# Patient Record
Sex: Female | Born: 1995 | Race: Black or African American | Hispanic: No | Marital: Single | Smoking: Former smoker
Health system: Southern US, Community
[De-identification: ages and names within clinical notes are randomized; demographics above are authoritative.]

## PROBLEM LIST (undated history)

## (undated) DIAGNOSIS — R569 Unspecified convulsions: Secondary | ICD-10-CM

## (undated) DIAGNOSIS — T7421XA Adult sexual abuse, confirmed, initial encounter: Secondary | ICD-10-CM

## (undated) DIAGNOSIS — R6250 Unspecified lack of expected normal physiological development in childhood: Secondary | ICD-10-CM

## (undated) DIAGNOSIS — F71 Moderate intellectual disabilities: Secondary | ICD-10-CM

## (undated) DIAGNOSIS — K219 Gastro-esophageal reflux disease without esophagitis: Secondary | ICD-10-CM

## (undated) DIAGNOSIS — F4310 Post-traumatic stress disorder, unspecified: Secondary | ICD-10-CM

## (undated) DIAGNOSIS — F319 Bipolar disorder, unspecified: Secondary | ICD-10-CM

## (undated) DIAGNOSIS — R456 Violent behavior: Secondary | ICD-10-CM

## (undated) DIAGNOSIS — R0600 Dyspnea, unspecified: Secondary | ICD-10-CM

## (undated) DIAGNOSIS — F32A Depression, unspecified: Secondary | ICD-10-CM

## (undated) DIAGNOSIS — Z8489 Family history of other specified conditions: Secondary | ICD-10-CM

## (undated) DIAGNOSIS — F909 Attention-deficit hyperactivity disorder, unspecified type: Secondary | ICD-10-CM

## (undated) DIAGNOSIS — I10 Essential (primary) hypertension: Secondary | ICD-10-CM

## (undated) DIAGNOSIS — R011 Cardiac murmur, unspecified: Secondary | ICD-10-CM

## (undated) DIAGNOSIS — R519 Headache, unspecified: Secondary | ICD-10-CM

## (undated) DIAGNOSIS — F419 Anxiety disorder, unspecified: Secondary | ICD-10-CM

## (undated) HISTORY — DX: Adult sexual abuse, confirmed, initial encounter: T74.21XA

## (undated) HISTORY — DX: Unspecified convulsions: R56.9

## (undated) HISTORY — DX: Depression, unspecified: F32.A

## (undated) HISTORY — DX: Post-traumatic stress disorder, unspecified: F43.10

## (undated) HISTORY — DX: Violent behavior: R45.6

## (undated) HISTORY — DX: Unspecified lack of expected normal physiological development in childhood: R62.50

---
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
## 2016-01-21 ENCOUNTER — Other Ambulatory Visit: Payer: Self-pay | Admitting: Family Medicine

## 2016-02-02 ENCOUNTER — Ambulatory Visit (HOSPITAL_BASED_OUTPATIENT_CLINIC_OR_DEPARTMENT_OTHER): Payer: Medicaid Other | Attending: Family Medicine | Admitting: Internal Medicine

## 2016-02-02 DIAGNOSIS — R0683 Snoring: Secondary | ICD-10-CM

## 2016-02-09 ENCOUNTER — Encounter (HOSPITAL_BASED_OUTPATIENT_CLINIC_OR_DEPARTMENT_OTHER): Payer: Self-pay
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
## 2016-02-11 NOTE — Procedures (Signed)
  Patient Name: Lisa [name], Lisa [name] Study Date: 02/02/2016 Gender: Female D.O.B: 03-15-1995 Age (years): 20 ReferrinGaspar Colag Provider: Payton MccallumJeffrey Walden Height (inches): 62 Interpreting Physician: Jetty Duhamellinton Skylyn Slezak MD, ABSM Weight (lbs): 221 RPSGT: Rolene ArbourMcConnico, Yvonne BMI: 40 MRN: [id number] Neck Size: 15.00 CLINICAL INFORMATION Sleep Study Type: NPSG  Indication for sleep study: Snoring  Epworth Sleepiness Score: 22  SLEEP STUDY TECHNIQUE As per the AASM Manual for the Scoring of Sleep and Associated Events v2.3 (April 2016) with a hypopnea requiring 4% desaturations.  The channels recorded and monitored were frontal, central and occipital EEG, electrooculogram (EOG), submentalis EMG (chin), nasal and oral airflow, thoracic and abdominal wall motion, anterior tibialis EMG, snore microphone, electrocardiogram, and pulse oximetry.  MEDICATIONS Medications self-administered by patient taken the night of the study : none reported  SLEEP ARCHITECTURE The study was initiated at 9:25:17 PM and ended at 4:34:14 AM.  Sleep onset time was 10.8 minutes and the sleep efficiency was 90.5%. The total sleep time was 388.1 minutes.  Stage REM latency was 283.0 minutes.  The patient spent 3.74% of the night in stage N1 sleep, 86.86% in stage N2 sleep, 2.32% in stage N3 and 7.09% in REM.  Alpha intrusion was absent.  Supine sleep was 77.84%.  RESPIRATORY PARAMETERS The overall apnea/hypopnea index (AHI) was 0.5 per hour. There were 0 total apneas, including 0 obstructive, 0 central and 0 mixed apneas. There were 3 hypopneas and 1 RERAs.  The AHI during Stage REM sleep was 2.2 per hour.  AHI while supine was 0.6 per hour.  The mean oxygen saturation was 97.86%. The minimum SpO2 during sleep was 90.00%.  Soft snoring was noted during this study.  CARDIAC DATA The 2 lead EKG demonstrated sinus rhythm. The mean heart rate was 82.37 beats per minute. Other EKG findings include: None. LEG MOVEMENT  DATA The total PLMS were 0 with a resulting PLMS index of 0.00. Associated arousal with leg movement index was 0.0 .  IMPRESSIONS - No significant obstructive sleep apnea occurred during this study (AHI = 0.5/h). - No significant central sleep apnea occurred during this study (CAI = 0.0/h). - Mild oxygen desaturation was noted during this study (Min O2 = 90.00%). - The patient snored with Soft snoring volume. - No cardiac abnormalities were noted during this study. - Clinically significant periodic limb movements did not occur during sleep. No significant associated arousals.  DIAGNOSIS - Normal study  RECOMMENDATIONS - Avoid alcohol, sedatives and other CNS depressants that may worsen sleep apnea and disrupt normal sleep architecture. - Sleep hygiene should be reviewed to assess factors that may improve sleep quality. - Weight management and regular exercise should be initiated or continued if appropriate.  [Electronically signed] 02/11/2016 10:36 AM  Jetty Duhamellinton Crixus Mcaulay MD, ABSM Diplomate, American Board of Sleep Medicine   NPI: [id number]  Waymon BudgeYOUNG,Nakima Fluegge D Diplomate, American Board of Sleep Medicine  ELECTRONICALLY SIGNED ON:  02/11/2016, 10:34 AM [facility] PH: [phone]   FX: [phone] ACCREDITED BY THE AMERICAN ACADEMY OF SLEEP MEDICINE

## 2016-02-12 DIAGNOSIS — R0683 Snoring: Secondary | ICD-10-CM | POA: Diagnosis not present

## 2016-02-13 ENCOUNTER — Ambulatory Visit (INDEPENDENT_AMBULATORY_CARE_PROVIDER_SITE_OTHER): Payer: Medicaid Other | Admitting: Psychiatry

## 2016-02-13 VITALS — BP 126/74 | HR 102 | Ht 62.0 in | Wt 232.0 lb

## 2016-02-13 DIAGNOSIS — Z79899 Other long term (current) drug therapy: Secondary | ICD-10-CM

## 2016-02-13 DIAGNOSIS — F4310 Post-traumatic stress disorder, unspecified: Secondary | ICD-10-CM | POA: Diagnosis not present

## 2016-02-13 DIAGNOSIS — Z91013 Allergy to seafood: Secondary | ICD-10-CM

## 2016-02-13 DIAGNOSIS — Z825 Family history of asthma and other chronic lower respiratory diseases: Secondary | ICD-10-CM | POA: Diagnosis not present

## 2016-02-13 DIAGNOSIS — F331 Major depressive disorder, recurrent, moderate: Secondary | ICD-10-CM

## 2016-02-13 DIAGNOSIS — Z9889 Other specified postprocedural states: Secondary | ICD-10-CM | POA: Diagnosis not present

## 2016-02-13 DIAGNOSIS — Z888 Allergy status to other drugs, medicaments and biological substances status: Secondary | ICD-10-CM

## 2016-02-13 MED ORDER — QUETIAPINE FUMARATE 25 MG PO TABS: ORAL_TABLET | ORAL | 1 refills | Status: DC

## 2016-02-13 MED ORDER — SERTRALINE HCL 100 MG PO TABS: 100.0000 mg | ORAL_TABLET | Freq: Every day | ORAL | 1 refills | Status: DC

## 2016-02-13 NOTE — Patient Instructions (Addendum)
1. Increase sertraline 100 mg daily 2. Decrease quetiapine 25 mg at night and daily as needed for anxiety, agitation 3. Return to clinic in one month

## 2016-02-28 ENCOUNTER — Ambulatory Visit (INDEPENDENT_AMBULATORY_CARE_PROVIDER_SITE_OTHER): Payer: Medicaid Other | Admitting: Family Medicine

## 2016-02-28 ENCOUNTER — Encounter: Payer: Self-pay | Admitting: Family Medicine

## 2016-02-28 VITALS — BP 110/64 | HR 93 | Temp 98.1°F | Ht 62.0 in | Wt 229.8 lb

## 2016-02-28 DIAGNOSIS — R32 Unspecified urinary incontinence: Secondary | ICD-10-CM | POA: Diagnosis not present

## 2016-02-28 DIAGNOSIS — R0683 Snoring: Secondary | ICD-10-CM | POA: Diagnosis present

## 2016-02-28 DIAGNOSIS — F339 Major depressive disorder, recurrent, unspecified: Secondary | ICD-10-CM | POA: Diagnosis not present

## 2016-02-28 MED ORDER — IBUPROFEN 600 MG PO TABS: 600.0000 mg | ORAL_TABLET | Freq: Three times a day (TID) | ORAL | 2 refills | Status: DC | PRN

## 2016-02-28 MED ORDER — ALBUTEROL SULFATE HFA 108 (90 BASE) MCG/ACT IN AERS: 2.0000 | INHALATION_SPRAY | Freq: Four times a day (QID) | RESPIRATORY_TRACT | 1 refills | Status: DC | PRN

## 2016-02-28 NOTE — Patient Instructions (Signed)
Take the Ibuprofen every 8 hours as needed for your ankle.  Also use ice.  Let me know if it's not better in the next week or so.    Use the Albuterol at night if you're having trouble breathing.  It was very good to see you today!
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
## 2016-03-15 ENCOUNTER — Other Ambulatory Visit (HOSPITAL_COMMUNITY): Payer: Self-pay | Admitting: Family Medicine

## 2016-03-19 ENCOUNTER — Ambulatory Visit (HOSPITAL_COMMUNITY): Payer: Self-pay | Admitting: Psychiatry

## 2016-03-20 ENCOUNTER — Ambulatory Visit (HOSPITAL_COMMUNITY): Payer: Self-pay | Admitting: Psychiatry

## 2016-03-26 ENCOUNTER — Ambulatory Visit: Payer: Medicaid Other | Admitting: Family Medicine

## 2016-03-27 ENCOUNTER — Ambulatory Visit: Payer: Medicaid Other | Admitting: Family Medicine

## 2016-06-14 ENCOUNTER — Other Ambulatory Visit (HOSPITAL_COMMUNITY): Payer: Self-pay | Admitting: Psychiatry

## 2016-06-14 ENCOUNTER — Telehealth (HOSPITAL_COMMUNITY): Payer: Self-pay | Admitting: *Deleted

## 2016-06-14 MED ORDER — SERTRALINE HCL 100 MG PO TABS: 100.0000 mg | ORAL_TABLET | Freq: Every day | ORAL | 0 refills | Status: DC

## 2016-06-14 NOTE — Telephone Encounter (Signed)
noted 

## 2016-06-14 NOTE — Telephone Encounter (Signed)
[facility] [place] [facility] request for pt Sertraline HCL 100 mg QD. Pt medication last filled on 02-13-2016 with 30 tabs 1 refill. Pt last appt was 02-13-2016. Per pt chart, it looks like she resch appt with Dr. Rene KocherEksir but she no showed and never resch. Called pt number on file to see which provider she was going to resch with but number is d/c or not in service because call is not going through.

## 2016-06-14 NOTE — Telephone Encounter (Signed)
Ordered only for a month. No more refill unless she has a follow up appointment, fyi.

## 2016-06-18 ENCOUNTER — Telehealth: Payer: Self-pay | Admitting: Family Medicine

## 2016-06-18 DIAGNOSIS — F71 Moderate intellectual disabilities: Secondary | ICD-10-CM

## 2016-06-18 DIAGNOSIS — F331 Major depressive disorder, recurrent, moderate: Secondary | ICD-10-CM

## 2016-06-18 DIAGNOSIS — F4310 Post-traumatic stress disorder, unspecified: Secondary | ICD-10-CM

## 2016-06-18 NOTE — Telephone Encounter (Signed)
Pt needs refills on epipen, albutrol inhaler and solution, epi pen, abilify, zyurtec, flonase, singular, omeprazole and seraquel.  [facility] [address]

## 2016-06-19 MED ORDER — CETIRIZINE HCL 10 MG PO TABS: 10.0000 mg | ORAL_TABLET | Freq: Every day | ORAL | 11 refills | Status: DC

## 2016-06-19 MED ORDER — FLUTICASONE PROPIONATE 50 MCG/ACT NA SUSP: NASAL | 2 refills | Status: DC

## 2016-06-19 MED ORDER — ARIPIPRAZOLE 20 MG PO TABS: 20.0000 mg | ORAL_TABLET | Freq: Every day | ORAL | 1 refills | Status: DC

## 2016-06-19 MED ORDER — ALBUTEROL SULFATE HFA 108 (90 BASE) MCG/ACT IN AERS: 2.0000 | INHALATION_SPRAY | Freq: Four times a day (QID) | RESPIRATORY_TRACT | 1 refills | Status: DC | PRN

## 2016-06-19 MED ORDER — OMEPRAZOLE 40 MG PO CPDR: 40.0000 mg | DELAYED_RELEASE_CAPSULE | Freq: Every morning | ORAL | 6 refills | Status: DC

## 2016-06-19 MED ORDER — EPINEPHRINE 0.3 MG/0.3ML IJ SOAJ: 0.3000 mg | Freq: Once | INTRAMUSCULAR | 2 refills | Status: DC

## 2016-06-19 MED ORDER — MONTELUKAST SODIUM 10 MG PO TABS: 10.0000 mg | ORAL_TABLET | Freq: Every day | ORAL | 3 refills | Status: DC

## 2016-06-19 MED ORDER — ALBUTEROL SULFATE (2.5 MG/3ML) 0.083% IN NEBU: 2.5000 mg | INHALATION_SOLUTION | Freq: Four times a day (QID) | RESPIRATORY_TRACT | 6 refills | Status: DC | PRN

## 2016-07-06 ENCOUNTER — Emergency Department (HOSPITAL_COMMUNITY)
Admission: EM | Admit: 2016-07-06 | Discharge: 2016-07-06 | Disposition: A | Discharge status: Home / Self Care | Payer: Medicaid Other | Attending: Emergency Medicine | Admitting: Emergency Medicine

## 2016-07-06 ENCOUNTER — Encounter (HOSPITAL_COMMUNITY): Payer: Self-pay | Admitting: Emergency Medicine

## 2016-07-06 ENCOUNTER — Emergency Department (HOSPITAL_COMMUNITY): Payer: Medicaid Other

## 2016-07-06 DIAGNOSIS — Y939 Activity, unspecified: Secondary | ICD-10-CM | POA: Insufficient documentation

## 2016-07-06 DIAGNOSIS — X501XXA Overexertion from prolonged static or awkward postures, initial encounter: Secondary | ICD-10-CM | POA: Diagnosis not present

## 2016-07-06 DIAGNOSIS — Z79899 Other long term (current) drug therapy: Secondary | ICD-10-CM | POA: Insufficient documentation

## 2016-07-06 DIAGNOSIS — F909 Attention-deficit hyperactivity disorder, unspecified type: Secondary | ICD-10-CM | POA: Insufficient documentation

## 2016-07-06 DIAGNOSIS — Y929 Unspecified place or not applicable: Secondary | ICD-10-CM | POA: Diagnosis not present

## 2016-07-06 DIAGNOSIS — J45909 Unspecified asthma, uncomplicated: Secondary | ICD-10-CM | POA: Insufficient documentation

## 2016-07-06 DIAGNOSIS — S99911A Unspecified injury of right ankle, initial encounter: Secondary | ICD-10-CM | POA: Diagnosis present

## 2016-07-06 DIAGNOSIS — Y999 Unspecified external cause status: Secondary | ICD-10-CM | POA: Insufficient documentation

## 2016-07-06 DIAGNOSIS — R569 Unspecified convulsions: Secondary | ICD-10-CM | POA: Diagnosis not present

## 2016-07-06 DIAGNOSIS — S93401A Sprain of unspecified ligament of right ankle, initial encounter: Secondary | ICD-10-CM | POA: Diagnosis not present

## 2016-07-06 LAB — I-STAT CHEM 8, ED
BUN: 14 mg/dL (ref 6–20)
CALCIUM ION: 1.17 mmol/L (ref 1.15–1.40)
CHLORIDE: 104 mmol/L (ref 101–111)
Creatinine, Ser: 0.8 mg/dL (ref 0.44–1.00)
Glucose, Bld: 95 mg/dL (ref 65–99)
HCT: 42 % (ref 36.0–46.0)
Hemoglobin: 14.3 g/dL (ref 12.0–15.0)
POTASSIUM: 3.7 mmol/L (ref 3.5–5.1)
SODIUM: 140 mmol/L (ref 135–145)
TCO2: 22 mmol/L (ref 0–100)

## 2016-07-06 LAB — I-STAT BETA HCG BLOOD, ED (MC, WL, AP ONLY)

## 2016-07-06 MED ORDER — IBUPROFEN 800 MG PO TABS
800.0000 mg | ORAL_TABLET | Freq: Once | ORAL | Status: AC
  Administered 2016-07-06: 800 mg via ORAL
  Filled 2016-07-06: qty 1

## 2016-07-06 NOTE — ED Triage Notes (Signed)
Pt presents to ED for assessment of right foot pain after she twisted in her siblings room.  Pt also c/o of upper throat/chest pain, denies radiation, denies any other associated symptoms.  Hx of MR with a hx of anxiety with similar symptoms, and GERD, according to mom.  EKG done, no blood work at this time.
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
## 2016-07-06 NOTE — Discharge Instructions (Signed)
We recommend taking Tylenol or ibuprofen for persistent ankle pain. Wear an ankle brace for stability. You may follow-up with your primary care doctor as needed. Continue follow-up with Dr. Sharene SkeansHickling regarding your seizure-like activity. You may return to the emergency department, as needed, for new or concerning symptoms.

## 2016-07-06 NOTE — ED Notes (Signed)
Patient transported to X-ray 

## 2016-07-06 NOTE — ED Notes (Signed)
ED Provider at bedside. 

## 2016-07-27 ENCOUNTER — Ambulatory Visit: Payer: Self-pay | Admitting: Family Medicine

## 2016-07-30 ENCOUNTER — Ambulatory Visit (INDEPENDENT_AMBULATORY_CARE_PROVIDER_SITE_OTHER): Payer: Medicaid Other | Admitting: Family Medicine

## 2016-07-30 ENCOUNTER — Encounter: Payer: Self-pay | Admitting: Family Medicine

## 2016-07-30 VITALS — BP 98/62 | HR 88 | Temp 98.1°F | Ht 62.0 in | Wt 234.2 lb

## 2016-07-30 DIAGNOSIS — G40A09 Absence epileptic syndrome, not intractable, without status epilepticus: Secondary | ICD-10-CM

## 2016-07-30 DIAGNOSIS — R569 Unspecified convulsions: Secondary | ICD-10-CM

## 2016-07-30 DIAGNOSIS — G8929 Other chronic pain: Secondary | ICD-10-CM | POA: Diagnosis not present

## 2016-07-30 DIAGNOSIS — F28 Other psychotic disorder not due to a substance or known physiological condition: Secondary | ICD-10-CM

## 2016-07-30 DIAGNOSIS — M25571 Pain in right ankle and joints of right foot: Secondary | ICD-10-CM

## 2016-07-30 DIAGNOSIS — Z3009 Encounter for other general counseling and advice on contraception: Secondary | ICD-10-CM

## 2016-07-30 MED ORDER — MEDROXYPROGESTERONE ACETATE 150 MG/ML IM SUSY
150.0000 mg | PREFILLED_SYRINGE | Freq: Once | INTRAMUSCULAR | Status: AC
  Administered 2016-07-30: 150 mg via INTRAMUSCULAR

## 2016-07-30 MED ORDER — MEDROXYPROGESTERONE ACETATE 150 MG/ML IM SUSP: 150.0000 mg | Freq: Once | INTRAMUSCULAR | Status: DC
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
## 2016-07-30 NOTE — Patient Instructions (Signed)
Call Dr. Darl HouseholderHickling's office for Lisa [name]'s staring episodes.   Address: [address], [place], [place] [address] Phone: [phone]  Call Behavioral Health to schedule a follow-up appt to discuss shots for her: [address], [place], [place] [address] Phone: [phone]  Make sure she is doing her ankle exercises 10 times 3 times a day

## 2016-08-01 NOTE — Assessment & Plan Note (Signed)
Recommended FU with psych in next month

## 2016-08-01 NOTE — Assessment & Plan Note (Signed)
Gave her physical therapy exercises.  Brace in place today.  She hasn't been doing PT regularly.  Pain controlled today -- very mild.

## 2016-08-01 NOTE — Assessment & Plan Note (Signed)
Recently has had increase of staring spells similar to her seizures as a child. Has had normal EEGs during pseudoseizure activity.  Unclear if EEG during staring episode.   ED recommended FU with Dr. Sharene SkeansHickling.  I agree.  Gave mom his office number and recommendations to follow up

## 2016-08-01 NOTE — Assessment & Plan Note (Signed)
These have also increased.  Originally better with antipsychotics.   To continue with safe environments as mom has been.  Mom is stressed because she cannot hold down a job due to [employer]'s frequent pseudoseizures.

## 2016-08-07 ENCOUNTER — Telehealth (HOSPITAL_COMMUNITY): Payer: Self-pay | Admitting: *Deleted

## 2016-08-07 NOTE — Telephone Encounter (Signed)
[facility] [address] [facility] office refill request for pt Zoloft. Pt medication was last filled on 06-14-2016 with 30 tabs 0 refill. Pt was last seen on 02-13-2016. Pt pharmacy number is [phone].

## 2016-08-08 ENCOUNTER — Telehealth: Payer: Self-pay | Admitting: *Deleted

## 2016-08-08 MED ORDER — FLUTICASONE PROPIONATE HFA 44 MCG/ACT IN AERO: 2.0000 | INHALATION_SPRAY | Freq: Two times a day (BID) | RESPIRATORY_TRACT | 1 refills | Status: DC

## 2016-08-08 NOTE — Telephone Encounter (Signed)
Switch made.  

## 2016-08-08 NOTE — Telephone Encounter (Signed)
As in previous message, will not prescribe any more given patient has not been seen since Feb. Please notify the pharmacy as well.

## 2016-08-08 NOTE — Addendum Note (Signed)
Addended byGwendolyn Grant: WALDEN, Newt LukesJEFFREY H on: 08/08/2016 02:42 PM   Modules accepted: Orders

## 2016-08-08 NOTE — Telephone Encounter (Signed)
[facility] requesting to [facility] Qvar to Flovent.  Qvar is no longer available.  Clovis PuMartin, Tamika L, RN

## 2016-08-13 ENCOUNTER — Other Ambulatory Visit (HOSPITAL_COMMUNITY): Payer: Self-pay | Admitting: Family Medicine

## 2016-08-13 NOTE — Telephone Encounter (Signed)
[facility]ed [facility] to call office to sch appt to be seen due to provider request. Form was faxed to [phone].

## 2016-10-22 ENCOUNTER — Ambulatory Visit: Payer: Self-pay | Admitting: Family Medicine

## 2016-10-22 ENCOUNTER — Telehealth: Payer: Self-pay | Admitting: *Deleted
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
## 2016-10-23 MED ORDER — ALBUTEROL SULFATE HFA 108 (90 BASE) MCG/ACT IN AERS: 2.0000 | INHALATION_SPRAY | Freq: Four times a day (QID) | RESPIRATORY_TRACT | 1 refills | Status: DC | PRN

## 2016-10-23 MED ORDER — MONTELUKAST SODIUM 10 MG PO TABS: 10.0000 mg | ORAL_TABLET | Freq: Every day | ORAL | 3 refills | Status: DC

## 2016-10-23 NOTE — Telephone Encounter (Signed)
I am covering for Dr. Gwendolyn Grant who is away from the office.  Sent in refill on albuterol and singulair. Please let patient know. Thanks,  Latrelle Dodrill, MD

## 2016-10-30 NOTE — Telephone Encounter (Signed)
Attempted to call pt, no voicemail set up. Will try again later. Leanthony Rhett, CMA  

## 2016-11-14 ENCOUNTER — Ambulatory Visit: Payer: Medicaid Other | Admitting: Internal Medicine

## 2016-11-14 ENCOUNTER — Ambulatory Visit (HOSPITAL_COMMUNITY)
Admission: EM | Admit: 2016-11-14 | Discharge: 2016-11-14 | Disposition: A | Discharge status: Home / Self Care | Payer: Medicaid Other | Attending: Family Medicine | Admitting: Family Medicine

## 2016-11-14 ENCOUNTER — Encounter (HOSPITAL_COMMUNITY): Payer: Self-pay | Admitting: Family Medicine

## 2016-11-14 DIAGNOSIS — M79609 Pain in unspecified limb: Secondary | ICD-10-CM

## 2016-11-14 DIAGNOSIS — F411 Generalized anxiety disorder: Secondary | ICD-10-CM

## 2016-11-14 NOTE — ED Triage Notes (Signed)
Pt reporting left chest pain and left arm numbness intermittent since Saturday.
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
## 2016-11-19 ENCOUNTER — Ambulatory Visit (INDEPENDENT_AMBULATORY_CARE_PROVIDER_SITE_OTHER): Payer: Medicaid Other | Admitting: Family Medicine

## 2016-11-19 ENCOUNTER — Encounter: Payer: Self-pay | Admitting: Family Medicine

## 2016-11-19 VITALS — BP 100/72 | HR 65 | Temp 98.1°F | Ht 62.0 in | Wt 233.6 lb

## 2016-11-19 DIAGNOSIS — R030 Elevated blood-pressure reading, without diagnosis of hypertension: Secondary | ICD-10-CM

## 2016-11-19 DIAGNOSIS — M79671 Pain in right foot: Secondary | ICD-10-CM | POA: Diagnosis not present

## 2016-11-19 MED ORDER — ALBUTEROL SULFATE (2.5 MG/3ML) 0.083% IN NEBU: 2.5000 mg | INHALATION_SOLUTION | Freq: Four times a day (QID) | RESPIRATORY_TRACT | 6 refills | Status: DC | PRN

## 2016-11-19 MED ORDER — CETIRIZINE HCL 10 MG PO TABS: 10.0000 mg | ORAL_TABLET | Freq: Every day | ORAL | 11 refills | Status: DC

## 2016-11-19 MED ORDER — ARIPIPRAZOLE 20 MG PO TABS: 20.0000 mg | ORAL_TABLET | Freq: Every day | ORAL | 1 refills | Status: DC

## 2016-11-19 MED ORDER — QUETIAPINE FUMARATE 25 MG PO TABS: ORAL_TABLET | ORAL | 1 refills | Status: DC

## 2016-11-19 MED ORDER — ALBUTEROL SULFATE HFA 108 (90 BASE) MCG/ACT IN AERS: 2.0000 | INHALATION_SPRAY | Freq: Four times a day (QID) | RESPIRATORY_TRACT | 1 refills | Status: DC | PRN

## 2016-11-19 MED ORDER — FLUTICASONE PROPIONATE HFA 44 MCG/ACT IN AERO: 2.0000 | INHALATION_SPRAY | Freq: Two times a day (BID) | RESPIRATORY_TRACT | 1 refills | Status: DC
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
## 2016-12-04 ENCOUNTER — Ambulatory Visit (HOSPITAL_COMMUNITY)
Admission: RE | Admit: 2016-12-04 | Discharge: 2016-12-04 | Disposition: A | Discharge status: Home / Self Care | Payer: Medicaid Other | Source: Ambulatory Visit | Attending: Family Medicine | Admitting: Family Medicine

## 2016-12-04 DIAGNOSIS — M79671 Pain in right foot: Secondary | ICD-10-CM | POA: Diagnosis present

## 2016-12-15 ENCOUNTER — Encounter (HOSPITAL_COMMUNITY): Payer: Self-pay | Admitting: Emergency Medicine

## 2016-12-15 ENCOUNTER — Other Ambulatory Visit: Payer: Self-pay

## 2016-12-15 ENCOUNTER — Emergency Department (HOSPITAL_COMMUNITY)
Admission: EM | Admit: 2016-12-15 | Discharge: 2016-12-16 | Disposition: A | Discharge status: Home / Self Care | Payer: No Typology Code available for payment source | Attending: Emergency Medicine | Admitting: Emergency Medicine

## 2016-12-15 ENCOUNTER — Emergency Department (HOSPITAL_COMMUNITY): Payer: No Typology Code available for payment source

## 2016-12-15 DIAGNOSIS — J45909 Unspecified asthma, uncomplicated: Secondary | ICD-10-CM | POA: Diagnosis not present

## 2016-12-15 DIAGNOSIS — M25572 Pain in left ankle and joints of left foot: Secondary | ICD-10-CM | POA: Diagnosis not present

## 2016-12-15 DIAGNOSIS — Y999 Unspecified external cause status: Secondary | ICD-10-CM | POA: Insufficient documentation

## 2016-12-15 DIAGNOSIS — Y92410 Unspecified street and highway as the place of occurrence of the external cause: Secondary | ICD-10-CM | POA: Diagnosis not present

## 2016-12-15 DIAGNOSIS — Z79899 Other long term (current) drug therapy: Secondary | ICD-10-CM | POA: Diagnosis not present

## 2016-12-15 DIAGNOSIS — R569 Unspecified convulsions: Secondary | ICD-10-CM | POA: Diagnosis not present

## 2016-12-15 DIAGNOSIS — R51 Headache: Secondary | ICD-10-CM | POA: Insufficient documentation

## 2016-12-15 DIAGNOSIS — R110 Nausea: Secondary | ICD-10-CM | POA: Insufficient documentation

## 2016-12-15 DIAGNOSIS — M542 Cervicalgia: Secondary | ICD-10-CM | POA: Diagnosis not present

## 2016-12-15 DIAGNOSIS — Y9389 Activity, other specified: Secondary | ICD-10-CM | POA: Insufficient documentation

## 2016-12-15 DIAGNOSIS — F71 Moderate intellectual disabilities: Secondary | ICD-10-CM | POA: Insufficient documentation

## 2016-12-15 LAB — BASIC METABOLIC PANEL
Anion gap: 6 (ref 5–15)
BUN: 11 mg/dL (ref 6–20)
CALCIUM: 8.9 mg/dL (ref 8.9–10.3)
CO2: 24 mmol/L (ref 22–32)
CREATININE: 0.77 mg/dL (ref 0.44–1.00)
Chloride: 106 mmol/L (ref 101–111)
GFR calc Af Amer: >60 mL/min (ref >60)
Glucose, Bld: 115 mg/dL — ABNORMAL HIGH (ref 65–99)
Potassium: 3.5 mmol/L (ref 3.5–5.1)
SODIUM: 136 mmol/L (ref 135–145)

## 2016-12-15 LAB — CBC WITH DIFFERENTIAL/PLATELET
Basophils Absolute: 0 10*3/uL (ref 0.0–0.1)
Basophils Relative: 0 %
EOS ABS: 0.1 10*3/uL (ref 0.0–0.7)
Eosinophils Relative: 1 %
HCT: 35.7 % — ABNORMAL LOW (ref 36.0–46.0)
HEMOGLOBIN: 11.9 g/dL — AB (ref 12.0–15.0)
LYMPHS PCT: 44 %
Lymphs Abs: 3.4 10*3/uL (ref 0.7–4.0)
MCH: 26.2 pg (ref 26.0–34.0)
MCHC: 33.3 g/dL (ref 30.0–36.0)
MCV: 78.6 fL (ref 78.0–100.0)
MONOS PCT: 7 %
Monocytes Absolute: 0.5 10*3/uL (ref 0.1–1.0)
NEUTROS PCT: 48 %
Neutro Abs: 3.8 10*3/uL (ref 1.7–7.7)
Platelets: 225 10*3/uL (ref 150–400)
RBC: 4.54 MIL/uL (ref 3.87–5.11)
RDW: 14 % (ref 11.5–15.5)
WBC: 7.9 10*3/uL (ref 4.0–10.5)

## 2016-12-15 LAB — MAGNESIUM: MAGNESIUM: 1.6 mg/dL — AB (ref 1.7–2.4)

## 2016-12-15 LAB — I-STAT BETA HCG BLOOD, ED (MC, WL, AP ONLY)

## 2016-12-15 MED ORDER — LORAZEPAM 2 MG/ML IJ SOLN
1.0000 mg | Freq: Once | INTRAMUSCULAR | Status: AC
  Administered 2016-12-15: 1 mg via INTRAVENOUS
  Filled 2016-12-15: qty 1

## 2016-12-15 MED ORDER — SODIUM CHLORIDE 0.9 % IV BOLUS (SEPSIS)
500.0000 mL | Freq: Once | INTRAVENOUS | Status: AC
  Administered 2016-12-15: 500 mL via INTRAVENOUS

## 2016-12-15 MED ORDER — ONDANSETRON 4 MG PO TBDP
4.0000 mg | ORAL_TABLET | Freq: Once | ORAL | Status: AC
  Administered 2016-12-15: 4 mg via ORAL
  Filled 2016-12-15: qty 1
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
## 2016-12-15 NOTE — ED Notes (Signed)
Called by staff in CT to come check on patient.  Staff reports she had seizure like activity for a few seconds.  Was able to talk to staff during episode.
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
## 2016-12-15 NOTE — ED Notes (Signed)
Taken to xray.

## 2016-12-20 ENCOUNTER — Ambulatory Visit (INDEPENDENT_AMBULATORY_CARE_PROVIDER_SITE_OTHER): Payer: Medicaid Other | Admitting: Internal Medicine

## 2016-12-20 ENCOUNTER — Encounter: Payer: Self-pay | Admitting: Internal Medicine

## 2016-12-20 VITALS — BP 110/60 | HR 95 | Temp 98.4°F | Ht 62.0 in | Wt 241.0 lb

## 2016-12-20 DIAGNOSIS — M791 Myalgia, unspecified site: Secondary | ICD-10-CM | POA: Diagnosis not present

## 2016-12-20 DIAGNOSIS — Z23 Encounter for immunization: Secondary | ICD-10-CM | POA: Diagnosis not present

## 2016-12-20 NOTE — Patient Instructions (Signed)
Follow up in clinic on Monday or Tuesday.  Please call Dr. Sharene SkeansHickling to make a follow up appointment.
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
## 2016-12-20 NOTE — Progress Notes (Signed)
Of note, CN 2-12 normal on exam.

## 2016-12-24 ENCOUNTER — Other Ambulatory Visit: Payer: Self-pay

## 2016-12-24 ENCOUNTER — Ambulatory Visit (INDEPENDENT_AMBULATORY_CARE_PROVIDER_SITE_OTHER): Payer: Medicaid Other | Admitting: Family Medicine

## 2016-12-24 ENCOUNTER — Encounter: Payer: Self-pay | Admitting: Family Medicine

## 2016-12-24 VITALS — BP 116/64 | HR 96 | Temp 98.5°F | Ht 62.0 in | Wt 240.8 lb

## 2016-12-24 DIAGNOSIS — S060X9A Concussion with loss of consciousness of unspecified duration, initial encounter: Secondary | ICD-10-CM | POA: Diagnosis not present

## 2016-12-24 DIAGNOSIS — G40909 Epilepsy, unspecified, not intractable, without status epilepticus: Secondary | ICD-10-CM | POA: Diagnosis not present

## 2016-12-24 DIAGNOSIS — R4586 Emotional lability: Secondary | ICD-10-CM

## 2016-12-24 DIAGNOSIS — F4310 Post-traumatic stress disorder, unspecified: Secondary | ICD-10-CM | POA: Diagnosis not present

## 2016-12-24 MED ORDER — IBUPROFEN 600 MG PO TABS: 600.0000 mg | ORAL_TABLET | Freq: Three times a day (TID) | ORAL | 2 refills | Status: DC | PRN
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
## 2016-12-25 DIAGNOSIS — S060X9A Concussion with loss of consciousness of unspecified duration, initial encounter: Secondary | ICD-10-CM | POA: Insufficient documentation

## 2016-12-25 NOTE — Assessment & Plan Note (Addendum)
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  Has had changes in her behavior, feelings of confusion.  She is scared because of this.  - No evidence of head bleed.  Neuro and rest of physical exam is good.  CT head was negative in ED, and she has not decompensated since.   - Reassured her this will improve with time.  Validated her experience of being scared.  She seemed to feel somewhat better after our visit. - Discussed brain rest and gradual return to normal levels of activity.  She has been resting mostly on her own already.   - FU with me on Friday to make sure she's continuing to improve
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
## 2016-12-28 ENCOUNTER — Ambulatory Visit: Payer: Medicaid Other | Admitting: Family Medicine

## 2017-01-21 ENCOUNTER — Other Ambulatory Visit: Payer: Self-pay

## 2017-01-21 ENCOUNTER — Encounter: Payer: Self-pay | Admitting: Family Medicine

## 2017-01-21 ENCOUNTER — Ambulatory Visit (INDEPENDENT_AMBULATORY_CARE_PROVIDER_SITE_OTHER): Payer: Medicaid Other | Admitting: Family Medicine

## 2017-01-21 ENCOUNTER — Other Ambulatory Visit (HOSPITAL_COMMUNITY)
Admission: RE | Admit: 2017-01-21 | Discharge: 2017-01-21 | Disposition: A | Discharge status: Home / Self Care | Payer: Medicaid Other | Source: Ambulatory Visit | Attending: Family Medicine | Admitting: Family Medicine

## 2017-01-21 VITALS — BP 120/70 | HR 88 | Temp 98.7°F | Ht 62.0 in | Wt 242.0 lb

## 2017-01-21 DIAGNOSIS — T7421XA Adult sexual abuse, confirmed, initial encounter: Secondary | ICD-10-CM

## 2017-01-21 DIAGNOSIS — Z3202 Encounter for pregnancy test, result negative: Secondary | ICD-10-CM | POA: Diagnosis not present

## 2017-01-21 DIAGNOSIS — X58XXXA Exposure to other specified factors, initial encounter: Secondary | ICD-10-CM | POA: Diagnosis not present

## 2017-01-21 DIAGNOSIS — R300 Dysuria: Secondary | ICD-10-CM | POA: Diagnosis not present

## 2017-01-21 LAB — POCT WET PREP (WET MOUNT)
CLUE CELLS WET PREP WHIFF POC: NEGATIVE
Trichomonas Wet Prep HPF POC: ABSENT

## 2017-01-21 LAB — POCT URINE PREGNANCY: Preg Test, Ur: NEGATIVE

## 2017-01-21 MED ORDER — CEPHALEXIN 500 MG PO CAPS: 500.0000 mg | ORAL_CAPSULE | Freq: Two times a day (BID) | ORAL | 0 refills | Status: DC

## 2017-01-21 NOTE — Assessment & Plan Note (Signed)
Lisa [name] appears resilient despite this most recent, traumatic setback.   - Mom states via phone that she has been having worsening pseudoseizures and extreme agoraphobia.  Lisa [name] herself states that this has started very recently to get better.  She is still easily scared by herself - STD testing performed today.  No discharge on today's exam. -I do believe she has evidence of urinary tract infection based on her symptoms.  We will treat based on symptoms and obtain urine culture.  I did not obtain a UA as she presented us with a non-clean-catch urine for the urine pregnancy test. -Surprisingly her urine pregnancy test here is negative.  We are obtaining serum beta hCG for confirmation. -I will call tomorrow with the results of the STD testing and the beta hCG testing. -I will see her back in about a week to ensure she is still doing well.  [facility]
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
## 2017-01-22 ENCOUNTER — Encounter: Payer: Self-pay | Admitting: *Deleted

## 2017-01-22 ENCOUNTER — Telehealth: Payer: Self-pay | Admitting: *Deleted

## 2017-01-22 ENCOUNTER — Encounter: Payer: Self-pay | Admitting: Family Medicine

## 2017-01-22 LAB — CBC
HEMATOCRIT: 36.9 % (ref 34.0–46.6)
HEMOGLOBIN: 12.5 g/dL (ref 11.1–15.9)
MCH: 26.3 pg — AB (ref 26.6–33.0)
MCHC: 33.9 g/dL (ref 31.5–35.7)
MCV: 78 fL — AB (ref 79–97)
Platelets: 259 10*3/uL (ref 150–379)
RBC: 4.75 x10E6/uL (ref 3.77–5.28)
RDW: 14.2 % (ref 12.3–15.4)
WBC: 7.5 10*3/uL (ref 3.4–10.8)

## 2017-01-22 LAB — HEPATITIS PANEL, ACUTE
HEP A IGM: NEGATIVE
Hep B C IgM: NEGATIVE
Hepatitis B Surface Ag: NEGATIVE

## 2017-01-22 LAB — CERVICOVAGINAL ANCILLARY ONLY
CHLAMYDIA, DNA PROBE: NEGATIVE
NEISSERIA GONORRHEA: POSITIVE — AB

## 2017-01-22 LAB — RPR: RPR: NONREACTIVE

## 2017-01-22 LAB — HIV ANTIBODY (ROUTINE TESTING W REFLEX): HIV SCREEN 4TH GENERATION: NONREACTIVE

## 2017-01-22 MED ORDER — AZITHROMYCIN 1 G PO PACK: 1.0000 g | PACK | Freq: Once | ORAL | 0 refills | Status: AC

## 2017-01-22 NOTE — Addendum Note (Signed)
Addended byGwendolyn Grant: WALDEN, Newt LukesJEFFREY H on: 01/22/2017 01:48 PM   Modules accepted: Orders

## 2017-01-22 NOTE — Telephone Encounter (Signed)
Cervicovaginal tests returned positive for gonorrhea, unfortunately.  Called and spoke with [name] (pt's mom) who will speak with [name].  They are going to come in tomorrow for 250 mg Rocephin IM injection plus 1 g azithromycin, which I will send in now.    Added beta HCG, which somehow didn't get added to labs yesterday.  Will call when results return.  TOC for gonorrhea in 1 week.

## 2017-01-22 NOTE — Telephone Encounter (Signed)
Message left on clinic nurse voice mail - Calling to check on lab results from yesterday for pregnancy test and STD testing.  STD results still pending.  [facility] serum hcg test.  Will route note to Dr. Gwendolyn GrantWalden to add on lab test if he still wants this performed.  Will call and inform patient labs still pending.  Altamese Dilling~Miri Jose, BSN, RN-BC

## 2017-01-23 ENCOUNTER — Ambulatory Visit (INDEPENDENT_AMBULATORY_CARE_PROVIDER_SITE_OTHER): Payer: Medicaid Other | Admitting: *Deleted

## 2017-01-23 DIAGNOSIS — T7421XA Adult sexual abuse, confirmed, initial encounter: Secondary | ICD-10-CM

## 2017-01-23 DIAGNOSIS — A549 Gonococcal infection, unspecified: Secondary | ICD-10-CM

## 2017-01-23 LAB — HCG, SERUM, QUALITATIVE: hCG,Beta Subunit,Qual,Serum: NEGATIVE m[IU]/mL (ref <6)

## 2017-01-23 LAB — SPECIMEN STATUS REPORT

## 2017-01-23 LAB — URINE CULTURE

## 2017-01-23 MED ORDER — CEFTRIAXONE SODIUM 250 MG IJ SOLR
250.0000 mg | Freq: Once | INTRAMUSCULAR | Status: AC
  Administered 2017-01-23: 250 mg via INTRAMUSCULAR

## 2017-01-23 MED ORDER — AZITHROMYCIN 500 MG PO TABS
1000.0000 mg | ORAL_TABLET | Freq: Once | ORAL | Status: AC
  Administered 2017-01-23: 1000 mg via ORAL

## 2017-01-23 NOTE — Patient Instructions (Signed)
Gonorrhea Gonorrhea is a sexually transmitted disease (STD) that can affect both men and women. If left untreated, this infection can:  Damage the female or female organs.  Cause women and men to be unable to have children (be sterile).  Harm a fetus if an infected woman is pregnant.  It is important to get treatment for gonorrhea as soon as possible. It is also necessary for all of your sexual partners to be tested for the infection. What are the causes? This condition is caused by bacteria called Neisseria gonorrhoeae. The infection is spread from person to person through sexual contact, including oral, anal, and vaginal sex. A newborn can contract the infection from his or her mother during birth. What increases the risk? The following factors may make you more likely to develop this condition:  Being a woman who is younger than 21 years of age and who is sexually active.  Being a woman 25 years of age or older who has: ? A new sex partner. ? More than one sex partner. ? A sex partner who has an STD.  Being a man who has: ? A new sex partner. ? More than one sex partner. ? A sex partner who has an STD.  Using condoms inconsistently.  Currently having, or having previously had, an STD.  Exchanging sex for money or drugs.  What are the signs or symptoms? Some people do not have any symptoms. If you do have symptoms, they may be different for females and males. For females  Pain in the lower abdomen.  Abnormal vaginal discharge. The discharge may be cloudy, thick, or yellow-green in color.  Bleeding between periods.  Painful sex.  Burning or itching in and around the vagina.  Pain or burning when urinating.  Irritation, pain, bleeding, or discharge from the rectum. This may occur if the infection was spread by anal sex.  Sore throat or swollen lymph nodes in the neck. This may occur if the infection was spread by oral sex. For males  Abnormal discharge from the  penis. This discharge may be cloudy, thick, or yellow-green in color.  Pain or burning during urination.  Pain or swelling in the testicles.  Irritation, pain, bleeding, or discharge from the rectum. This may occur if the infection was spread by anal sex.  Sore throat, fever, or swollen lymph nodes in the neck. This may occur if the infection was spread by oral sex. How is this diagnosed? This condition is diagnosed based on:  A physical exam.  A sample of discharge that is examined under a microscope to look for the bacteria. The discharge may be taken from the urethra, cervix, throat, or rectum.  Urine tests.  Not all of test results will be available during your visit. How is this treated? This condition is treated with antibiotic medicines. It is important for treatment to begin as soon as possible. Early treatment may prevent some problems from developing. Do not have sex during treatment. Avoid all types of sexual activity for 7 days after treatment is complete and until any sex partners have been treated. Follow these instructions at home:  Take over-the-counter and prescription medicines only as told by your health care provider.  Take your antibiotic medicine as told by your health care provider. Do not stop taking the antibiotic even if you start to feel better.  Do not have sex until at least 7 days after you and your partner(s) have finished treatment and your health care provider says   it is okay.  It is your responsibility to get your test results. Ask your health care provider, or the department performing the test, when your results will be ready.  If you test positive for gonorrhea, inform your recent sexual partners. This includes any oral, anal, or vaginal sex partners. They need to be checked for gonorrhea even if they do not have symptoms. They may need treatment, even if they test negative for gonorrhea.  Keep all follow-up visits as told by your health care  provider. This is important. How is this prevented?  Use latex condoms correctly every time you have sexual intercourse.  Ask if your sexual partner has been tested for STDs and had negative results.  Avoid having multiple sexual partners. Contact a health care provider if:  You develop a bad reaction to the medicine you were prescribed. This may include: ? A rash. ? Nausea. ? Vomiting. ? Diarrhea.  Your symptoms do not get better after a few days of taking antibiotics.  Your symptoms get worse.  You develop new symptoms.  Your pain gets worse.  You have a fever.  You develop pain, itching, or discharge around the eyes. Get help right away if:  You feel dizzy or faint.  You have trouble breathing or have shortness of breath.  You develop an irregular heartbeat.  You have severe abdominal pain with or without shoulder pain.  You develop any bumps or sores (lesions) on your skin.  You develop warmth, redness, pain, or swelling around your joints, such as the knee. Summary  Gonorrhea is an STDthat can affect both men and women.  This condition is caused by bacteria called Neisseria gonorrhoeae. The infection is spread from person to person, usually through sexual contact, including oral, anal, and vaginal sex.  Symptoms vary between males and females. Generally, they include abnormal discharge and burning during urination. Women may also experience painful sex, itching around the vagina, and bleeding between menstrual periods. Men may also experience swelling of the testicles.  This condition is treated with antibiotic medicines. Do not have sex until at least 7 days after completing antibiotic treatment.  If left untreated, gonorrhea can have serious side effects and complications. This information is not intended to replace advice given to you by your health care provider. Make sure you discuss any questions you have with your health care provider. Document  Released: 12/23/1999 Document Revised: 11/25/2015 Document Reviewed: 11/25/2015 Elsevier Interactive Patient Education  2018 [employer]  

## 2017-01-23 NOTE — Progress Notes (Signed)
   Patient in nurse clinic today for STD treatment of gonorrhea. Patient advised to abstain from sex for 7-10 days after treatment. Azithromycin 1 GM PO x 1 given and Ceftriaxone 250 mg IM x 1 given in [facility] per Dr. Tyson AliasWalden's orders. Reviewed by Dr Lum BabeEniola (Preceptor) as patient has hives with amoxicillin and there is potential for cross reactivity. Per chart review patient received ceftriaxone in 2015 without incident. Patient also waited in office for 15 minutes following injection without incident. Patient to follow up in one week for re-screening. Appt given for 02/01/2017. STD report form fax completed and [facility] at [phone] (STD department).   HCG, serum qualitative drawn today   DUCATTE, Nickola MajorLAURENZE L, RN

## 2017-01-24 ENCOUNTER — Telehealth: Payer: Self-pay | Admitting: Family Medicine

## 2017-01-24 LAB — HCG, SERUM, QUALITATIVE: HCG, BETA SUBUNIT, QUAL, SERUM: NEGATIVE m[IU]/mL (ref <6)

## 2017-01-24 NOTE — Telephone Encounter (Signed)
Pt called about pregnancy tests results.

## 2017-01-25 NOTE — Telephone Encounter (Signed)
Called and spoke with Lisa [name], Kersti's mom.  Relayed negative serum HCG.  She was elated.  Grateful for call.  Otherwise doing well.

## 2017-01-28 ENCOUNTER — Ambulatory Visit (INDEPENDENT_AMBULATORY_CARE_PROVIDER_SITE_OTHER): Payer: Medicaid Other | Admitting: Family Medicine

## 2017-01-28 ENCOUNTER — Other Ambulatory Visit: Payer: Self-pay

## 2017-01-28 ENCOUNTER — Encounter: Payer: Self-pay | Admitting: Family Medicine

## 2017-01-28 VITALS — BP 108/62 | HR 88 | Temp 98.3°F | Ht 62.0 in | Wt 240.0 lb

## 2017-01-28 DIAGNOSIS — F0781 Postconcussional syndrome: Secondary | ICD-10-CM | POA: Diagnosis present

## 2017-01-28 NOTE — Patient Instructions (Signed)
It was very good to see you
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
## 2017-02-01 ENCOUNTER — Ambulatory Visit: Payer: Self-pay | Admitting: Family Medicine

## 2017-02-23 ENCOUNTER — Other Ambulatory Visit: Payer: Self-pay | Admitting: Family Medicine

## 2017-03-19 ENCOUNTER — Emergency Department (HOSPITAL_COMMUNITY)
Admission: EM | Admit: 2017-03-19 | Discharge: 2017-03-20 | Disposition: A | Discharge status: Home / Self Care | Payer: Medicaid Other | Attending: Emergency Medicine | Admitting: Emergency Medicine

## 2017-03-19 ENCOUNTER — Encounter (HOSPITAL_COMMUNITY): Payer: Self-pay

## 2017-03-19 ENCOUNTER — Encounter: Payer: Self-pay | Admitting: Family Medicine

## 2017-03-19 ENCOUNTER — Ambulatory Visit (INDEPENDENT_AMBULATORY_CARE_PROVIDER_SITE_OTHER): Payer: Medicaid Other | Admitting: Family Medicine

## 2017-03-19 VITALS — BP 114/74 | HR 89 | Temp 98.1°F | Ht 62.0 in | Wt 241.6 lb

## 2017-03-19 DIAGNOSIS — R45851 Suicidal ideations: Secondary | ICD-10-CM

## 2017-03-19 DIAGNOSIS — J45909 Unspecified asthma, uncomplicated: Secondary | ICD-10-CM | POA: Diagnosis not present

## 2017-03-19 DIAGNOSIS — R0789 Other chest pain: Secondary | ICD-10-CM | POA: Insufficient documentation

## 2017-03-19 DIAGNOSIS — F331 Major depressive disorder, recurrent, moderate: Secondary | ICD-10-CM | POA: Diagnosis not present

## 2017-03-19 DIAGNOSIS — F419 Anxiety disorder, unspecified: Secondary | ICD-10-CM | POA: Diagnosis not present

## 2017-03-19 DIAGNOSIS — M79605 Pain in left leg: Secondary | ICD-10-CM | POA: Diagnosis not present

## 2017-03-19 DIAGNOSIS — M7989 Other specified soft tissue disorders: Secondary | ICD-10-CM

## 2017-03-19 DIAGNOSIS — Z79899 Other long term (current) drug therapy: Secondary | ICD-10-CM | POA: Insufficient documentation

## 2017-03-19 LAB — COMPREHENSIVE METABOLIC PANEL
ALK PHOS: 80 U/L (ref 38–126)
ALT: 14 U/L (ref 14–54)
ANION GAP: 9 (ref 5–15)
AST: 13 U/L — ABNORMAL LOW (ref 15–41)
Albumin: 3.2 g/dL — ABNORMAL LOW (ref 3.5–5.0)
BILIRUBIN TOTAL: 0.6 mg/dL (ref 0.3–1.2)
BUN: 7 mg/dL (ref 6–20)
CALCIUM: 9.1 mg/dL (ref 8.9–10.3)
CO2: 21 mmol/L — ABNORMAL LOW (ref 22–32)
Chloride: 108 mmol/L (ref 101–111)
Creatinine, Ser: 0.83 mg/dL (ref 0.44–1.00)
Glucose, Bld: 91 mg/dL (ref 65–99)
POTASSIUM: 3.8 mmol/L (ref 3.5–5.1)
Sodium: 138 mmol/L (ref 135–145)
TOTAL PROTEIN: 6.7 g/dL (ref 6.5–8.1)

## 2017-03-19 LAB — RAPID URINE DRUG SCREEN, HOSP PERFORMED
Amphetamines: NOT DETECTED
BARBITURATES: NOT DETECTED
BENZODIAZEPINES: NOT DETECTED
COCAINE: NOT DETECTED
Opiates: NOT DETECTED
Tetrahydrocannabinol: NOT DETECTED

## 2017-03-19 LAB — SALICYLATE LEVEL: Salicylate Lvl: <7 mg/dL (ref 2.8–30.0)

## 2017-03-19 LAB — CBC
HEMATOCRIT: 37.6 % (ref 36.0–46.0)
Hemoglobin: 12.4 g/dL (ref 12.0–15.0)
MCH: 26.2 pg (ref 26.0–34.0)
MCHC: 33 g/dL (ref 30.0–36.0)
MCV: 79.3 fL (ref 78.0–100.0)
PLATELETS: 252 10*3/uL (ref 150–400)
RBC: 4.74 MIL/uL (ref 3.87–5.11)
RDW: 13.2 % (ref 11.5–15.5)
WBC: 8.1 10*3/uL (ref 4.0–10.5)

## 2017-03-19 LAB — ACETAMINOPHEN LEVEL

## 2017-03-19 LAB — D-DIMER, QUANTITATIVE (NOT AT ARMC): D DIMER QUANT: 0.29 ug{FEU}/mL (ref 0.00–0.50)

## 2017-03-19 LAB — ETHANOL

## 2017-03-19 MED ORDER — IBUPROFEN 200 MG PO TABS: 600.0000 mg | ORAL_TABLET | Freq: Three times a day (TID) | ORAL | Status: DC | PRN

## 2017-03-19 MED ORDER — ONDANSETRON HCL 4 MG PO TABS: 4.0000 mg | ORAL_TABLET | Freq: Three times a day (TID) | ORAL | Status: DC | PRN

## 2017-03-19 NOTE — ED Notes (Signed)
Pt eating dinner at this time

## 2017-03-19 NOTE — ED Notes (Signed)
[facility] criteria and is awaiting placement at this time. Pt guardian made aware and communicated with patient.

## 2017-03-19 NOTE — ED Notes (Signed)
Spoke with Hedwig Mortonatiyana PA for home medications, per provider pt has not been on medication for 3 weeks and they will not be ordered at this time.

## 2017-03-19 NOTE — ED Notes (Signed)
Pt has been wanded 
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
## 2017-03-19 NOTE — ED Provider Notes (Signed)
Patient placed in Quick Look pathway, seen and evaluated   Chief Complaint: suicidal thoughts and knee pain.   HPI:   Pt with suicidal thoughts since yesterday, wanted to overdose on aspirin. States she is sad bc her mom is not spending enough time with her. Per mother, her sister threw away all of her medications 3 wks ago, has not had any medications since. Could not get apt until today to get more meds, today was seen and sent here.   PT also complaining of left knee and left thigh pain that has been going on "for some time." reports some CP and sob. No injuries. Sent here for ro dvt/pe. No risk factors  ROS:  Positive for si, depression, left knee and thigh pain. Positive for cp and sob. Negative for dizziness, lightheadiness, abdominal pain.   Physical Exam:   Gen: No distress  Neuro: Awake and Alert  Skin: Warm    Focused Exam: nad. ttp over patella tendon and quadricept muscle. Full ROM of the knee joint, ankle joint. dp pulse intact. No obvious swelling.   Pt screened by me, here with leg pain, chest pain, sent to ro dvt. No risk factors, exam with low suspicion for dvt, will screen with d dimer. Pt with SI last night, states no long wants to harm herself but does report feeling sad. Will get medical clearance labs.      Initiation of care has begun. The patient has been counseled on the process, plan, and necessity for staying for the completion/evaluation, and the remainder of the medical screening examination    Jaynie CrumbleKirichenko, Lesley Galentine, PA-C 03/19/17 1344    Jacalyn LefevreHaviland, Julie, MD 03/19/17 1459
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
## 2017-03-19 NOTE — Patient Instructions (Signed)
Go straight to the Emergency Department.  I have spoken with the charge nurse.  Give them this sheet of paper.  --------------------------------------------------------------------------------  I am sending this patient over for psychiatric consultation and medical clearance.   Lisa [name] is actively suicidal with a plan.  I am also concerned for a DVT/PE based on leg swelling and chest pain with difficulty breathing.  She therefore also needs medical clearance.    Thank you,  Renold DonJeff Ramar Nobrega MD

## 2017-03-19 NOTE — ED Notes (Signed)
Pt belongings sent home with mom

## 2017-03-20 LAB — I-STAT BETA HCG BLOOD, ED (MC, WL, AP ONLY)

## 2017-03-20 MED ORDER — SERTRALINE HCL 100 MG PO TABS: ORAL_TABLET | ORAL | 0 refills | Status: DC

## 2017-03-20 MED ORDER — QUETIAPINE FUMARATE 25 MG PO TABS: 50.0000 mg | ORAL_TABLET | Freq: Two times a day (BID) | ORAL | 0 refills | Status: DC

## 2017-03-20 MED ORDER — ARIPIPRAZOLE 20 MG PO TABS: 20.0000 mg | ORAL_TABLET | Freq: Every day | ORAL | 0 refills | Status: DC

## 2017-03-20 NOTE — ED Notes (Signed)
Pt using 2nd phone call

## 2017-03-20 NOTE — ED Notes (Signed)
Pt using 1st phone call

## 2017-03-20 NOTE — BH Assessment (Signed)
Pt denies SI/HI and AVH.  Pt states she would like to follow-up with her therapist Dr. Ladona Ridgelaylor  Pt states she receives medication from her primary care physician.   Collateral Contact- Per Pt's mother she feels the Pt has improved and would like for the Pt to return home.  Shuvon, NP recommends D/C and follow-up with current providers.

## 2017-03-20 NOTE — Discharge Instructions (Signed)
Substance Abuse Treatment Programs ° °Intensive Outpatient Programs °[facility]     °[address]      °[place], [place]                   °[phone]      ° °[facility] °[address] °[place], [place] °[phone] ° °[facility]     °(Inpatient and outpatient)     °[address]           °[phone]   ° °[facility] °[phone] (Suboxone and Methadone) ° °[address]      °[place], [place] [address]      °[phone]      ° °[address] °[place], [place] °[phone] ° °[facility] (Outpatient/Inpatient, [facility])    °(insurance only) [phone]      °       °[facility] (Groups & Residential) °[place], [place] °[phone] ° °   °[facility]     °[address]     °[place], [place]      °[phone]      ° °[facility] (for caregivers and family) °[address] °[place], [place] °336-299-4655 ° ° ° ° ° °Residential Treatment Programs °[facility]      °[address], [place], [place] [address]  °([phone]      ° °[facility] °[address] [place], [place] [address] °[phone] ° °[facility]        °[phone]      ° °[facility] °[phone] ° °[facility]             °[address]                                         °[place], [place]                                                °877-615-2722 or 336-784-9470                              ° °[facility] [place] °[address] °[place], [address] °1.877.941.8954 ° °[facility]    °[address]      °[place], [place]     °[phone]      ° °[facility] °[address] °, [place] °[phone] ° °[facility]   °[address]     °[place], [place] [address]     °[phone]      °Admissions: 8am-3pm M-F ° °[facility] °[address] °[place],  [place] °[phone] ° °[facility]: Residential Program (90 Days)   °[place], [place]      °[phone] or [phone]    ° °[facility] °[place],  °(Walk in Hours over the weekend or by referral) ° °[facility] °[address], [place],  [address] °([phone] ° °Crisis Mobile: [facility]:  1-877-626-1772 (for crisis response 24 hours a day) °[facility] Hotline:      1-800-256-2452 °Outpatient Psychiatry and Counseling ° °[facility]: Mobile Crisis   Management 24 hours:  1-877-626-1772 ° °[facility] sliding scale fee and walk in schedule: M-F 8am-12pm/1pm-3pm °[address]  °[place], [place] [address] °[phone] ° °[facility] °[address] °[place], [place] [address] °[phone] ° °[facility] (Former[facility] known [facility])- new patient walk-in appointments available Monday - Friday 8am -3pm.          °[address] °[place], [place] [address] °336-676-6840 or crisis line- 336-676-6905 ° °[facility] Intensive Outpatient Therapy Program °[address] °[place], [place] [address] °[phone] ° °[facility]                  °Crisis Services      °[phone]      °[address]     °[place], [place] [address]                ° °[facility]   °[facility] °[phone] °[address] °[place], [place] [address] ° ° °[facility]          °[address],  °[place], [place] [address]       °([phone] ° °[facility] °[address] °[place], [place] [address] °336-292-1510 ° °[facility]    °[address]    °[place], [place] [address]     °[phone]      ° °Parish McKinney, MD     °[address]     °[place] [address]     °[phone]     °  °[facility] °[address] °[place] [address] ° °[facility]     °[address]     °[place], [place]      °[phone]      ° °[facility] °Shamsher Ahluwalia, MD °[address] °[place], [place] [address] °[phone] ° °[facility]     °[address]     °[place], [place] [address]     °336-274-1237      ° °[facility] °[address] °[place], [place] [address] °336-854-4450 °Resources for Temporary Residential Assistance/Crisis Centers ° °DAY CENTERS °[facility] °M-F 8am-3pm   °[address] [place], [place] [address]   336-332-0824 °Services include: laundry, barbering, support groups, case management, phone  & computer access, showers, AA/NA mtgs, mental health/substance abuse nurse, job skills class, disability information, VA assistance, spiritual classes, etc.  ° °HOMELESS SHELTERS ° °[facility]     °[facility]   °[address], [place]     °[phone]       °       °[facility] (women and children)       °[address] °[place], [place] [address] °336-275-0820 °[email or web address] for application and process °Application Required ° °[facility]   °[address]    °[place] [address]     °[phone]       °             °[facility] °[address] °[place], [place] [address] °[phone] °336-235-0363(schedule application appt.) °Application Required ° °[facility] (women only)    °[address]     °[place], [place] [address]     °336-884-1039      °  Intake starts 6pm daily °Need valid ID, SSC, & Police report °[facility] [place] °[address] °[place], [place] °[phone] °Application Required ° °[facility] (men only)     °[address]      °[place], [place]     °[phone]      ° °[facility] °(Pregnant women only) °[address] °, [place] °336-275-0206 ° °[facility]      °[address]      °[place], [place] [address]     °[phone]      °       °[facility] [place] [facility] °[address] °[place], [place] °336-723-1848 °90 day commitment/SA/Application process ° °[facility](men only)     °[address]     °[place], [place]     °[phone]       °Check-in at 7pm     °       °[facility] °[address] °[place], [place] [address] °336-248-6684 °Men/Women/Women and Children must be there by 7 pm ° °[facility] °[place], [place] °[phone]                ° °

## 2017-03-20 NOTE — ED Provider Notes (Signed)
Blood pressure 131/70, pulse 90, temperature 98.1 F (36.7 C), temperature source Oral, resp. rate 17, SpO2 100 %.  In short, Lisa [name] is a 22 y.o. female with a chief complaint of Suicidal and Leg Pain .  Refer to the original H&P for additional details.  03:00 PM Patient has been cleared by psychiatry for discharge.  I have reviewed the note and will provide a 7-day prescription for her psychiatry medications.  She will follow with her outpatient psychiatrist for additional refills.  Family is on their way to pick up the patient and are comfortable with the plan at discharge.   Alona BeneJoshua Alva Kuenzel, MD    Maia PlanLong, Kolbi Tofte G, MD 03/20/17 [id number]

## 2017-03-20 NOTE — ED Notes (Signed)
Pt waiting for mother to get off work and pick pt up

## 2017-04-02 ENCOUNTER — Encounter: Payer: Self-pay | Admitting: Family Medicine

## 2017-04-02 ENCOUNTER — Other Ambulatory Visit: Payer: Self-pay

## 2017-04-02 ENCOUNTER — Ambulatory Visit (INDEPENDENT_AMBULATORY_CARE_PROVIDER_SITE_OTHER): Payer: Medicaid Other | Admitting: Family Medicine

## 2017-04-02 VITALS — BP 126/64 | HR 109 | Temp 98.1°F | Ht 62.0 in | Wt 243.2 lb

## 2017-04-02 DIAGNOSIS — N921 Excessive and frequent menstruation with irregular cycle: Secondary | ICD-10-CM | POA: Diagnosis present

## 2017-04-02 DIAGNOSIS — N63 Unspecified lump in unspecified breast: Secondary | ICD-10-CM

## 2017-04-02 DIAGNOSIS — Z3009 Encounter for other general counseling and advice on contraception: Secondary | ICD-10-CM

## 2017-04-02 DIAGNOSIS — N6320 Unspecified lump in the left breast, unspecified quadrant: Secondary | ICD-10-CM | POA: Diagnosis not present

## 2017-04-02 DIAGNOSIS — Z3042 Encounter for surveillance of injectable contraceptive: Secondary | ICD-10-CM | POA: Diagnosis not present

## 2017-04-02 DIAGNOSIS — N6322 Unspecified lump in the left breast, upper inner quadrant: Secondary | ICD-10-CM

## 2017-04-02 DIAGNOSIS — N644 Mastodynia: Secondary | ICD-10-CM | POA: Diagnosis not present

## 2017-04-02 LAB — POCT URINE PREGNANCY: Preg Test, Ur: NEGATIVE

## 2017-04-02 MED ORDER — ARIPIPRAZOLE 20 MG PO TABS: 20.0000 mg | ORAL_TABLET | Freq: Every day | ORAL | 1 refills | Status: DC

## 2017-04-02 MED ORDER — QUETIAPINE FUMARATE 25 MG PO TABS: 50.0000 mg | ORAL_TABLET | Freq: Two times a day (BID) | ORAL | 1 refills | Status: DC

## 2017-04-02 MED ORDER — MEDROXYPROGESTERONE ACETATE 150 MG/ML IM SUSY
150.0000 mg | PREFILLED_SYRINGE | Freq: Once | INTRAMUSCULAR | Status: AC
Start: 2017-04-02 — End: 2017-04-02
  Administered 2017-04-02: 150 mg via INTRAMUSCULAR

## 2017-04-02 MED ORDER — SERTRALINE HCL 100 MG PO TABS: ORAL_TABLET | ORAL | 1 refills | Status: DC

## 2017-04-02 NOTE — Patient Instructions (Signed)
It was good to see you again today!  I'm so glad you're doing so well.  [facility] call [facility] to set up the ultrasound.  I have sent refills for your medications today.  Talk to the psychiatrist about moving to the shot instead of the pills.  Come back and see me in about 6 weeks to see how you're doing on Depo.  If the bleeding gets worse, don't wait and come back sooner.
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
## 2017-04-03 ENCOUNTER — Encounter: Payer: Self-pay | Admitting: Family Medicine

## 2017-04-03 LAB — CBC
HEMATOCRIT: 37.7 % (ref 34.0–46.6)
Hemoglobin: 12.3 g/dL (ref 11.1–15.9)
MCH: 25.7 pg — ABNORMAL LOW (ref 26.6–33.0)
MCHC: 32.6 g/dL (ref 31.5–35.7)
MCV: 79 fL (ref 79–97)
Platelets: 245 10*3/uL (ref 150–379)
RBC: 4.78 x10E6/uL (ref 3.77–5.28)
RDW: 14.2 % (ref 12.3–15.4)
WBC: 6 10*3/uL (ref 3.4–10.8)

## 2017-04-03 LAB — BASIC METABOLIC PANEL
BUN/Creatinine Ratio: 10 (ref 9–23)
BUN: 8 mg/dL (ref 6–20)
CALCIUM: 9.5 mg/dL (ref 8.7–10.2)
CO2: 24 mmol/L (ref 20–29)
Chloride: 106 mmol/L (ref 96–106)
Creatinine, Ser: 0.77 mg/dL (ref 0.57–1.00)
GFR calc non Af Amer: 111 mL/min/{1.73_m2} (ref >59)
GFR, EST AFRICAN AMERICAN: 128 mL/min/{1.73_m2} (ref >59)
Glucose: 90 mg/dL (ref 65–99)
Potassium: 4.2 mmol/L (ref 3.5–5.2)
Sodium: 142 mmol/L (ref 134–144)

## 2017-04-05 ENCOUNTER — Ambulatory Visit
Admission: RE | Admit: 2017-04-05 | Discharge: 2017-04-05 | Disposition: A | Discharge status: Home / Self Care | Payer: Medicaid Other | Source: Ambulatory Visit | Attending: Family Medicine | Admitting: Family Medicine

## 2017-04-05 DIAGNOSIS — N63 Unspecified lump in unspecified breast: Secondary | ICD-10-CM

## 2017-04-05 DIAGNOSIS — N644 Mastodynia: Secondary | ICD-10-CM

## 2017-04-16 ENCOUNTER — Other Ambulatory Visit: Payer: Self-pay

## 2017-04-16 ENCOUNTER — Emergency Department (HOSPITAL_COMMUNITY)
Admission: EM | Admit: 2017-04-16 | Discharge: 2017-04-16 | Disposition: A | Discharge status: Home / Self Care | Payer: Medicaid Other | Attending: Emergency Medicine | Admitting: Emergency Medicine

## 2017-04-16 ENCOUNTER — Encounter (HOSPITAL_COMMUNITY): Payer: Self-pay

## 2017-04-16 DIAGNOSIS — R51 Headache: Secondary | ICD-10-CM | POA: Insufficient documentation

## 2017-04-16 DIAGNOSIS — Z5321 Procedure and treatment not carried out due to patient leaving prior to being seen by health care provider: Secondary | ICD-10-CM | POA: Insufficient documentation

## 2017-04-16 DIAGNOSIS — F419 Anxiety disorder, unspecified: Secondary | ICD-10-CM | POA: Diagnosis present

## 2017-04-16 NOTE — ED Notes (Signed)
Pt stated mother was here for her, so wanted to leave. Pt stated "took an aspirin, and feels better". Pt also requested to see family member in ED.

## 2017-04-16 NOTE — ED Triage Notes (Signed)
Patient complains of anxiety related to her dad being in ED. Complains of headache with same, no distress

## 2017-06-13 ENCOUNTER — Ambulatory Visit: Payer: Medicaid Other | Admitting: Family Medicine

## 2017-07-17 ENCOUNTER — Ambulatory Visit: Payer: Medicaid Other | Admitting: Family Medicine

## 2017-08-14 ENCOUNTER — Other Ambulatory Visit: Payer: Self-pay

## 2017-08-14 ENCOUNTER — Ambulatory Visit (INDEPENDENT_AMBULATORY_CARE_PROVIDER_SITE_OTHER): Payer: Medicaid Other | Admitting: Family Medicine

## 2017-08-14 ENCOUNTER — Encounter: Payer: Self-pay | Admitting: Family Medicine

## 2017-08-14 VITALS — BP 110/78 | HR 84 | Temp 98.5°F | Ht 62.0 in | Wt 240.2 lb

## 2017-08-14 DIAGNOSIS — R519 Headache, unspecified: Secondary | ICD-10-CM

## 2017-08-14 DIAGNOSIS — R51 Headache: Secondary | ICD-10-CM

## 2017-08-14 DIAGNOSIS — Z3009 Encounter for other general counseling and advice on contraception: Secondary | ICD-10-CM

## 2017-08-14 DIAGNOSIS — R569 Unspecified convulsions: Secondary | ICD-10-CM

## 2017-08-14 LAB — POCT URINE PREGNANCY: Preg Test, Ur: NEGATIVE

## 2017-08-14 MED ORDER — MEDROXYPROGESTERONE ACETATE 150 MG/ML IM SUSY
150.0000 mg | PREFILLED_SYRINGE | Freq: Once | INTRAMUSCULAR | Status: AC
  Administered 2017-08-14: 150 mg via INTRAMUSCULAR

## 2017-08-14 MED ORDER — QUETIAPINE FUMARATE 25 MG PO TABS: 50.0000 mg | ORAL_TABLET | Freq: Two times a day (BID) | ORAL | 1 refills | Status: DC

## 2017-08-14 MED ORDER — SERTRALINE HCL 100 MG PO TABS: ORAL_TABLET | ORAL | 1 refills | Status: DC

## 2017-08-14 MED ORDER — ARIPIPRAZOLE 20 MG PO TABS: 20.0000 mg | ORAL_TABLET | Freq: Every day | ORAL | 1 refills | Status: DC

## 2017-08-14 NOTE — Patient Instructions (Signed)
It was good to see you today.  We are also getting an MRI of your head.
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
## 2017-08-14 NOTE — Assessment & Plan Note (Signed)
With 3 days of right sided pain. No clear cut injuries To continue to use ibuprofen.  FU if no improvement with next week.

## 2017-08-22 ENCOUNTER — Ambulatory Visit (HOSPITAL_COMMUNITY): Payer: Medicaid Other

## 2017-08-22 ENCOUNTER — Ambulatory Visit (HOSPITAL_COMMUNITY): Admission: RE | Admit: 2017-08-22 | Payer: Medicaid Other | Source: Ambulatory Visit

## 2017-09-02 ENCOUNTER — Other Ambulatory Visit: Payer: Self-pay

## 2017-09-02 ENCOUNTER — Emergency Department (HOSPITAL_COMMUNITY)
Admission: EM | Admit: 2017-09-02 | Discharge: 2017-09-02 | Disposition: A | Discharge status: Home / Self Care | Payer: Medicaid Other | Attending: Emergency Medicine | Admitting: Emergency Medicine

## 2017-09-02 ENCOUNTER — Encounter (HOSPITAL_COMMUNITY): Payer: Self-pay | Admitting: Emergency Medicine

## 2017-09-02 DIAGNOSIS — M7918 Myalgia, other site: Secondary | ICD-10-CM | POA: Insufficient documentation

## 2017-09-02 DIAGNOSIS — Z5321 Procedure and treatment not carried out due to patient leaving prior to being seen by health care provider: Secondary | ICD-10-CM | POA: Insufficient documentation

## 2017-09-02 NOTE — ED Notes (Signed)
Pt's caregiver arrived to the ED and stated they were not going to wait any longer. Pt informed that she is to go to fast track and that 3 people are up for discharge and that it will be any minute now. Pt stated her mother wanted me to move her to the back now. I informed pt that this is an Emergency department and she will be attended as soon as possible. Pt stated she did not want to wait and wanted the IV removed. 18 G IV removed from right AC. Clean Dry Intact.
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
## 2017-09-03 ENCOUNTER — Ambulatory Visit (HOSPITAL_COMMUNITY)
Admission: EM | Admit: 2017-09-03 | Discharge: 2017-09-03 | Disposition: A | Discharge status: Home / Self Care | Payer: Medicaid Other | Attending: Family Medicine | Admitting: Family Medicine

## 2017-09-03 ENCOUNTER — Encounter (HOSPITAL_COMMUNITY): Payer: Self-pay | Admitting: Emergency Medicine

## 2017-09-03 DIAGNOSIS — M545 Low back pain, unspecified: Secondary | ICD-10-CM

## 2017-09-03 DIAGNOSIS — M546 Pain in thoracic spine: Secondary | ICD-10-CM

## 2017-09-03 DIAGNOSIS — R0789 Other chest pain: Secondary | ICD-10-CM

## 2017-09-03 MED ORDER — MELOXICAM 7.5 MG PO TABS: 7.5000 mg | ORAL_TABLET | Freq: Every day | ORAL | 0 refills | Status: DC
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
## 2017-09-10 ENCOUNTER — Ambulatory Visit: Payer: Self-pay | Admitting: Family Medicine

## 2017-09-11 ENCOUNTER — Encounter: Payer: Self-pay | Admitting: Family Medicine

## 2017-09-11 ENCOUNTER — Ambulatory Visit (INDEPENDENT_AMBULATORY_CARE_PROVIDER_SITE_OTHER): Payer: Medicaid Other | Admitting: Family Medicine

## 2017-09-11 ENCOUNTER — Other Ambulatory Visit: Payer: Self-pay

## 2017-09-11 VITALS — BP 96/60 | HR 93 | Temp 98.4°F | Ht 62.0 in | Wt 237.8 lb

## 2017-09-11 DIAGNOSIS — M62838 Other muscle spasm: Secondary | ICD-10-CM

## 2017-09-11 DIAGNOSIS — R0789 Other chest pain: Secondary | ICD-10-CM

## 2017-09-11 MED ORDER — BACLOFEN 10 MG PO TABS: 10.0000 mg | ORAL_TABLET | Freq: Three times a day (TID) | ORAL | 0 refills | Status: DC

## 2017-09-11 MED ORDER — IBUPROFEN 800 MG PO TABS: 800.0000 mg | ORAL_TABLET | Freq: Three times a day (TID) | ORAL | 1 refills | Status: DC | PRN
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
## 2017-09-11 NOTE — Patient Instructions (Signed)
It was very good to see you again today  I am sending you to sports medicine so they can check your wrist with an ultrasound.    Take the Ibuprofen 800 mg every 8 hours as needed for pain.  You can use the muscle relaxer at night to help with sleep and back/neck spasms.  Go by the xray for your chest.  I'll call you with the results.

## 2017-09-13 ENCOUNTER — Encounter: Payer: Self-pay | Admitting: Family Medicine

## 2017-10-10 ENCOUNTER — Other Ambulatory Visit: Payer: Self-pay | Admitting: Family Medicine

## 2017-11-05 ENCOUNTER — Ambulatory Visit: Payer: Self-pay

## 2017-11-06 ENCOUNTER — Ambulatory Visit: Payer: Medicaid Other

## 2017-11-18 ENCOUNTER — Other Ambulatory Visit: Payer: Self-pay | Admitting: Family Medicine

## 2017-12-04 ENCOUNTER — Ambulatory Visit: Payer: Self-pay | Admitting: Family Medicine

## 2017-12-18 ENCOUNTER — Ambulatory Visit (INDEPENDENT_AMBULATORY_CARE_PROVIDER_SITE_OTHER): Payer: Medicaid Other | Admitting: Family Medicine

## 2017-12-18 ENCOUNTER — Encounter: Payer: Self-pay | Admitting: Family Medicine

## 2017-12-18 ENCOUNTER — Other Ambulatory Visit: Payer: Self-pay

## 2017-12-18 VITALS — BP 114/70 | HR 99 | Temp 98.0°F | Ht 62.0 in | Wt 250.2 lb

## 2017-12-18 DIAGNOSIS — M542 Cervicalgia: Secondary | ICD-10-CM | POA: Diagnosis not present

## 2017-12-18 DIAGNOSIS — Z3009 Encounter for other general counseling and advice on contraception: Secondary | ICD-10-CM

## 2017-12-18 DIAGNOSIS — R062 Wheezing: Secondary | ICD-10-CM | POA: Diagnosis not present

## 2017-12-18 MED ORDER — MEDROXYPROGESTERONE ACETATE 150 MG/ML IM SUSP
150.0000 mg | Freq: Once | INTRAMUSCULAR | Status: AC
  Administered 2017-12-18: 150 mg via INTRAMUSCULAR

## 2017-12-18 MED ORDER — FLUTICASONE PROPIONATE 50 MCG/ACT NA SUSP: NASAL | 2 refills | Status: DC

## 2017-12-18 MED ORDER — BACLOFEN 10 MG PO TABS: 10.0000 mg | ORAL_TABLET | Freq: Three times a day (TID) | ORAL | 0 refills | Status: DC

## 2017-12-18 MED ORDER — KETOROLAC TROMETHAMINE 30 MG/ML IJ SOLN
30.0000 mg | Freq: Once | INTRAMUSCULAR | Status: AC
  Administered 2017-12-18: 30 mg via INTRAMUSCULAR

## 2017-12-18 MED ORDER — ALBUTEROL SULFATE (2.5 MG/3ML) 0.083% IN NEBU: 2.5000 mg | INHALATION_SOLUTION | Freq: Four times a day (QID) | RESPIRATORY_TRACT | 6 refills | Status: DC | PRN

## 2017-12-18 MED ORDER — IBUPROFEN 600 MG PO TABS: 600.0000 mg | ORAL_TABLET | Freq: Three times a day (TID) | ORAL | 0 refills | Status: DC | PRN

## 2017-12-18 MED ORDER — EPINEPHRINE 0.3 MG/0.3ML IJ SOAJ: 0.3000 mg | Freq: Once | INTRAMUSCULAR | 2 refills | Status: DC
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
## 2017-12-18 NOTE — Patient Instructions (Addendum)
It was good to see you again today.   I have refilled your asthma medicine, your epi-pen, neb machine, and baclofen muscle relaxer.    Come back to see me in about 6 weeks so we can see how you're doing.    The medical weight management team will give you a call.  I think this will be very helpful.

## 2017-12-19 ENCOUNTER — Telehealth: Payer: Self-pay

## 2017-12-19 NOTE — Telephone Encounter (Signed)
Please resend Epi-Pen as epinephrine auto-injector for Medicaid formulary.  Ples SpecterAlisa Brake, RN [facility] RN)

## 2017-12-20 ENCOUNTER — Encounter: Payer: Self-pay | Admitting: Family Medicine

## 2017-12-20 MED ORDER — EPINEPHRINE 0.3 MG/0.3ML IJ SOAJ: 0.3000 mg | Freq: Once | INTRAMUSCULAR | 1 refills | Status: DC | PRN

## 2017-12-20 NOTE — Telephone Encounter (Signed)
Done

## 2018-01-21 ENCOUNTER — Ambulatory Visit: Payer: Medicaid Other | Admitting: Family Medicine

## 2018-01-22 ENCOUNTER — Ambulatory Visit: Payer: Medicaid Other | Admitting: Family Medicine

## 2018-01-29 ENCOUNTER — Ambulatory Visit: Payer: Medicaid Other | Admitting: Family Medicine

## 2018-01-29 ENCOUNTER — Other Ambulatory Visit: Payer: Self-pay | Admitting: Family Medicine

## 2018-01-29 MED ORDER — ARIPIPRAZOLE 20 MG PO TABS: 20.0000 mg | ORAL_TABLET | Freq: Every day | ORAL | 3 refills | Status: DC

## 2018-02-04 ENCOUNTER — Encounter: Payer: Self-pay | Admitting: Family Medicine

## 2018-02-04 ENCOUNTER — Ambulatory Visit (INDEPENDENT_AMBULATORY_CARE_PROVIDER_SITE_OTHER): Payer: Medicaid Other | Admitting: Family Medicine

## 2018-02-04 ENCOUNTER — Telehealth: Payer: Self-pay

## 2018-02-04 ENCOUNTER — Other Ambulatory Visit: Payer: Self-pay

## 2018-02-04 DIAGNOSIS — F930 Separation anxiety disorder of childhood: Secondary | ICD-10-CM | POA: Diagnosis not present

## 2018-02-04 DIAGNOSIS — G40909 Epilepsy, unspecified, not intractable, without status epilepticus: Secondary | ICD-10-CM

## 2018-02-04 DIAGNOSIS — J45909 Unspecified asthma, uncomplicated: Secondary | ICD-10-CM | POA: Diagnosis not present

## 2018-02-04 DIAGNOSIS — F331 Major depressive disorder, recurrent, moderate: Secondary | ICD-10-CM

## 2018-02-04 DIAGNOSIS — F28 Other psychotic disorder not due to a substance or known physiological condition: Secondary | ICD-10-CM

## 2018-02-04 MED ORDER — QUETIAPINE FUMARATE 25 MG PO TABS: 50.0000 mg | ORAL_TABLET | Freq: Two times a day (BID) | ORAL | 1 refills | Status: DC

## 2018-02-04 MED ORDER — SERTRALINE HCL 100 MG PO TABS: ORAL_TABLET | ORAL | 1 refills | Status: DC

## 2018-02-04 MED ORDER — ALBUTEROL SULFATE HFA 108 (90 BASE) MCG/ACT IN AERS: 2.0000 | INHALATION_SPRAY | Freq: Four times a day (QID) | RESPIRATORY_TRACT | 1 refills | Status: DC | PRN

## 2018-02-04 MED ORDER — IBUPROFEN 600 MG PO TABS: 600.0000 mg | ORAL_TABLET | Freq: Three times a day (TID) | ORAL | 0 refills | Status: DC | PRN

## 2018-02-04 MED ORDER — ARIPIPRAZOLE 20 MG PO TABS: 20.0000 mg | ORAL_TABLET | Freq: Every day | ORAL | 3 refills | Status: DC

## 2018-02-04 MED FILL — PROAIR HFA 90 MCG INHALER: 108 (90 BAS | 25 days supply | Qty: 9 | Fill #0

## 2018-02-04 MED FILL — IBUPROFEN 600 MG TABLET: 600 | 10 days supply | Qty: 30 | Fill #0

## 2018-02-04 MED FILL — QUETIAPINE FUMARATE 25 MG T: 25 | 15 days supply | Qty: 60 | Fill #0

## 2018-02-04 MED FILL — SERTRALINE HCL 100 MG TAB: 100 | 30 days supply | Qty: 30 | Fill #0

## 2018-02-04 NOTE — Patient Instructions (Signed)
It was good to see you today.   Come back and see me next week.  I have referred you back to [facility].

## 2018-02-04 NOTE — Telephone Encounter (Signed)
Cash change of $8 placed back in envelope along with voucher and receipts. Note routed to [place] and Gavin Pound to [facility].  Ples Specter, RN [facility] RN)

## 2018-02-04 NOTE — Telephone Encounter (Signed)
Received call from Lupita Leash at [facility] pharmacy. PCP sent down several prescriptions marked "bill [facility] indigent fund". Unable to bill due to patient having Medicaid. Total of $12 needed for copays on Sertraline, Seroquel, Ibuprofen, and Proair. $20 taken from cash, will bring back change.  [employer] Indigent Fund Voucher:  Previous assistance?  None seen  Taxi, Bus pass, gas card, food, or other? Med copay Med co-pay? $12.00         [facility] Indigent Fund?  Unable to use Other:  Taken from cash and hand delivered to pharmacy.  Provider:  [facility] Staff:  Nigel Mormon, RN Note routed to Altamese Dilling, RN and Sammuel Hines, LCSW to add to [employer].
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
## 2018-02-05 ENCOUNTER — Encounter: Payer: Self-pay | Admitting: Family Medicine

## 2018-02-05 NOTE — Assessment & Plan Note (Signed)
Still very concerned whenever mom not present.  See below for more plan.  Refer back to psych.

## 2018-02-05 NOTE — Assessment & Plan Note (Signed)
Refill for albuterol today.  Indigent fund.   FU next week to assess for improvement.  Can refer for sleep study once psych issues have calmed.

## 2018-02-05 NOTE — Assessment & Plan Note (Signed)
Refilling for her prior psychiatric medications which include Abilify, Seroquel, Zoloft.  As above she has done well on this. -Follow-up next week to assess for improvement. - She would also benefit therapy and she herself would like this.  She asked for biweekly therapy if possible.
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
## 2018-02-05 NOTE — Assessment & Plan Note (Signed)
None currently.  Pseudoseizures still an issue but can be broken with sternal rub or alcohol under nose.

## 2018-02-11 ENCOUNTER — Ambulatory Visit (INDEPENDENT_AMBULATORY_CARE_PROVIDER_SITE_OTHER): Payer: Medicaid Other | Admitting: Family Medicine

## 2018-02-11 ENCOUNTER — Other Ambulatory Visit: Payer: Self-pay

## 2018-02-11 ENCOUNTER — Encounter: Payer: Self-pay | Admitting: Family Medicine

## 2018-02-11 VITALS — BP 110/82 | HR 89 | Temp 97.8°F | Ht 62.0 in | Wt 247.0 lb

## 2018-02-11 DIAGNOSIS — R0683 Snoring: Secondary | ICD-10-CM | POA: Diagnosis not present

## 2018-02-11 DIAGNOSIS — G4730 Sleep apnea, unspecified: Secondary | ICD-10-CM

## 2018-02-11 DIAGNOSIS — Z23 Encounter for immunization: Secondary | ICD-10-CM

## 2018-02-11 DIAGNOSIS — F28 Other psychotic disorder not due to a substance or known physiological condition: Secondary | ICD-10-CM | POA: Diagnosis not present

## 2018-02-11 NOTE — Progress Notes (Signed)
error 
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
## 2018-02-11 NOTE — Patient Instructions (Addendum)
It was good to see you today.  I'm glad you're doing so much better!  Flu shot today.  I will get you set up with a sleep study.  It will be a neurologist who will set this up.  They will call you with an appointment.    Come back and see me in about a month or so to make sure you're doing ok and to do your pap smear.

## 2018-02-13 ENCOUNTER — Encounter: Payer: Self-pay | Admitting: Family Medicine

## 2018-02-13 DIAGNOSIS — R0683 Snoring: Secondary | ICD-10-CM | POA: Insufficient documentation

## 2018-02-13 NOTE — Assessment & Plan Note (Addendum)
Much better controlled now that she is back on her home medication regimen.  Already has referral in place [facility] later this month.

## 2018-02-13 NOTE — Assessment & Plan Note (Signed)
Concern continues for OSA.  She previously had negative sleep study and was doing better.  However mom states that over the past year difficulty sleeping at night have worsened with her continual weight gain.  Worsening apneic episodes.   -I am referring her to neurology for further evaluation of her sleep needs and sleep study.

## 2018-02-20 ENCOUNTER — Encounter (INDEPENDENT_AMBULATORY_CARE_PROVIDER_SITE_OTHER): Payer: Self-pay

## 2018-02-20 ENCOUNTER — Ambulatory Visit (HOSPITAL_COMMUNITY): Payer: Self-pay | Admitting: Psychiatry

## 2018-02-24 ENCOUNTER — Encounter (INDEPENDENT_AMBULATORY_CARE_PROVIDER_SITE_OTHER): Payer: Self-pay

## 2018-02-24 ENCOUNTER — Ambulatory Visit (INDEPENDENT_AMBULATORY_CARE_PROVIDER_SITE_OTHER): Payer: Medicaid Other | Admitting: Family Medicine

## 2018-02-27 ENCOUNTER — Ambulatory Visit (HOSPITAL_COMMUNITY): Payer: Medicaid Other | Admitting: Psychiatry

## 2018-03-04 ENCOUNTER — Encounter: Payer: Self-pay | Admitting: Family Medicine

## 2018-03-04 ENCOUNTER — Ambulatory Visit (INDEPENDENT_AMBULATORY_CARE_PROVIDER_SITE_OTHER): Payer: Medicaid Other | Admitting: Family Medicine

## 2018-03-04 ENCOUNTER — Other Ambulatory Visit (HOSPITAL_COMMUNITY)
Admission: RE | Admit: 2018-03-04 | Discharge: 2018-03-04 | Disposition: A | Discharge status: Home / Self Care | Payer: Medicaid Other | Source: Ambulatory Visit | Attending: Family Medicine | Admitting: Family Medicine

## 2018-03-04 ENCOUNTER — Other Ambulatory Visit: Payer: Self-pay

## 2018-03-04 VITALS — BP 110/72 | HR 95 | Temp 98.1°F | Ht 62.0 in | Wt 250.4 lb

## 2018-03-04 DIAGNOSIS — Z124 Encounter for screening for malignant neoplasm of cervix: Secondary | ICD-10-CM | POA: Insufficient documentation

## 2018-03-04 DIAGNOSIS — Z113 Encounter for screening for infections with a predominantly sexual mode of transmission: Secondary | ICD-10-CM

## 2018-03-04 DIAGNOSIS — G44219 Episodic tension-type headache, not intractable: Secondary | ICD-10-CM

## 2018-03-04 LAB — POCT WET PREP (WET MOUNT)
CLUE CELLS WET PREP WHIFF POC: NEGATIVE
Trichomonas Wet Prep HPF POC: ABSENT
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
## 2018-03-06 ENCOUNTER — Encounter: Payer: Self-pay | Admitting: Family Medicine

## 2018-03-06 LAB — CYTOLOGY - PAP

## 2018-03-06 LAB — CERVICOVAGINAL ANCILLARY ONLY
Chlamydia: POSITIVE — AB
Neisseria Gonorrhea: POSITIVE — AB

## 2018-03-07 ENCOUNTER — Telehealth: Payer: Self-pay | Admitting: Family Medicine
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
## 2018-03-10 ENCOUNTER — Ambulatory Visit (INDEPENDENT_AMBULATORY_CARE_PROVIDER_SITE_OTHER): Payer: Self-pay | Admitting: Family Medicine

## 2018-03-11 ENCOUNTER — Ambulatory Visit (INDEPENDENT_AMBULATORY_CARE_PROVIDER_SITE_OTHER): Payer: Medicaid Other

## 2018-03-11 DIAGNOSIS — Z7251 High risk heterosexual behavior: Secondary | ICD-10-CM

## 2018-03-11 DIAGNOSIS — A749 Chlamydial infection, unspecified: Secondary | ICD-10-CM | POA: Diagnosis not present

## 2018-03-11 DIAGNOSIS — A549 Gonococcal infection, unspecified: Secondary | ICD-10-CM | POA: Diagnosis not present

## 2018-03-11 DIAGNOSIS — Z30019 Encounter for initial prescription of contraceptives, unspecified: Secondary | ICD-10-CM

## 2018-03-11 LAB — POCT URINE PREGNANCY: Preg Test, Ur: NEGATIVE

## 2018-03-11 MED ORDER — AZITHROMYCIN 500 MG PO TABS
500.0000 mg | ORAL_TABLET | Freq: Once | ORAL | Status: AC
  Administered 2018-03-11: 1000 mg via ORAL

## 2018-03-11 MED ORDER — MEDROXYPROGESTERONE ACETATE 150 MG/ML IM SUSP
150.0000 mg | Freq: Once | INTRAMUSCULAR | Status: AC
  Administered 2018-03-11: 150 mg via INTRAMUSCULAR

## 2018-03-11 MED ORDER — CEFTRIAXONE SODIUM 250 MG IJ SOLR
250.0000 mg | Freq: Once | INTRAMUSCULAR | Status: AC
  Administered 2018-03-11: 250 mg via INTRAMUSCULAR

## 2018-03-11 NOTE — Progress Notes (Signed)
Pt presents in nurse clinic for STD treatment for gonorrhea and chlamydia, per Dr. Westly Pam orders. Pt was given 250mg  IM Rocephin injection RUOQ, site unremarkable, in addition to 1gram Azithromycin PO. Pt counseled on safe sex practices and instructed to abstain from sex for 2 weeks and to make sure partner has been treated.   Patient asked for a urine pregnancy test since she is behind on her depo shot. Urine pregnancy obtained and negative. I precepted with Dr. Gwendolyn Grant who stated to restart her depo. Depo injection given LUOQ, site unremarkable. Pt due for next depo, May 19-June 2, reminder card given.

## 2018-03-27 ENCOUNTER — Telehealth: Payer: Self-pay | Admitting: Neurology

## 2018-03-27 ENCOUNTER — Institutional Professional Consult (permissible substitution): Payer: Medicaid Other | Admitting: Neurology

## 2018-03-27 NOTE — Telephone Encounter (Signed)
Pt was a no show to apt 

## 2018-03-31 ENCOUNTER — Telehealth: Payer: Self-pay | Admitting: Family Medicine

## 2018-03-31 ENCOUNTER — Ambulatory Visit: Payer: Medicaid Other

## 2018-03-31 NOTE — Telephone Encounter (Signed)
Chief Complaint: back pain with urination   HPI: Back pain Patient calling for concerns of pain pain on left side x3-4 days. Pain is constant but can be increased with urination. Denies urethral pain with urination. Does report subjective fevers. Reports chills. No hematuria. Reports urinary frequency and urgency.   Assessment/Plan: Given concern of possible pyelonephritis with symptoms of UTI with back pain and fever recommended that patient be seen in clinic as this may be more complicated than a simple UTI. Appointment made for 9:10AM as patient is unavailable in the afternoons.   Will route to [facility] provider as well as preceptor, Dr. Gwendolyn Grant.  No charge for this encounter.   Orpah Clinton, PGY-2 [facility] 03/31/2018 9:18 AM

## 2018-04-02 ENCOUNTER — Ambulatory Visit (INDEPENDENT_AMBULATORY_CARE_PROVIDER_SITE_OTHER): Payer: Medicaid Other | Admitting: Student in an Organized Health Care Education/Training Program

## 2018-04-02 ENCOUNTER — Other Ambulatory Visit: Payer: Self-pay

## 2018-04-02 ENCOUNTER — Telehealth: Payer: Self-pay | Admitting: Family Medicine

## 2018-04-02 VITALS — BP 98/70 | HR 92 | Temp 98.1°F | Wt 252.0 lb

## 2018-04-02 DIAGNOSIS — R300 Dysuria: Secondary | ICD-10-CM

## 2018-04-02 DIAGNOSIS — M545 Low back pain, unspecified: Secondary | ICD-10-CM

## 2018-04-02 DIAGNOSIS — N390 Urinary tract infection, site not specified: Secondary | ICD-10-CM | POA: Diagnosis not present

## 2018-04-02 LAB — POCT URINALYSIS DIP (MANUAL ENTRY)
Glucose, UA: NEGATIVE mg/dL
Nitrite, UA: NEGATIVE
Protein Ur, POC: 100 mg/dL — AB
RBC UA: NEGATIVE
Spec Grav, UA: 1.02 (ref 1.010–1.025)
Urobilinogen, UA: 1 E.U./dL
pH, UA: 7 (ref 5.0–8.0)

## 2018-04-02 LAB — POCT UA - MICROSCOPIC ONLY

## 2018-04-02 MED ORDER — CIPROFLOXACIN HCL 500 MG PO TABS: 500.0000 mg | ORAL_TABLET | Freq: Two times a day (BID) | ORAL | 0 refills | Status: DC

## 2018-04-02 MED ORDER — CEFTRIAXONE SODIUM 1 G IJ SOLR
1.0000 g | Freq: Once | INTRAMUSCULAR | Status: AC
  Administered 2018-04-02: 1 g via INTRAMUSCULAR

## 2018-04-02 NOTE — Telephone Encounter (Signed)
Received call from patient reporting lower back and flank pain for the past 1 week.  She states she had a appointment scheduled for Monday but did not come in because she had an emergency come up.  Pain is described as sharp and stabbing, constant in nature and 8/10 in severity.  Worse with movement and walking.  She reports dysuria and urinary frequency.   She endorses chills x 1 week and feeling hot and cold, has not checked a temp.  No cough or shortness of breath.  Has tried ibuprofen which helps but pain returns once it wears off.  No nausea or vomiting.  Has been eating and drinking well. Discussed with patient that her symptoms may be related to a possible urinary tract infection or, less likely, a kidney infection.  Would recommend coming in to be seen by a provider for further evaluation.  Same-day appointment made with Dr. Artist Pais this morning.  Will route to PCP and provider seeing her today.   Freddrick March MD

## 2018-04-02 NOTE — Patient Instructions (Signed)
It was a pleasure seeing you today in our clinic. Here is the treatment plan we have discussed and agreed upon together:  You were diagnosed with a urinary tract infection.  Please complete your antibiotics.  If you develop difficulty staying hydrated, high fevers, or if you feel very sick please give [place] a call right away.  Our clinic's number is [phone]. Please call with questions or concerns about what we discussed today.  Be well, Dr. Mosetta Putt
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
## 2018-04-02 NOTE — Assessment & Plan Note (Signed)
UTI versus Pyelonephritis. The patient is very well-appearing, however has notable CVA tenderness on exam. No nausea or vomiting. She is afebrile.  For outpatient management of pyelo there is greater than 10% resistance to ciprofloxacin in our community.   - 1 g of IM ceftriaxone - 10-day course of ciprofloxacin - continue ibuprofen as needed for flank pain - push oral fluids - strict reasons to call/return to care discussed

## 2018-04-04 LAB — URINE CULTURE

## 2018-04-08 ENCOUNTER — Telehealth: Payer: Self-pay | Admitting: Neurology

## 2018-04-08 ENCOUNTER — Encounter: Payer: Self-pay | Admitting: Neurology

## 2018-04-08 NOTE — Telephone Encounter (Signed)
Called the patient to reschedule her for the sleep consult apt. No answer. LVM informing the patient that at this time our office is offering virtual video visits and if she was interested in that to call us back.

## 2018-04-17 ENCOUNTER — Ambulatory Visit: Payer: Medicaid Other

## 2018-04-24 ENCOUNTER — Telehealth: Payer: Self-pay | Admitting: Neurology

## 2018-04-24 ENCOUNTER — Encounter: Payer: Self-pay | Admitting: Neurology

## 2018-04-24 NOTE — Telephone Encounter (Signed)
Called the patient to inform them that our office has placed new protocols in place for our office visits. Due to Covid 19 our office is reducing our number of office visits in order to minimize the risk to our patients and healthcare providers.Our office is now providing the capability to offer the patients virtual visits at this time. Informed of what that process looks like and informed that the Virtual visit will still be billed through insurance as such. Due to [place],informed the patient since the appointment is taking place over the phone/internet app, we can't guarantee the security of the phone line. With that said if we do move forward I would have to get verbal consent to completed the call over the phone. Patient gave verbal consent to move forward with the video visit. I have reviewed the patient's chart and made sure that everything is up to date. Patient is also made aware that since this is a video visit we are able to complete the visit but a physical exam is not able to be done since the patient is not present in person. Pt's email is [email or web address]. Pt understands that the [employer] webex software must be downloaded and operational on the device pt plans to use for the visit. Pt verbalized understanding of this information and will states to be ready for the visit at least 15 min prior to the visit.

## 2018-04-28 ENCOUNTER — Other Ambulatory Visit: Payer: Self-pay | Admitting: Family Medicine

## 2018-04-30 ENCOUNTER — Telehealth: Payer: Medicaid Other

## 2018-04-30 ENCOUNTER — Ambulatory Visit: Payer: Self-pay | Admitting: Family Medicine

## 2018-04-30 ENCOUNTER — Telehealth (INDEPENDENT_AMBULATORY_CARE_PROVIDER_SITE_OTHER): Payer: Medicaid Other | Admitting: Family Medicine

## 2018-04-30 ENCOUNTER — Other Ambulatory Visit: Payer: Self-pay

## 2018-04-30 DIAGNOSIS — J4530 Mild persistent asthma, uncomplicated: Secondary | ICD-10-CM | POA: Diagnosis not present

## 2018-04-30 MED ORDER — ALBUTEROL SULFATE HFA 108 (90 BASE) MCG/ACT IN AERS: 2.0000 | INHALATION_SPRAY | Freq: Four times a day (QID) | RESPIRATORY_TRACT | 1 refills | Status: DC | PRN

## 2018-05-01 NOTE — Progress Notes (Signed)
[place] [facility] Telemedicine Visit  Patient consented to have virtual visit. Method of visit: Telephone  Encounter participants: Patient: Lisa [name] - located at home Provider: Renold Don - located at office Others (if applicable): mother Marianna Fuss also on the line, she had a telephone appt as well    Chief Complaint: trouble with cough  HPI:  Overall Mana is doing well and called simply because she was to have a "check-up."  Both she and her mom agrees she is doing very well at this time.  My concern is that occasionally she has a cough worse at nighttime.  She has had no fevers or chills.  No other URI symptoms.  She has known/diagnosed asthma and takes albuterol for this.  She has been out of her albuterol inhaler for about a week or so since being out she has had a mild cough that comes several times for falling asleep.  Occasionally mom hears coughing at nighttime.  Loud snoring continues.  She has not yet had repeat sleep study due to  inability to schedule this due to the coronavirus care.  She would like a refill for her inhaler but otherwise has no concerns.  ROS: per HPI  Pertinent PMHx: Snoring at night.  Mild persistent asthma.  Exam: Gen:  Patient awake, alert, fully conversant, oriented x 4 Auditory:  Hearing normal Resp:  Good strong voice.  Speaking in full sentences.  No coughing during exam.  No respiratory distress.  No audible wheezing over the phone  Psych:  Linear and coherent thought process.  No flight of ideas.    Assessment/Plan:  1.  Mild persistent asthma: -We will refill inhaler today. -She was doing well only needing an inhaler once or twice a week. -As of now she has not needed a controller.  Concern is that if necessary.  Time spent on phone with patient: 8 minutes

## 2018-05-07 NOTE — Telephone Encounter (Signed)
Confirmed the appt with mom. I switched the appt to [email or web address] upon the mother's request. I sent the mom a new eamil on how to access [email or web address].

## 2018-05-12 ENCOUNTER — Other Ambulatory Visit: Payer: Self-pay

## 2018-05-12 ENCOUNTER — Encounter: Payer: Self-pay | Admitting: Neurology

## 2018-05-12 ENCOUNTER — Ambulatory Visit: Payer: Medicaid Other | Admitting: Neurology

## 2018-05-12 NOTE — Telephone Encounter (Signed)
Patient was no show to Video visit on [email or web address] and Webex was checked as well in case she was there and she wasn't.

## 2018-05-12 NOTE — Telephone Encounter (Signed)
Front staff has called the patient's mom and I have also called 3 times with no answer. I will try once more closer to actual apt time. Unable to LVM due to full VM.

## 2018-05-12 NOTE — Progress Notes (Signed)
Virtual Visit via Video Note  I tried to connect with [name] on 05/12/18 at  9:30 AM EDT by a video enabled telemedicine application and verified that I am speaking with the correct person using two identifiers.  No show on either video platform-WebEx and Doxy-me.  I waited 25 minutes to connect.  Please don't reschedule for a virtual visit, and reschedule with first available.    Melvyn Novas, MD

## 2018-05-12 NOTE — Patient Instructions (Signed)
NO show

## 2018-05-13 ENCOUNTER — Other Ambulatory Visit: Payer: Self-pay

## 2018-05-13 MED ORDER — SERTRALINE HCL 100 MG PO TABS: ORAL_TABLET | ORAL | 1 refills | Status: DC

## 2018-05-20 ENCOUNTER — Other Ambulatory Visit: Payer: Self-pay

## 2018-05-20 MED ORDER — QUETIAPINE FUMARATE 25 MG PO TABS: 50.0000 mg | ORAL_TABLET | Freq: Two times a day (BID) | ORAL | 3 refills | Status: DC

## 2018-05-20 MED ORDER — SERTRALINE HCL 100 MG PO TABS: ORAL_TABLET | ORAL | 1 refills | Status: DC

## 2018-05-20 NOTE — Telephone Encounter (Signed)
Seroquel is out of refills. (She has been getting 15 days supplies.)  Dr. Manson Passey refilled Zoloft but printed the Rx. I called [facility] and refilled the Zoloft for 1 month. Sunday Spillers, CMA

## 2018-05-20 NOTE — Telephone Encounter (Signed)
Pt mother checking the status of this refill. Pt needs refills on Zoloft and Seroquel. Please advise

## 2018-05-20 NOTE — Telephone Encounter (Signed)
Spoke to mom. [facility] does not have refills for [facility]. Did not receive Rx. Rx in chart appears to have been printed. Called [facility] and requested refill of Zoloft for 1 month. Message sent to Dr. Gwendolyn Grant to refill Rx. Sunday Spillers, CMA

## 2018-05-21 ENCOUNTER — Ambulatory Visit (INDEPENDENT_AMBULATORY_CARE_PROVIDER_SITE_OTHER): Payer: Medicaid Other | Admitting: Family Medicine

## 2018-05-21 ENCOUNTER — Other Ambulatory Visit: Payer: Self-pay

## 2018-05-21 VITALS — BP 120/64 | HR 110

## 2018-05-21 DIAGNOSIS — J4540 Moderate persistent asthma, uncomplicated: Secondary | ICD-10-CM

## 2018-05-21 DIAGNOSIS — R109 Unspecified abdominal pain: Secondary | ICD-10-CM

## 2018-05-21 LAB — POCT URINALYSIS DIP (MANUAL ENTRY)
Bilirubin, UA: NEGATIVE
Blood, UA: NEGATIVE
Glucose, UA: NEGATIVE mg/dL
Ketones, POC UA: NEGATIVE mg/dL
Leukocytes, UA: NEGATIVE
Nitrite, UA: NEGATIVE
Protein Ur, POC: NEGATIVE mg/dL
Spec Grav, UA: 1.02 (ref 1.010–1.025)
Urobilinogen, UA: 0.2 E.U./dL
pH, UA: 6.5 (ref 5.0–8.0)

## 2018-05-21 MED ORDER — SERTRALINE HCL 100 MG PO TABS: ORAL_TABLET | ORAL | 1 refills | Status: DC

## 2018-05-21 MED ORDER — FLUTICASONE PROPIONATE HFA 44 MCG/ACT IN AERO: 2.0000 | INHALATION_SPRAY | Freq: Two times a day (BID) | RESPIRATORY_TRACT | 1 refills | Status: DC

## 2018-05-21 NOTE — Patient Instructions (Addendum)
  Your urine today looks good- no signs of infection. We will check the urine culture just to be safe. If you have any fevers or worsening symptoms please let us know.  Try a heating pad and tylenol for your side pain.  I have sent in a controller inhaler for you to use every day no matter how your breathing is.   Please follow up in 1 month to follow up on breathing with your PCP.  If you have questions or concerns please do not hesitate to call at [phone].  Dolores Patty, DO PGY-3, [facility] 05/21/2018 2:50 PM

## 2018-05-21 NOTE — Progress Notes (Signed)
    Subjective:    Patient ID: Lisa [name], female    DOB: 1995-03-04, 23 y.o.   MRN: [id number]   CC: left side pain  HPI: reports severe left sided back pain for the past 2-3 days that is similar to when she had a bladder infection about 1 month ago. She reports her urine smells bad. No burning but reports urgency/frequency. No blood in urine. No vaginal bleeding or vaginal discharge. No stomach pain. Has some nausea, no vomiting. Endorses diarrhea. She reports the back pain is most bothersome to her and hurts with any twisting movement.   She also has a persistent cough. Is using albuterol multiple times a day and mom reports she will wake her up at night to give her a breathing treatment because she can hear her wheezing in her sleep. Mom reports her asthma is always worse this time of year with allergies.   Smoking status reviewed- non-smoker  Review of Systems- see HPI   Objective:  BP 120/64   Pulse (!) 110   SpO2 99%  Vitals and nursing note reviewed  General: well nourished, in no acute distress HEENT: normocephalic, TM's visualized bilaterally, no scleral icterus or conjunctival pallor, no nasal discharge, moist mucous membranes, good dentition without erythema or discharge noted in posterior oropharynx Neck: supple, non-tender, without lymphadenopathy Cardiac: RRR, clear S1 and S2, no murmurs, rubs, or gallops Respiratory: clear to auscultation bilaterally, no increased work of breathing Abdomen: soft, nontender, nondistended, no masses or organomegaly. Bowel sounds present Extremities: no edema or cyanosis. Warm, well perfused. 2+ radial and PT pulses bilaterally Skin: warm and dry, no rashes noted Neuro: alert and oriented, no focal deficits   Assessment & Plan:    Asthma  Daily nighttime cough and awakenings. rx for flovent sent in, may need to increase dose. Asked them to follow up w/ PCP in 1 month to assess control.   Left flank pain  UA clean. Will send  for culture. Discussed with patient and her mother this may be an MSK issue. Will hold off on antibiotics until culture results. Advised heat, gentle stretching, tylenol as needed for side pain. If any fevers, dysuria develops asked them to let us know. Patient and mom verbalized understanding and agreement with plan.     Return in about 4 weeks (around 06/18/2018) for asthma.   Dolores Patty, DO Family Medicine Resident PGY-3

## 2018-05-21 NOTE — Addendum Note (Signed)
Addended byGwendolyn Grant, Taura Lamarre H on: 05/21/2018 12:09 PM   Modules accepted: Orders

## 2018-05-21 NOTE — Telephone Encounter (Signed)
Printed by accident.  I have sent this in.

## 2018-05-22 DIAGNOSIS — R109 Unspecified abdominal pain: Secondary | ICD-10-CM | POA: Insufficient documentation

## 2018-05-22 NOTE — Assessment & Plan Note (Signed)
  UA clean. Will send for culture. Discussed with patient and her mother this may be an MSK issue. Will hold off on antibiotics until culture results. Advised heat, gentle stretching, tylenol as needed for side pain. If any fevers, dysuria develops asked them to let us know. Patient and mom verbalized understanding and agreement with plan.

## 2018-05-22 NOTE — Assessment & Plan Note (Signed)
  Daily nighttime cough and awakenings. rx for flovent sent in, may need to increase dose. Asked them to follow up w/ PCP in 1 month to assess control.

## 2018-05-23 LAB — URINE CULTURE

## 2018-05-26 NOTE — Progress Notes (Signed)
Negative ucx.

## 2018-06-11 ENCOUNTER — Ambulatory Visit: Payer: Medicaid Other | Admitting: Student in an Organized Health Care Education/Training Program

## 2018-06-12 ENCOUNTER — Other Ambulatory Visit: Payer: Self-pay

## 2018-06-12 ENCOUNTER — Ambulatory Visit (INDEPENDENT_AMBULATORY_CARE_PROVIDER_SITE_OTHER): Payer: Medicaid Other | Admitting: Student in an Organized Health Care Education/Training Program

## 2018-06-12 ENCOUNTER — Encounter: Payer: Self-pay | Admitting: Student in an Organized Health Care Education/Training Program

## 2018-06-12 VITALS — BP 114/72 | HR 102

## 2018-06-12 DIAGNOSIS — J4540 Moderate persistent asthma, uncomplicated: Secondary | ICD-10-CM

## 2018-06-12 DIAGNOSIS — R350 Frequency of micturition: Secondary | ICD-10-CM | POA: Diagnosis present

## 2018-06-12 LAB — POCT URINALYSIS DIP (MANUAL ENTRY)
Bilirubin, UA: NEGATIVE
Blood, UA: NEGATIVE
Glucose, UA: NEGATIVE mg/dL
Ketones, POC UA: NEGATIVE mg/dL
Nitrite, UA: NEGATIVE
Protein Ur, POC: NEGATIVE mg/dL
Spec Grav, UA: 1.02 (ref 1.010–1.025)
Urobilinogen, UA: 0.2 E.U./dL
pH, UA: 6.5 (ref 5.0–8.0)
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
## 2018-06-13 MED ORDER — FLUTICASONE-SALMETEROL 250-50 MCG/DOSE IN AEPB: 1.0000 | INHALATION_SPRAY | Freq: Two times a day (BID) | RESPIRATORY_TRACT | 1 refills | Status: DC

## 2018-06-13 NOTE — Assessment & Plan Note (Addendum)
-   Prescribed controller advair for increased asthma severity. Patient has taken in past without adverse effects - printed script to replace nebulizer - encouraged to follow up with sleep study - follow up in 2-3 weeks - given return precautions and symptoms to look out for to go to ED. Mother appeared to be knowledgeable and reliable for monitoring

## 2018-06-25 ENCOUNTER — Ambulatory Visit: Payer: Medicaid Other

## 2018-06-30 ENCOUNTER — Ambulatory Visit: Payer: Medicaid Other | Admitting: Family Medicine

## 2018-07-23 ENCOUNTER — Ambulatory Visit: Payer: Medicaid Other | Admitting: Family Medicine

## 2018-08-01 ENCOUNTER — Ambulatory Visit: Payer: Medicaid Other

## 2018-09-11 ENCOUNTER — Other Ambulatory Visit: Payer: Self-pay

## 2018-09-11 ENCOUNTER — Ambulatory Visit (INDEPENDENT_AMBULATORY_CARE_PROVIDER_SITE_OTHER): Payer: Medicaid Other | Admitting: Family Medicine

## 2018-09-11 ENCOUNTER — Encounter: Payer: Self-pay | Admitting: Family Medicine

## 2018-09-11 VITALS — BP 128/78 | HR 96 | Ht 62.0 in | Wt 260.0 lb

## 2018-09-11 DIAGNOSIS — M545 Low back pain, unspecified: Secondary | ICD-10-CM

## 2018-09-11 DIAGNOSIS — R0683 Snoring: Secondary | ICD-10-CM

## 2018-09-11 DIAGNOSIS — T7840XD Allergy, unspecified, subsequent encounter: Secondary | ICD-10-CM | POA: Insufficient documentation

## 2018-09-11 DIAGNOSIS — J4540 Moderate persistent asthma, uncomplicated: Secondary | ICD-10-CM | POA: Diagnosis not present

## 2018-09-11 DIAGNOSIS — Z30019 Encounter for initial prescription of contraceptives, unspecified: Secondary | ICD-10-CM

## 2018-09-11 LAB — POCT URINALYSIS DIP (MANUAL ENTRY)
Bilirubin, UA: NEGATIVE
Blood, UA: NEGATIVE
Glucose, UA: NEGATIVE mg/dL
Ketones, POC UA: NEGATIVE mg/dL
Leukocytes, UA: NEGATIVE
Nitrite, UA: NEGATIVE
Spec Grav, UA: 1.01 (ref 1.010–1.025)
Urobilinogen, UA: 0.2 E.U./dL
pH, UA: 6.5 (ref 5.0–8.0)

## 2018-09-11 LAB — POCT URINE PREGNANCY: Preg Test, Ur: NEGATIVE

## 2018-09-11 MED ORDER — MEDROXYPROGESTERONE ACETATE 150 MG/ML IM SUSY
150.0000 mg | PREFILLED_SYRINGE | INTRAMUSCULAR | Status: DC
  Administered 2018-09-11: 12:00:00 150 mg via INTRAMUSCULAR

## 2018-09-11 MED ORDER — FLUTICASONE PROPIONATE HFA 110 MCG/ACT IN AERO: 2.0000 | INHALATION_SPRAY | Freq: Two times a day (BID) | RESPIRATORY_TRACT | 12 refills | Status: DC

## 2018-09-11 MED ORDER — ALBUTEROL SULFATE HFA 108 (90 BASE) MCG/ACT IN AERS: 2.0000 | INHALATION_SPRAY | Freq: Four times a day (QID) | RESPIRATORY_TRACT | 1 refills | Status: DC | PRN

## 2018-09-11 MED ORDER — EPINEPHRINE 0.3 MG/0.3ML IJ SOAJ: 0.3000 mg | Freq: Once | INTRAMUSCULAR | 1 refills | Status: DC | PRN

## 2018-09-11 MED ORDER — PREDNISONE 10 MG PO TABS: 30.0000 mg | ORAL_TABLET | Freq: Every day | ORAL | 0 refills | Status: AC

## 2018-09-11 MED ORDER — FLUTICASONE PROPIONATE 50 MCG/ACT NA SUSP: NASAL | 2 refills | Status: DC

## 2018-09-11 NOTE — Patient Instructions (Signed)
I believe you are having a worsening of your Asthma.  Please take the Prednisone tablets, 3 tablets at a time for 5 days. Restart the Flovent inhaler, 2 puffs inhaled twice a day. Restart you Albuterol inhaler every 6 hours if you need it for feeling tight in the chest  Dr Iya Hamed will make a new referral for your sleep study.

## 2018-09-12 ENCOUNTER — Encounter: Payer: Self-pay | Admitting: Family Medicine

## 2018-09-12 NOTE — Assessment & Plan Note (Signed)
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
## 2018-09-12 NOTE — Assessment & Plan Note (Signed)
Established problem Symptomatic description would be consistent with uncontrolled asthma, but unable to verify this on exam.  Not able to do Peak Flows in office for now.  I wonder if patient got the Advair filled or the nebulizer device.  Rx Prednisone 30 mg daily x 3 days Albuterol MDI prn  Follow up PCP

## 2018-09-12 NOTE — Assessment & Plan Note (Signed)
Patient's mother asked that I resubmit request for sleep study. Dr Mingo Amber reported a prior negative sleep study for pt, but referred pt toe Neurology sleep specialist.  There are statements recorded from pt or parent that the patient has not gotten the consultation bc of pandemic restrictions.   Rx Amb referral [facility] neuro sleep specialist consultation.

## 2018-09-25 ENCOUNTER — Encounter: Payer: Self-pay | Admitting: Neurology

## 2018-09-25 ENCOUNTER — Other Ambulatory Visit: Payer: Self-pay

## 2018-09-25 ENCOUNTER — Ambulatory Visit: Payer: Medicaid Other | Admitting: Neurology

## 2018-09-25 ENCOUNTER — Other Ambulatory Visit: Payer: Self-pay | Admitting: Neurology

## 2018-09-25 VITALS — BP 114/82 | HR 102 | Temp 98.4°F | Ht 62.0 in | Wt 260.0 lb

## 2018-09-25 DIAGNOSIS — S069X0S Unspecified intracranial injury without loss of consciousness, sequela: Secondary | ICD-10-CM

## 2018-09-25 DIAGNOSIS — R0683 Snoring: Secondary | ICD-10-CM

## 2018-09-25 DIAGNOSIS — Z6841 Body Mass Index (BMI) 40.0 and over, adult: Secondary | ICD-10-CM

## 2018-09-25 DIAGNOSIS — R569 Unspecified convulsions: Secondary | ICD-10-CM

## 2018-09-25 DIAGNOSIS — G4719 Other hypersomnia: Secondary | ICD-10-CM | POA: Insufficient documentation

## 2018-09-25 DIAGNOSIS — F79 Unspecified intellectual disabilities: Secondary | ICD-10-CM | POA: Diagnosis not present

## 2018-09-25 DIAGNOSIS — F445 Conversion disorder with seizures or convulsions: Secondary | ICD-10-CM | POA: Diagnosis not present
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
## 2018-09-25 NOTE — Addendum Note (Signed)
Addended by: Larey Seat on: 09/25/2018 12:19 PM   Modules accepted: Orders

## 2018-09-25 NOTE — Addendum Note (Signed)
Addended by: Inis Sizer D on: 09/25/2018 12:02 PM   Modules accepted: Orders

## 2018-09-25 NOTE — Patient Instructions (Signed)

## 2018-09-29 ENCOUNTER — Telehealth: Payer: Self-pay | Admitting: Neurology

## 2018-09-29 ENCOUNTER — Other Ambulatory Visit: Payer: Self-pay | Admitting: Neurology

## 2018-09-29 DIAGNOSIS — G4719 Other hypersomnia: Secondary | ICD-10-CM

## 2018-09-29 DIAGNOSIS — R0683 Snoring: Secondary | ICD-10-CM

## 2018-09-29 NOTE — Telephone Encounter (Signed)
Order placed

## 2018-09-29 NOTE — Telephone Encounter (Signed)
Pt has medicaid and they will not cover a home sleep study. Can you please place an order for a in lab study.

## 2018-10-01 NOTE — Telephone Encounter (Signed)
Yes; unable to LVM due to mailbox set up yet. I will call back as soon as possible

## 2018-10-01 NOTE — Telephone Encounter (Signed)
Patients mother called and stated she missed a call from the nurse and would like her to call back. Please call and advise.

## 2018-10-04 LAB — NARCOLEPSY EVALUATION
DQA1*01:02: POSITIVE
DQB1*06:02: NEGATIVE

## 2018-10-06 ENCOUNTER — Encounter: Payer: Self-pay | Admitting: Neurology

## 2018-10-08 ENCOUNTER — Encounter: Payer: Self-pay | Admitting: Family Medicine

## 2018-10-08 ENCOUNTER — Other Ambulatory Visit: Payer: Self-pay

## 2018-10-08 ENCOUNTER — Ambulatory Visit (INDEPENDENT_AMBULATORY_CARE_PROVIDER_SITE_OTHER): Payer: Medicaid Other | Admitting: Family Medicine

## 2018-10-08 DIAGNOSIS — R569 Unspecified convulsions: Secondary | ICD-10-CM

## 2018-10-08 DIAGNOSIS — Z6841 Body Mass Index (BMI) 40.0 and over, adult: Secondary | ICD-10-CM | POA: Diagnosis not present

## 2018-10-08 DIAGNOSIS — G4719 Other hypersomnia: Secondary | ICD-10-CM

## 2018-10-08 DIAGNOSIS — R51 Headache: Secondary | ICD-10-CM

## 2018-10-08 DIAGNOSIS — R519 Headache, unspecified: Secondary | ICD-10-CM

## 2018-10-08 DIAGNOSIS — L83 Acanthosis nigricans: Secondary | ICD-10-CM | POA: Diagnosis not present

## 2018-10-08 DIAGNOSIS — G8929 Other chronic pain: Secondary | ICD-10-CM | POA: Insufficient documentation

## 2018-10-08 DIAGNOSIS — Z23 Encounter for immunization: Secondary | ICD-10-CM

## 2018-10-08 DIAGNOSIS — M542 Cervicalgia: Secondary | ICD-10-CM | POA: Diagnosis not present

## 2018-10-08 LAB — POCT GLYCOSYLATED HEMOGLOBIN (HGB A1C): Hemoglobin A1C: 5.3 % (ref 4.0–5.6)

## 2018-10-08 NOTE — Progress Notes (Signed)
21 

## 2018-10-08 NOTE — Assessment & Plan Note (Signed)
RO DM.  A1C OK.

## 2018-10-08 NOTE — Assessment & Plan Note (Signed)
Likely muscle contraction HA.
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
## 2018-10-08 NOTE — Assessment & Plan Note (Signed)
More postural.  Likely contributing to HAs.  Doubt bony abnormality.  Recommend postural exercises.

## 2018-10-08 NOTE — Assessment & Plan Note (Addendum)
Obesity is at the root of most of her medical problems.  Discussed weight loss.  Called with labs and spoke with mother.  Mother very concerned about weight.  Desires nutrition referral.   They are also considering Bariatric surgery.

## 2018-10-08 NOTE — Assessment & Plan Note (Signed)
Sleep study previously ordered.  Certainly has the body habitus for sleep apnea.

## 2018-10-08 NOTE — Patient Instructions (Signed)
Work hard on your posture.  Your slouching is causing some of the neck pain and headaches. Please get the sleep study. I am doing a lot of blood work today to check many things.  I will call with those results tomorrow.   Please keep working working hard on your weight.  Weight loss is the single biggest thing you could do to help you long term health.   Go over your medicine list at home to make sure I have it right.

## 2018-10-09 ENCOUNTER — Telehealth: Payer: Self-pay

## 2018-10-09 LAB — CMP14+EGFR
ALT: 34 IU/L — ABNORMAL HIGH (ref 0–32)
AST: 23 IU/L (ref 0–40)
Albumin/Globulin Ratio: 1.2 (ref 1.2–2.2)
Albumin: 4.1 g/dL (ref 3.9–5.0)
Alkaline Phosphatase: 115 IU/L (ref 39–117)
BUN/Creatinine Ratio: 8 — ABNORMAL LOW (ref 9–23)
BUN: 8 mg/dL (ref 6–20)
Bilirubin Total: <0.2 mg/dL (ref 0.0–1.2)
CO2: 14 mmol/L — ABNORMAL LOW (ref 20–29)
Calcium: 9.7 mg/dL (ref 8.7–10.2)
Chloride: 105 mmol/L (ref 96–106)
Creatinine, Ser: 1.02 mg/dL — ABNORMAL HIGH (ref 0.57–1.00)
GFR calc Af Amer: 90 mL/min/{1.73_m2} (ref >59)
GFR calc non Af Amer: 78 mL/min/{1.73_m2} (ref >59)
Globulin, Total: 3.3 g/dL (ref 1.5–4.5)
Glucose: 87 mg/dL (ref 65–99)
Potassium: 4 mmol/L (ref 3.5–5.2)
Sodium: 141 mmol/L (ref 134–144)
Total Protein: 7.4 g/dL (ref 6.0–8.5)

## 2018-10-09 LAB — CBC
Hematocrit: 40.8 % (ref 34.0–46.6)
Hemoglobin: 13.5 g/dL (ref 11.1–15.9)
MCH: 26.5 pg — ABNORMAL LOW (ref 26.6–33.0)
MCHC: 33.1 g/dL (ref 31.5–35.7)
MCV: 80 fL (ref 79–97)
Platelets: 240 10*3/uL (ref 150–450)
RBC: 5.09 x10E6/uL (ref 3.77–5.28)
RDW: 13.5 % (ref 11.7–15.4)
WBC: 7.4 10*3/uL (ref 3.4–10.8)

## 2018-10-09 LAB — TSH: TSH: 0.868 u[IU]/mL (ref 0.450–4.500)

## 2018-10-09 NOTE — Addendum Note (Signed)
Addended by: Zenia Resides on: 10/09/2018 01:57 PM   Modules accepted: Orders

## 2018-10-09 NOTE — Telephone Encounter (Signed)
Patient calls nurse line stating she gave blood yesterday and now she feels her arm is numb down to her fingers. When asked, patient denies fever, however, she does state her arm is "hot to touch" and painful. I advised her to go to urgent care, however she declines as she has no ride. Apt scheduled for tomorrow with PCP at 850am. ED precautions given.

## 2018-10-10 ENCOUNTER — Ambulatory Visit: Payer: Medicaid Other | Admitting: Family Medicine

## 2018-10-21 ENCOUNTER — Ambulatory Visit: Payer: Medicaid Other | Admitting: Neurology

## 2018-10-29 ENCOUNTER — Telehealth (INDEPENDENT_AMBULATORY_CARE_PROVIDER_SITE_OTHER): Payer: Medicaid Other | Admitting: Family Medicine

## 2018-10-29 ENCOUNTER — Other Ambulatory Visit: Payer: Self-pay

## 2018-10-29 DIAGNOSIS — J069 Acute upper respiratory infection, unspecified: Secondary | ICD-10-CM | POA: Diagnosis not present

## 2018-10-29 NOTE — Assessment & Plan Note (Signed)
Will test patient for Covid and if negative, instructed patient to make an appointment to be seen in person so that her symptoms and blood sugar can be evaluated.  Also encouraged patient to take Tylenol and ibuprofen for symptomatic relief of her back pain.  Encouraged her to eat small meals and to drink fluids frequently to continue to stay hydrated.  Patient does not appear to be short of breath in her conversation, so I do not believe she is having an asthma exacerbation or any concerning symptoms of Covid.

## 2018-10-29 NOTE — Progress Notes (Signed)
[place] Telemedicine Visit  Patient consented to have virtual visit. Method of visit: Telephone  Encounter participants: Patient: Lisa [name] - located at home Provider: Kathrene Alu - located at [facility] Others (if applicable): none  Chief Complaint: cough, stomach ache, back ache  HPI:  Lisa [name] reports that since 2 days ago, she has felt pain in her back, abdominal pain, headache, subjective fever, dry cough, and mild shortness of breath.  She says that her cough improved when drinking water, but she also vomited yesterday and continues to feel somewhat nauseated.  She reports some shortness of breath when she is walking, but she wonders if this may be due to her excess weight rather than her asthma.  She has been taking her Flovent daily and also takes albuterol daily, not as needed.  She denies any known sick contacts.  She also reports that she thinks that her sugar may be high because she is wanting to sleep a lot, but she has no way to check her sugar at home.  ROS: per HPI  Pertinent PMHx: Asthma, TBI, intellectual disability, obesity  Exam:  Respiratory: Able to complete full sentences without audible shortness of breath or cough  Assessment/Plan:  Viral URI with cough Will test patient for Covid and if negative, instructed patient to make an appointment to be seen in person so that her symptoms and blood sugar can be evaluated.  Also encouraged patient to take Tylenol and ibuprofen for symptomatic relief of her back pain.  Encouraged her to eat small meals and to drink fluids frequently to continue to stay hydrated.  Patient does not appear to be short of breath in her conversation, so I do not believe she is having an asthma exacerbation or any concerning symptoms of Covid.    Time spent during visit with patient: 9 minutes

## 2018-11-20 ENCOUNTER — Ambulatory Visit: Payer: Medicaid Other | Admitting: Registered"

## 2018-11-25 ENCOUNTER — Telehealth (INDEPENDENT_AMBULATORY_CARE_PROVIDER_SITE_OTHER): Payer: Medicaid Other | Admitting: Family Medicine

## 2018-11-25 DIAGNOSIS — R82998 Other abnormal findings in urine: Secondary | ICD-10-CM | POA: Diagnosis not present

## 2018-11-25 MED ORDER — NITROFURANTOIN MONOHYD MACRO 100 MG PO CAPS: 100.0000 mg | ORAL_CAPSULE | Freq: Two times a day (BID) | ORAL | 0 refills | Status: AC

## 2018-11-25 NOTE — Progress Notes (Signed)
[place] Telemedicine Visit  Patient consented to have virtual visit. Method of visit: Telephone  Encounter participants: Patient: Lisa [name] - located at home Provider: Chrisandra Netters - located at office Others (if applicable): mother, Jolee Ewing  Chief Complaint: changes in urine  HPI:  Visit was supposed to be in-person in the office, but was converted to virtual as patient could not actually come into the office today as a family member has fever and her mother was unable to drive her in. She is new to me, after her prior PCP Dr. Mingo Amber left our practice. She has a complex history including pseudoseizures, asthma, epilepsy, PTSD, and cognitive delay.   Urine changes - for the last 3 weeks urine has been malodorous and darker. She has not been able to hold her bladder for years. No fevers. Has had some chronic midline back pain which is unchanged. Patient reports she drinks a lot of sodas.   Patient also notes that she has ongoing tossing and turning with trouble breathing at night. Was supposed to have a sleep study for this but it had to be rescheduled. They have not heard about a new date for it.   ROS: per HPI  Pertinent PMHx: She has a complex history including pseudoseizures, asthma, epilepsy, PTSD, and cognitive delay.   Exam:  Respiratory: Patient speaking normally in full sentences throughout the encounter, without any respiratory distress evident.   Assessment/Plan:  Dark/malodorous urine - ideally would check UA, but patient not able to come in to give a sample. Discussed options of treating empirically for UTI vs waiting until she is able to come in. Patient and mother prefer to go ahead and initiate treatment. Has history of PCN allergy so sent in rx for macrobid.   I also gave the phone number for the sleep lab so they could contact the sleep lab to reschedule.  Encouraged them to schedule an in-person visit with me at their  convenience, to review her medical history and ongoing medical issues. Patient and mother very appreciative.  Time spent during visit with patient: 15 minutes

## 2018-12-09 ENCOUNTER — Ambulatory Visit: Payer: Medicaid Other

## 2018-12-29 ENCOUNTER — Ambulatory Visit: Payer: Medicaid Other | Admitting: Family Medicine

## 2018-12-30 ENCOUNTER — Ambulatory Visit: Payer: Medicaid Other

## 2018-12-31 ENCOUNTER — Ambulatory Visit: Payer: Medicaid Other | Admitting: Family Medicine

## 2019-01-29 ENCOUNTER — Ambulatory Visit: Payer: Medicaid Other | Admitting: Student in an Organized Health Care Education/Training Program

## 2019-01-30 ENCOUNTER — Ambulatory Visit (INDEPENDENT_AMBULATORY_CARE_PROVIDER_SITE_OTHER): Payer: Medicaid Other | Admitting: Family Medicine

## 2019-01-30 ENCOUNTER — Other Ambulatory Visit: Payer: Self-pay

## 2019-01-30 ENCOUNTER — Other Ambulatory Visit (HOSPITAL_COMMUNITY)
Admission: RE | Admit: 2019-01-30 | Discharge: 2019-01-30 | Disposition: A | Discharge status: Home / Self Care | Payer: Medicaid Other | Source: Ambulatory Visit | Attending: Family Medicine | Admitting: Family Medicine

## 2019-01-30 ENCOUNTER — Telehealth: Payer: Self-pay | Admitting: Family Medicine

## 2019-01-30 VITALS — BP 110/74 | HR 98 | Wt 266.4 lb

## 2019-01-30 DIAGNOSIS — R87612 Low grade squamous intraepithelial lesion on cytologic smear of cervix (LGSIL): Secondary | ICD-10-CM | POA: Diagnosis not present

## 2019-01-30 DIAGNOSIS — Z3042 Encounter for surveillance of injectable contraceptive: Secondary | ICD-10-CM | POA: Insufficient documentation

## 2019-01-30 DIAGNOSIS — N926 Irregular menstruation, unspecified: Secondary | ICD-10-CM

## 2019-01-30 DIAGNOSIS — Z124 Encounter for screening for malignant neoplasm of cervix: Secondary | ICD-10-CM

## 2019-01-30 DIAGNOSIS — Z113 Encounter for screening for infections with a predominantly sexual mode of transmission: Secondary | ICD-10-CM | POA: Diagnosis not present

## 2019-01-30 LAB — POCT WET PREP (WET MOUNT)
Clue Cells Wet Prep Whiff POC: NEGATIVE
Trichomonas Wet Prep HPF POC: ABSENT

## 2019-01-30 LAB — POCT URINE PREGNANCY: Preg Test, Ur: NEGATIVE

## 2019-01-30 MED ORDER — MEDROXYPROGESTERONE ACETATE 150 MG/ML IM SUSP
150.0000 mg | Freq: Once | INTRAMUSCULAR | Status: AC
  Administered 2019-01-30: 17:00:00 150 mg via INTRAMUSCULAR
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
## 2019-01-30 NOTE — Assessment & Plan Note (Addendum)
Urine pregnancy test negative. STD testing performed today including wet prep, GC/Cl.  Unable to obtain HIV, RPR.  Wet prep negative. Will call with remainder of labs.  She may schedule follow up for remainder of labs if desires. Patient agreed to plan.

## 2019-01-30 NOTE — Assessment & Plan Note (Signed)
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 Pap-smear collected today. Will call with results.

## 2019-01-30 NOTE — Assessment & Plan Note (Signed)
Upreg negative. Depo shot given today.
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
## 2019-02-03 ENCOUNTER — Telehealth: Payer: Self-pay | Admitting: Family Medicine

## 2019-02-03 NOTE — Telephone Encounter (Signed)
Pt is calling to check on status of results from Friday 01/30/19.   Please call pt when results are available.  The best call back number is [phone].

## 2019-02-04 DIAGNOSIS — R87612 Low grade squamous intraepithelial lesion on cytologic smear of cervix (LGSIL): Secondary | ICD-10-CM | POA: Insufficient documentation

## 2019-02-04 LAB — CYTOLOGY - PAP
Chlamydia: NEGATIVE
Comment: NEGATIVE
Comment: NORMAL
Neisseria Gonorrhea: NEGATIVE

## 2019-02-06 ENCOUNTER — Ambulatory Visit: Payer: Medicaid Other

## 2019-02-08 ENCOUNTER — Other Ambulatory Visit: Payer: Self-pay

## 2019-02-08 ENCOUNTER — Emergency Department (HOSPITAL_COMMUNITY): Payer: Medicaid Other

## 2019-02-08 ENCOUNTER — Emergency Department (HOSPITAL_COMMUNITY)
Admission: EM | Admit: 2019-02-08 | Discharge: 2019-02-08 | Disposition: A | Discharge status: Home / Self Care | Payer: Medicaid Other | Attending: Emergency Medicine | Admitting: Emergency Medicine

## 2019-02-08 ENCOUNTER — Encounter (HOSPITAL_COMMUNITY): Payer: Self-pay | Admitting: Emergency Medicine

## 2019-02-08 DIAGNOSIS — Y9389 Activity, other specified: Secondary | ICD-10-CM | POA: Diagnosis not present

## 2019-02-08 DIAGNOSIS — J45909 Unspecified asthma, uncomplicated: Secondary | ICD-10-CM | POA: Diagnosis not present

## 2019-02-08 DIAGNOSIS — F909 Attention-deficit hyperactivity disorder, unspecified type: Secondary | ICD-10-CM | POA: Insufficient documentation

## 2019-02-08 DIAGNOSIS — Y998 Other external cause status: Secondary | ICD-10-CM | POA: Insufficient documentation

## 2019-02-08 DIAGNOSIS — M25531 Pain in right wrist: Secondary | ICD-10-CM

## 2019-02-08 DIAGNOSIS — Z79899 Other long term (current) drug therapy: Secondary | ICD-10-CM | POA: Diagnosis not present

## 2019-02-08 DIAGNOSIS — Y929 Unspecified place or not applicable: Secondary | ICD-10-CM | POA: Diagnosis not present

## 2019-02-08 DIAGNOSIS — S60221A Contusion of right hand, initial encounter: Secondary | ICD-10-CM | POA: Diagnosis not present

## 2019-02-08 DIAGNOSIS — S6991XA Unspecified injury of right wrist, hand and finger(s), initial encounter: Secondary | ICD-10-CM | POA: Diagnosis present

## 2019-02-08 DIAGNOSIS — W228XXA Striking against or struck by other objects, initial encounter: Secondary | ICD-10-CM | POA: Diagnosis not present

## 2019-02-08 MED ORDER — IBUPROFEN 400 MG PO TABS
600.0000 mg | ORAL_TABLET | Freq: Once | ORAL | Status: AC
  Administered 2019-02-08: 600 mg via ORAL
  Filled 2019-02-08: qty 1
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
## 2019-02-08 NOTE — ED Triage Notes (Addendum)
Pt presents to Ed from home POV. Pt had crush injury to R hand while moving piece of furniture. Pt R radial pulse +1. No obvious deformity. No meds taken at home for pain.

## 2019-02-08 NOTE — ED Notes (Signed)
Pt went to x-ray.

## 2019-02-08 NOTE — Discharge Instructions (Addendum)
Today you were given a wrist splint.  This is for comfort only and you may remove it as you wish.  Please make sure you are taking it off multiple times a day.  I would recommend that you use ice for 20 minutes and then make sure you are doing gentle stretching and range of motion of your hand, fingers, wrist elbow and arm.  Please try and do this at least twice a day.  Please take Ibuprofen (Advil, motrin) and Tylenol (acetaminophen) to relieve your pain.  You may take up to 600 MG (3 pills) of normal strength ibuprofen every 8 hours as needed.  In between doses of ibuprofen you make take tylenol, up to 1,000 mg (two extra strength pills).  Do not take more than 3,000 mg tylenol in a 24 hour period.  Please check all medication labels as many medications such as pain and cold medications may contain tylenol.  Do not drink alcohol while taking these medications.  Do not take other NSAID'S while taking ibuprofen (such as aleve or naproxen).  Please take ibuprofen with food to decrease stomach upset.

## 2019-02-13 ENCOUNTER — Other Ambulatory Visit: Payer: Medicaid Other

## 2019-02-16 ENCOUNTER — Other Ambulatory Visit (INDEPENDENT_AMBULATORY_CARE_PROVIDER_SITE_OTHER): Payer: Medicaid Other

## 2019-02-16 ENCOUNTER — Other Ambulatory Visit: Payer: Self-pay

## 2019-02-16 ENCOUNTER — Other Ambulatory Visit: Payer: Medicaid Other

## 2019-02-16 DIAGNOSIS — Z3042 Encounter for surveillance of injectable contraceptive: Secondary | ICD-10-CM | POA: Diagnosis present

## 2019-02-16 LAB — POCT URINE PREGNANCY: Preg Test, Ur: NEGATIVE

## 2019-03-06 ENCOUNTER — Ambulatory Visit: Payer: Medicaid Other | Admitting: Family Medicine

## 2019-03-19 ENCOUNTER — Ambulatory Visit: Payer: Medicaid Other | Admitting: Family Medicine

## 2019-03-20 ENCOUNTER — Ambulatory Visit: Payer: Medicaid Other

## 2019-03-23 ENCOUNTER — Ambulatory Visit (INDEPENDENT_AMBULATORY_CARE_PROVIDER_SITE_OTHER): Payer: Medicaid Other | Admitting: Family Medicine

## 2019-03-23 ENCOUNTER — Encounter: Payer: Self-pay | Admitting: Family Medicine

## 2019-03-23 ENCOUNTER — Other Ambulatory Visit: Payer: Self-pay

## 2019-03-23 VITALS — BP 118/78 | HR 97 | Wt 264.2 lb

## 2019-03-23 DIAGNOSIS — Z3200 Encounter for pregnancy test, result unknown: Secondary | ICD-10-CM | POA: Diagnosis not present

## 2019-03-23 LAB — POCT URINE PREGNANCY: Preg Test, Ur: NEGATIVE

## 2019-03-23 NOTE — Assessment & Plan Note (Addendum)
Pregnancy test was negative today.  Discussed with patient that Depo-Provera is a very effective contraceptive, and as long as she gets the shot every 3 months on time, she has a very low chance of becoming pregnant.  Also discussed that condoms are the only way to prevent sexually transmitted infections and encouraged her to use a condom every time she has sex.  Since patient's siblings have children and she has no diagnoses that would increase her risk of infertility such as endometriosis or polycystic ovarian syndrome, I reassured her that she likely can have children in the future.  I advised that when she is ready to become a mother in the future, she can use a sperm donor or she may have a partner with whom she would want to have a family.

## 2019-03-23 NOTE — Progress Notes (Signed)
    SUBJECTIVE:   CHIEF COMPLAINT / HPI:   Question about fertility Patient says that she wondered if she was pregnant because she had some nausea a week or 2 ago.  She took a pregnancy test which was negative, but she was unsure if the reading was correct.  She has been sexually active with female partner and does not always use a condom.  She denies any changes in vaginal discharge or feeling unwell in general.  She would like to know if she is fertile and would like information on sperm donation.  She does not have a specific reason why she might not be fertile and says that all of her siblings have children.  She does not currently want to be pregnant.  She had a negative pregnancy test and received the Depo-Provera injection on January 30, 2019.  PERTINENT  PMH / PSH: seizure disorder, traumatic brain injury, intellectual disability, depression, PTSD  OBJECTIVE:   BP 118/78   Pulse 97   Wt 264 lb 3.2 oz (119.8 kg)   SpO2 100%   BMI 48.32 kg/m   General: well appearing, appears stated age, pleasant, obese Cardiac: RRR, no MRG Respiratory: CTAB, no rhonchi, rales, or wheezing, normal work of breathing Skin: no rashes or other lesions, warm and well perfused Psych: appropriate mood and affect, upbeat   ASSESSMENT/PLAN:   Possible pregnancy Pregnancy test was negative today.  Discussed with patient that Depo-Provera is a very effective contraceptive, and as long as she gets the shot every 3 months on time, she has a very low chance of becoming pregnant.  Also discussed that condoms are the only way to prevent sexually transmitted infections and encouraged her to use a condom every time she has sex.  Since patient's siblings have children and she has no diagnoses that would increase her risk of infertility such as endometriosis or polycystic ovarian syndrome, I reassured her that she likely can have children in the future.  I advised that when she is ready to become a mother in the  future, she can use a sperm donor or she may have a partner with whom she would want to have a family.     Lennox Solders, MD [facility]

## 2019-03-25 ENCOUNTER — Encounter (HOSPITAL_COMMUNITY): Payer: Self-pay

## 2019-03-25 ENCOUNTER — Other Ambulatory Visit: Payer: Self-pay

## 2019-03-25 ENCOUNTER — Emergency Department (HOSPITAL_COMMUNITY)
Admission: EM | Admit: 2019-03-25 | Discharge: 2019-03-25 | Disposition: A | Discharge status: Home / Self Care | Payer: Medicaid Other | Attending: Emergency Medicine | Admitting: Emergency Medicine

## 2019-03-25 ENCOUNTER — Emergency Department (HOSPITAL_COMMUNITY): Payer: Medicaid Other

## 2019-03-25 DIAGNOSIS — Y9389 Activity, other specified: Secondary | ICD-10-CM | POA: Insufficient documentation

## 2019-03-25 DIAGNOSIS — Y9289 Other specified places as the place of occurrence of the external cause: Secondary | ICD-10-CM | POA: Diagnosis not present

## 2019-03-25 DIAGNOSIS — Z5321 Procedure and treatment not carried out due to patient leaving prior to being seen by health care provider: Secondary | ICD-10-CM | POA: Insufficient documentation

## 2019-03-25 DIAGNOSIS — R569 Unspecified convulsions: Secondary | ICD-10-CM | POA: Insufficient documentation

## 2019-03-25 DIAGNOSIS — Y999 Unspecified external cause status: Secondary | ICD-10-CM | POA: Insufficient documentation

## 2019-03-25 DIAGNOSIS — S6992XA Unspecified injury of left wrist, hand and finger(s), initial encounter: Secondary | ICD-10-CM | POA: Insufficient documentation

## 2019-03-25 LAB — BASIC METABOLIC PANEL
Anion gap: 14 (ref 5–15)
BUN: 9 mg/dL (ref 6–20)
CO2: 19 mmol/L — ABNORMAL LOW (ref 22–32)
Calcium: 9.5 mg/dL (ref 8.9–10.3)
Chloride: 107 mmol/L (ref 98–111)
Creatinine, Ser: 0.95 mg/dL (ref 0.44–1.00)
GFR calc Af Amer: >60 mL/min (ref >60)
GFR calc non Af Amer: >60 mL/min (ref >60)
Glucose, Bld: 107 mg/dL — ABNORMAL HIGH (ref 70–99)
Potassium: 3.2 mmol/L — ABNORMAL LOW (ref 3.5–5.1)
Sodium: 140 mmol/L (ref 135–145)

## 2019-03-25 LAB — CBC
HCT: 43.1 % (ref 36.0–46.0)
Hemoglobin: 13.5 g/dL (ref 12.0–15.0)
MCH: 26.1 pg (ref 26.0–34.0)
MCHC: 31.3 g/dL (ref 30.0–36.0)
MCV: 83.2 fL (ref 80.0–100.0)
Platelets: 280 10*3/uL (ref 150–400)
RBC: 5.18 MIL/uL — ABNORMAL HIGH (ref 3.87–5.11)
RDW: 13.6 % (ref 11.5–15.5)
WBC: 8 10*3/uL (ref 4.0–10.5)
nRBC: 0 % (ref 0.0–0.2)

## 2019-03-25 LAB — I-STAT BETA HCG BLOOD, ED (MC, WL, AP ONLY): I-stat hCG, quantitative: <5 m[IU]/mL (ref <5)
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
## 2019-03-25 NOTE — ED Notes (Addendum)
Pt left campus, stated she did not want to stay longer. RN Woodroe Chen notified.

## 2019-04-07 ENCOUNTER — Ambulatory Visit: Payer: Medicaid Other | Admitting: Family Medicine

## 2019-04-14 ENCOUNTER — Ambulatory Visit: Payer: Medicaid Other | Admitting: Family Medicine

## 2019-04-17 ENCOUNTER — Ambulatory Visit: Payer: Medicaid Other

## 2019-04-24 ENCOUNTER — Ambulatory Visit: Payer: Medicaid Other

## 2019-04-27 ENCOUNTER — Ambulatory Visit (INDEPENDENT_AMBULATORY_CARE_PROVIDER_SITE_OTHER): Payer: Medicaid Other | Admitting: Family Medicine

## 2019-04-27 ENCOUNTER — Other Ambulatory Visit: Payer: Self-pay

## 2019-04-27 VITALS — BP 115/80 | HR 101 | Ht 62.0 in | Wt 261.0 lb

## 2019-04-27 DIAGNOSIS — Z113 Encounter for screening for infections with a predominantly sexual mode of transmission: Secondary | ICD-10-CM

## 2019-04-27 DIAGNOSIS — Z3200 Encounter for pregnancy test, result unknown: Secondary | ICD-10-CM | POA: Diagnosis present

## 2019-04-27 DIAGNOSIS — Z3042 Encounter for surveillance of injectable contraceptive: Secondary | ICD-10-CM | POA: Diagnosis not present

## 2019-04-27 DIAGNOSIS — J4540 Moderate persistent asthma, uncomplicated: Secondary | ICD-10-CM

## 2019-04-27 DIAGNOSIS — F331 Major depressive disorder, recurrent, moderate: Secondary | ICD-10-CM | POA: Diagnosis not present

## 2019-04-27 DIAGNOSIS — R7309 Other abnormal glucose: Secondary | ICD-10-CM

## 2019-04-27 DIAGNOSIS — T7840XD Allergy, unspecified, subsequent encounter: Secondary | ICD-10-CM

## 2019-04-27 LAB — POCT URINE PREGNANCY: Preg Test, Ur: NEGATIVE

## 2019-04-27 MED ORDER — QUETIAPINE FUMARATE 25 MG PO TABS: 50.0000 mg | ORAL_TABLET | Freq: Two times a day (BID) | ORAL | 3 refills | Status: DC

## 2019-04-27 MED ORDER — ARIPIPRAZOLE 20 MG PO TABS: 20.0000 mg | ORAL_TABLET | Freq: Every day | ORAL | 3 refills | Status: DC

## 2019-04-27 MED ORDER — MONTELUKAST SODIUM 10 MG PO TABS: 10.0000 mg | ORAL_TABLET | Freq: Every day | ORAL | 0 refills | Status: DC

## 2019-04-27 MED ORDER — EPINEPHRINE 0.3 MG/0.3ML IJ SOAJ: 0.3000 mg | Freq: Once | INTRAMUSCULAR | 1 refills | Status: DC | PRN

## 2019-04-27 MED ORDER — ALBUTEROL SULFATE HFA 108 (90 BASE) MCG/ACT IN AERS: 2.0000 | INHALATION_SPRAY | Freq: Four times a day (QID) | RESPIRATORY_TRACT | 1 refills | Status: DC | PRN

## 2019-04-27 NOTE — Progress Notes (Signed)
    SUBJECTIVE:   CHIEF COMPLAINT / HPI:   Possible pregnancy Unlikely but patient does ask for pregnancy test.  Says she has been sexually active with males and that she normally uses condoms.  Says she is safe and on makes her do things.  Does say that she has been using the Depo shot for birth control.  Says she has been doing this for 7 years.  Med list does seem like this might have been given last in January.  Patient did not want swab and due to miscommunication with lab had already given clean sample of urine, will need to return urine sample for other urine cytology testing  PERTINENT  PMH / PSH: Patient says mother is her guardian  OBJECTIVE:   BP 115/80   Pulse (!) 101   Ht 5\' 2"  (1.575 m)   Wt 261 lb (118.4 kg)   SpO2 95%   BMI 47.74 kg/m   General: Pleasant but with some cognitive deficits, no distress.  Patient did not want swab/vaginal exam today   ASSESSMENT/PLAN:   Possible pregnancy Unlikely but patient does ask for pregnancy test.  Says she has been sexually active with males and that she normally uses condoms.  Says she is safe and on makes her do things.  Does say that she has been using the Depo shot for birth control.  Says she has been doing this for 7 years.  Med list does seem like this might have been given last in January  Did discuss thoroughly with the patient's mother who identified herself as guardian that Depo has an osteoporosis risk if used for more than a few years at a time as well as the need to make sure that this medication was given every 3 months if she is going to avoid pregnancy.  Mother notes that she has a history of breast cancer in the family.  I have asked them to compile a medical history of all relevant ovarian/breast/cervical cancers and schedule appointment with Dr. February to discuss their long-term plan moving forward  Screen for STD (sexually transmitted disease) No dysuria, abnormal discharge complaints, pelvic or abdominal pain  complaints.  Patient has been sexually active and like to be tested just in case  Encounter for surveillance of injectable contraceptive Did discuss thoroughly with the patient's mother who identified herself as guardian that Depo has an osteoporosis risk if used for more than a few years at a time as well as the need to make sure that this medication was given every 3 months if she is going to avoid pregnancy.  Mother notes that she has a history of breast cancer in the family.  I have asked them to compile a medical history of all relevant ovarian/breast/cervical cancers and schedule appointment with Dr. Pollie Meyer to discuss their long-term plan moving forward      Pollie Meyer, DO [facility]

## 2019-04-27 NOTE — Patient Instructions (Addendum)
Today we talked about your birth control, I think you should consider potentially looking at other options beyond Depo due to risk of osteoporosis.  Please talk to your mother and Annette Stable the family tree of breast, cervical, ovarian cancer and come back and discuss this with Dr. Pollie Meyer.  We did a urine pregnancy test which seems to be negative.  Would like to encourage you to continue using condoms for STD protection even though you are using the Depo for birth control.  We ordered an HIV test and a diabetes test at your request and we are going to test your urine when you bring back a sample to [place] for other sexually transmitted diseases.  You did say that you are safe and that no one is making you do anything you do not want to do at home, if at any point this changes please reach out to [place] immediately so that we can help Dr. Billy Fischer

## 2019-04-28 LAB — HIV ANTIBODY (ROUTINE TESTING W REFLEX): HIV Screen 4th Generation wRfx: NONREACTIVE

## 2019-04-28 LAB — HEMOGLOBIN A1C
Est. average glucose Bld gHb Est-mCnc: 103 mg/dL
Hgb A1c MFr Bld: 5.2 % (ref 4.8–5.6)

## 2019-04-30 NOTE — Assessment & Plan Note (Addendum)
Unlikely but patient does ask for pregnancy test.  Says she has been sexually active with males and that she normally uses condoms.  Says she is safe and on makes her do things.  Does say that she has been using the Depo shot for birth control.  Says she has been doing this for 7 years.  Med list does seem like this might have been given last in January  Did discuss thoroughly with the patient's mother who identified herself as guardian that Depo has an osteoporosis risk if used for more than a few years at a time as well as the need to make sure that this medication was given every 3 months if she is going to avoid pregnancy.  Mother notes that she has a history of breast cancer in the family.  I have asked them to compile a medical history of all relevant ovarian/breast/cervical cancers and schedule appointment with Dr. Pollie Meyer to discuss their long-term plan moving forward

## 2019-04-30 NOTE — Assessment & Plan Note (Signed)
No dysuria, abnormal discharge complaints, pelvic or abdominal pain complaints.  Patient has been sexually active and like to be tested just in case

## 2019-04-30 NOTE — Assessment & Plan Note (Signed)
Did discuss thoroughly with the patient's mother who identified herself as guardian that Depo has an osteoporosis risk if used for more than a few years at a time as well as the need to make sure that this medication was given every 3 months if she is going to avoid pregnancy.  Mother notes that she has a history of breast cancer in the family.  I have asked them to compile a medical history of all relevant ovarian/breast/cervical cancers and schedule appointment with Dr. Pollie Meyer to discuss their long-term plan moving forward

## 2019-05-20 ENCOUNTER — Ambulatory Visit: Payer: Medicaid Other | Admitting: Family Medicine

## 2019-05-21 ENCOUNTER — Ambulatory Visit (INDEPENDENT_AMBULATORY_CARE_PROVIDER_SITE_OTHER): Payer: Medicaid Other | Admitting: Family Medicine

## 2019-05-21 ENCOUNTER — Other Ambulatory Visit: Payer: Self-pay

## 2019-05-21 VITALS — BP 122/62 | HR 88 | Ht 62.0 in | Wt 263.0 lb

## 2019-05-21 DIAGNOSIS — Z0289 Encounter for other administrative examinations: Secondary | ICD-10-CM
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
## 2019-05-21 NOTE — Assessment & Plan Note (Signed)
Patient has a history of pseudoseizures and seizure disorder.  Has followed with neurology.  Patient denies seizures since age 24, but mother called into appointment and advised that patient has frequent seizures that are staring spells and that she should be on medication for her seizures but patient has taken herself off.  Given this, this provider is not able to medically clear patient for plasma donation.  Recommended patient follow up with neurology to discuss clearance for this and treatment of seizures.

## 2019-05-21 NOTE — Patient Instructions (Signed)
Thank you for coming to see me today. It was a pleasure. Today we talked about:   I recommend you make an appointment with your neurologist to have them sign off on this.  If you have any questions or concerns, please do not hesitate to call the office at [phone].  Best,   Luis Abed, DO

## 2019-05-22 ENCOUNTER — Observation Stay (HOSPITAL_BASED_OUTPATIENT_CLINIC_OR_DEPARTMENT_OTHER)
Admission: AD | Admit: 2019-05-22 | Discharge: 2019-05-22 | Disposition: A | Discharge status: Home / Self Care | Payer: Medicaid Other | Attending: Psychiatry | Admitting: Psychiatry

## 2019-05-22 ENCOUNTER — Encounter (HOSPITAL_COMMUNITY): Payer: Self-pay | Admitting: Psychiatry

## 2019-05-22 ENCOUNTER — Other Ambulatory Visit: Payer: Self-pay

## 2019-05-22 ENCOUNTER — Emergency Department (HOSPITAL_COMMUNITY)
Admission: EM | Admit: 2019-05-22 | Discharge: 2019-05-22 | Disposition: A | Discharge status: Other Acute Inpatient Hospital | Payer: Medicaid Other | Attending: Emergency Medicine | Admitting: Emergency Medicine

## 2019-05-22 DIAGNOSIS — W268XXA Contact with other sharp object(s), not elsewhere classified, initial encounter: Secondary | ICD-10-CM | POA: Insufficient documentation

## 2019-05-22 DIAGNOSIS — Y9389 Activity, other specified: Secondary | ICD-10-CM | POA: Insufficient documentation

## 2019-05-22 DIAGNOSIS — Y929 Unspecified place or not applicable: Secondary | ICD-10-CM | POA: Insufficient documentation

## 2019-05-22 DIAGNOSIS — Z20822 Contact with and (suspected) exposure to covid-19: Secondary | ICD-10-CM | POA: Insufficient documentation

## 2019-05-22 DIAGNOSIS — S61411A Laceration without foreign body of right hand, initial encounter: Secondary | ICD-10-CM | POA: Diagnosis not present

## 2019-05-22 DIAGNOSIS — F322 Major depressive disorder, single episode, severe without psychotic features: Secondary | ICD-10-CM

## 2019-05-22 DIAGNOSIS — F332 Major depressive disorder, recurrent severe without psychotic features: Secondary | ICD-10-CM | POA: Diagnosis present

## 2019-05-22 DIAGNOSIS — F333 Major depressive disorder, recurrent, severe with psychotic symptoms: Secondary | ICD-10-CM | POA: Diagnosis present

## 2019-05-22 DIAGNOSIS — Y999 Unspecified external cause status: Secondary | ICD-10-CM | POA: Diagnosis not present

## 2019-05-22 DIAGNOSIS — R4586 Emotional lability: Secondary | ICD-10-CM | POA: Diagnosis present

## 2019-05-22 DIAGNOSIS — F323 Major depressive disorder, single episode, severe with psychotic features: Secondary | ICD-10-CM | POA: Diagnosis present

## 2019-05-22 LAB — SARS CORONAVIRUS 2 BY RT PCR (HOSPITAL ORDER, PERFORMED IN ~~LOC~~ HOSPITAL LAB): SARS Coronavirus 2: NEGATIVE

## 2019-05-22 LAB — GLUCOSE, CAPILLARY: Glucose-Capillary: 86 mg/dL (ref 70–99)

## 2019-05-22 MED ORDER — ARIPIPRAZOLE 10 MG PO TABS
20.0000 mg | ORAL_TABLET | Freq: Every day | ORAL | Status: DC
  Administered 2019-05-22: 20 mg via ORAL
  Filled 2019-05-22: qty 2

## 2019-05-22 MED ORDER — SERTRALINE HCL 100 MG PO TABS
100.0000 mg | ORAL_TABLET | Freq: Every day | ORAL | Status: DC
  Administered 2019-05-22: 100 mg via ORAL
  Filled 2019-05-22: qty 1

## 2019-05-22 MED ORDER — QUETIAPINE FUMARATE 25 MG PO TABS: 50.0000 mg | ORAL_TABLET | Freq: Two times a day (BID) | ORAL | 0 refills | Status: DC

## 2019-05-22 MED ORDER — ARIPIPRAZOLE 20 MG PO TABS: 20.0000 mg | ORAL_TABLET | Freq: Every day | ORAL | 0 refills | Status: DC

## 2019-05-22 MED ORDER — BACITRACIN ZINC 500 UNIT/GM EX OINT
TOPICAL_OINTMENT | CUTANEOUS | Status: AC
  Administered 2019-05-22: 1
  Filled 2019-05-22: qty 0.9

## 2019-05-22 MED ORDER — QUETIAPINE FUMARATE 25 MG PO TABS: 25.0000 mg | ORAL_TABLET | Freq: Two times a day (BID) | ORAL | Status: DC

## 2019-05-22 MED ORDER — SERTRALINE HCL 100 MG PO TABS: 100.0000 mg | ORAL_TABLET | Freq: Every day | ORAL | 0 refills | Status: DC

## 2019-05-22 NOTE — [facility] Suicide Risk Assessment (Cosign Needed)
Suicide Risk Assessment  Discharge Assessment   [facility] Discharge Suicide Risk Assessment   Principal Problem: <principal problem not specified> Discharge Diagnoses: Active Problems:   Mood swings   MDD (major depressive disorder), single episode, severe with psychotic features (HCC)   Total Time spent with patient: 20 minutes  Musculoskeletal: Strength & Muscle Tone: within normal limits Gait & Station: normal Patient leans: N/A  Psychiatric Specialty Exam:   Blood pressure (!) 119/98, pulse (!) 104, temperature 98.9 F (37.2 C), temperature source Oral, resp. rate 18, height 5\' 2"  (1.575 m), weight 117.9 kg, SpO2 99 %.Body mass index is 47.55 kg/m.  General Appearance: Casual and Fairly Groomed  Eye Contact::  Good  Speech:  Clear and Coherent409  Volume:  Normal  Mood:  Euthymic  Affect:  Appropriate and Congruent  Thought Process:  Coherent, Goal Directed and Descriptions of Associations: Intact  Orientation:  Full (Time, Place, and Person)  Thought Content:  WDL and Logical  Suicidal Thoughts:  No  Homicidal Thoughts:  No  Memory:  Immediate;   Good Recent;   Good Remote;   Good  Judgement:  Fair  Insight:  Fair  Psychomotor Activity:  Normal  Concentration:  Good  Recall:  Good  Fund of Knowledge:Good  Language: Good  Akathisia:  No  Handed:  Right  AIMS (if indicated):     Assets:  Communication Skills Desire for Improvement Financial Resources/Insurance Housing Intimacy Leisure Time Physical Health Resilience Social Support  Sleep:     Cognition: WNL  ADL's:  Intact   Mental Status Per Nursing Assessment::   On Admission:  Plan to harm others, Intention to act on plan to harm others  Demographic Factors:  NA  Loss Factors: NA  Historical Factors: NA  Risk Reduction Factors:   Living with another person, especially a relative, Positive social support, Positive therapeutic relationship and Positive coping skills or problem solving  skills  Continued Clinical Symptoms:  Medical Diagnoses and Treatments/Surgeries  Cognitive Features That Contribute To Risk:  None    Suicide Risk:  Minimal: No identifiable suicidal ideation.  Patients presenting with no risk factors but with morbid ruminations; may be classified as minimal risk based on the severity of the depressive symptoms    Plan Of Care/Follow-up recommendations:  Other:  Follow up with oupatient psychiatry  [phone], FNP 05/22/2019, 12:14 PM

## 2019-05-22 NOTE — Progress Notes (Signed)
Patient discharged to home with mother as her transport. RN reviewed all discharge instructions via AVS with both patient and her mother. Both parties verbalized understanding of all instructions to include RX's and follow-up care. All belongings returned to patient intact. Safety maintained. Re-iterated to mother (legal guardian) the importance of providing legal documentation of guardianship. Patient consented to mother's acknowledgement of POC.
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
## 2019-05-22 NOTE — ED Notes (Signed)
Report called to [facility] adult unit and [employer] called for transport back to [facility]
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
## 2019-05-22 NOTE — Plan of Care (Signed)
[facility] Observation Crisis Plan  Reason for Crisis Plan:  Crisis Stabilization   Plan of Care:  Referral for Inpatient Hospitalization  Family Support:      Current Living Environment:  Living Arrangements: Parent  Insurance:   Hospital Account    Name Acct ID Class Status Primary Coverage   Lisa [name], Lisa [name] [id number] BEHAVIORAL HEALTH OBSERVATION [place]        Guarantor Account (for Hospital Account [id number])    Name Relation to Pt Service Area Active? Acct Type   Lisa [name] Self [facility] Yes Behavioral Health   Address Phone       [address] [place], [place] [address] [phone](H)          Coverage Information (for Hospital Account [id number])    F/O Payor/Plan Precert #   [facility] [place]    Subscriber Subscriber #   Lisa [name], Lisa [name] [id number]   Address Phone   [address] [place], [place] [address] [phone]      Legal Guardian:  Legal Guardian: Mother  Primary Care Provider:  Latrelle Dodrill, MD  Current Outpatient Providers:  Lisa Dodrill, MD  Psychiatrist:  Name of Psychiatrist: None(Family doctor prescribes)  Counselor/Therapist:  Name of Therapist: None  Compliant with Medications:  No  Additional Information:   Lisa [name] 5/14/20216:34 AM

## 2019-05-22 NOTE — Discharge Instructions (Signed)
For your behavioral health needs, you are advised to continue treatment with your current outpatient provider.  If you wish to change providers, ask your current provider to recommend alternatives.

## 2019-05-22 NOTE — BH Assessment (Signed)
[facility] Assessment Progress Note  Per Berneice Heinrich, this pt does not require psychiatric hospitalization at this time.  Pt presents under IVC initiated by pt's mother which has been rescinded by Nelly Rout, MD.  Pt is to be discharged from the [facility] Observation Unit with recommendation to continue treatment with her current outpatient provider.  This has been included in pt's discharge instructions.  Pt's nurse, Vernona Rieger, has been notified.  Doylene Canning, MA Triage Specialist [phone]
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
## 2019-05-28 ENCOUNTER — Ambulatory Visit: Payer: Medicaid Other

## 2019-06-16 ENCOUNTER — Other Ambulatory Visit: Payer: Self-pay

## 2019-06-16 ENCOUNTER — Ambulatory Visit (INDEPENDENT_AMBULATORY_CARE_PROVIDER_SITE_OTHER): Payer: Medicaid Other | Admitting: Family Medicine

## 2019-06-16 VITALS — BP 105/80 | HR 107 | Ht 62.0 in | Wt 267.6 lb

## 2019-06-16 DIAGNOSIS — Z23 Encounter for immunization: Secondary | ICD-10-CM | POA: Diagnosis not present

## 2019-06-16 DIAGNOSIS — N644 Mastodynia: Secondary | ICD-10-CM

## 2019-06-16 DIAGNOSIS — R32 Unspecified urinary incontinence: Secondary | ICD-10-CM

## 2019-06-16 DIAGNOSIS — Z3202 Encounter for pregnancy test, result negative: Secondary | ICD-10-CM | POA: Diagnosis present

## 2019-06-16 DIAGNOSIS — F323 Major depressive disorder, single episode, severe with psychotic features: Secondary | ICD-10-CM | POA: Diagnosis not present

## 2019-06-16 LAB — POCT URINALYSIS DIP (MANUAL ENTRY)
Bilirubin, UA: NEGATIVE
Glucose, UA: NEGATIVE mg/dL
Ketones, POC UA: NEGATIVE mg/dL
Nitrite, UA: NEGATIVE
Protein Ur, POC: 30 mg/dL — AB
Spec Grav, UA: 1.02 (ref 1.010–1.025)
Urobilinogen, UA: 1 E.U./dL
pH, UA: 7 (ref 5.0–8.0)

## 2019-06-16 LAB — POCT UA - MICROSCOPIC ONLY: Epithelial cells, urine per micros: >20

## 2019-06-16 LAB — POCT URINE PREGNANCY: Preg Test, Ur: NEGATIVE

## 2019-06-16 NOTE — Progress Notes (Signed)
°  Date of Visit: 06/16/2019   SUBJECTIVE:   HPI:  Lisa [name] presents today for multiple issues. This is my first time seeing her in the office as her new PCP.  Back pain - has had this for 1.5 weeks. Located on L lower back. Noticing malodorous urine for over a year. No fevers. No dysuria. Has leaked urine for over 4 years. Previously saw urology at [facility] years ago for enuresis. No new bowel incontinence, lower extremity weakness, or saddle anesthesia.  Mood - currently taking zoloft 100mg  daily, seroquel 50mg  twice daily, and abilify 20mg  daily. Denies SI/HI. Thinks mood is overall doing well on this regimen. Recently hospitalized at behavioral health briefly. Needs new psychiatrist to manage her medications, would like info on possible providers.  Breast issue - brought up at very end of visit. Has noticed some pain in her R breast, as well as leaking from that breast. No bloody discharge.  OBJECTIVE:   BP 105/80    Pulse (!) 107    Ht 5\' 2"  (1.575 m)    Wt 267 lb 9.6 oz (121.4 kg)    SpO2 99%    BMI 48.94 kg/m  Gen: no acute distress, pleasant, cooperative, well appearing HEENT: normocephalic, atraumatic  Lungs: normal work of breathing  Neuro: alert, speech normal, grossly nonfocal Psych: affect happy, well groomed, speech louder than appropriate for setting. normal eye contact  Breast: examined with Chaperone present. R breast with tenderness in upper breast. No nipple discharge or masses. L breast normal. No axillary lymphadenopathy on either side. Back: mild tenderness to palpation of L lower back musculature. No CVA tenderness. No midline spinal tenderness. Full strength bilateral lower extremities   ASSESSMENT/PLAN:   Health maintenance:  -has received both doses of COVID vaccine -Tdap administered today  Low back pain Exam consistent with muscle strain. No red flags.  Recommend heating pad, back exercises Checked UA given report of urine odor and incontinence, but neither of  these symptoms are new. In absence of dysuria and fever, very low suspicion for UTI. UA appears to be dirty catch with many squamous cells. Will not treat for UTI.   MDD (major depressive disorder), single episode, severe with psychotic features (HCC) Currently stable. Gave info on psychiatrists that take medicaid.  Breast pain/discharge Somewhat limited evaluation today as this was brought up by patient at the end of the visit despite making a shared agenda at the beginning of the visit. Exam remarkable just for tenderness, no masses. Check ultrasound of breast Follow up in a few weeks to discuss further, may need labs.   J. , MD [facility]

## 2019-06-16 NOTE — Patient Instructions (Addendum)
Heating pad For pain, take one pill of ibuprofen (200mg ) and one pill of tylenol (500mg ) Take both pills at the same time. Do this every 6 hours.   Tetanus shot today  Call the Behavioral Medicine center to schedule an appointment. Their phone number is: [phone].   Can also try: [facility] [address], [address]  [place], [address] [place]       [phone]     Checking ultrasound of breast  Follow up with me in a few weeks to talk about birth control and the breast issue in more detail  Be well, Dr. [id number]   Back Exercises These exercises help to make your trunk and back strong. They also help to keep the lower back flexible. Doing these exercises can help to prevent back pain or lessen existing pain.  If you have back pain, try to do these exercises 2-3 times each day or as told by your doctor.  As you get better, do the exercises once each day. Repeat the exercises more often as told by your doctor.  To stop back pain from coming back, do the exercises once each day, or as told by your doctor. Exercises Single knee to chest Do these steps 3-5 times in a row for each leg: 1. Lie on your back on a firm bed or the floor with your legs stretched out. 2. Bring one knee to your chest. 3. Grab your knee or thigh with both hands and hold them it in place. 4. Pull on your knee until you feel a gentle stretch in your lower back or buttocks. 5. Keep doing the stretch for 10-30 seconds. 6. Slowly let go of your leg and straighten it. Pelvic tilt Do these steps 5-10 times in a row: 1. Lie on your back on a firm bed or the floor with your legs stretched out. 2. Bend your knees so they point up to the ceiling. Your feet should be flat on the floor. 3. Tighten your lower belly (abdomen) muscles to press your lower back against the floor. This will make your tailbone point up to the ceiling instead of pointing down to your feet or the floor. 4. Stay in this  position for 5-10 seconds while you gently tighten your muscles and breathe evenly. Cat-cow Do these steps until your lower back bends more easily: 1. Get on your hands and knees on a firm surface. Keep your hands under your shoulders, and keep your knees under your hips. You may put padding under your knees. 2. Let your head hang down toward your chest. Tighten (contract) the muscles in your belly. Point your tailbone toward the floor so your lower back becomes rounded like the back of a cat. 3. Stay in this position for 5 seconds. 4. Slowly lift your head. Let the muscles of your belly relax. Point your tailbone up toward the ceiling so your back forms a sagging arch like the back of a cow. 5. Stay in this position for 5 seconds.  Press-ups Do these steps 5-10 times in a row: 1. Lie on your belly (face-down) on the floor. 2. Place your hands near your head, about shoulder-width apart. 3. While you keep your back relaxed and keep your hips on the floor, slowly straighten your arms to raise the top half of your body and lift your shoulders. Do not use your back muscles. You may change where you place your hands in order to make yourself more comfortable. 4. Stay in  this position for 5 seconds. 5. Slowly return to lying flat on the floor.  Bridges Do these steps 10 times in a row: 1. Lie on your back on a firm surface. 2. Bend your knees so they point up to the ceiling. Your feet should be flat on the floor. Your arms should be flat at your sides, next to your body. 3. Tighten your butt muscles and lift your butt off the floor until your waist is almost as high as your knees. If you do not feel the muscles working in your butt and the back of your thighs, slide your feet 1-2 inches farther away from your butt. 4. Stay in this position for 3-5 seconds. 5. Slowly lower your butt to the floor, and let your butt muscles relax. If this exercise is too easy, try doing it with your arms crossed over  your chest. Belly crunches Do these steps 5-10 times in a row: 1. Lie on your back on a firm bed or the floor with your legs stretched out. 2. Bend your knees so they point up to the ceiling. Your feet should be flat on the floor. 3. Cross your arms over your chest. 4. Tip your chin a little bit toward your chest but do not bend your neck. 5. Tighten your belly muscles and slowly raise your chest just enough to lift your shoulder blades a tiny bit off of the floor. Avoid raising your body higher than that, because it can put too much stress on your low back. 6. Slowly lower your chest and your head to the floor. Back lifts Do these steps 5-10 times in a row: 1. Lie on your belly (face-down) with your arms at your sides, and rest your forehead on the floor. 2. Tighten the muscles in your legs and your butt. 3. Slowly lift your chest off of the floor while you keep your hips on the floor. Keep the back of your head in line with the curve in your back. Look at the floor while you do this. 4. Stay in this position for 3-5 seconds. 5. Slowly lower your chest and your face to the floor. Contact a doctor if:  Your back pain gets a lot worse when you do an exercise.  Your back pain does not get better 2 hours after you exercise. If you have any of these problems, stop doing the exercises. Do not do them again unless your doctor says it is okay. Get help right away if:  You have sudden, very bad back pain. If this happens, stop doing the exercises. Do not do them again unless your doctor says it is okay. This information is not intended to replace advice given to you by your health care provider. Make sure you discuss any questions you have with your health care provider. Document Revised: 09/19/2017 Document Reviewed: 09/19/2017 Elsevier Patient Education  2020 [employer].

## 2019-06-18 ENCOUNTER — Ambulatory Visit: Payer: Medicaid Other | Admitting: Family Medicine

## 2019-06-19 NOTE — Assessment & Plan Note (Signed)
Currently stable. Gave info on psychiatrists that take medicaid.

## 2019-06-26 ENCOUNTER — Other Ambulatory Visit: Payer: Self-pay

## 2019-06-29 ENCOUNTER — Other Ambulatory Visit: Payer: Self-pay

## 2019-06-29 ENCOUNTER — Ambulatory Visit: Payer: Medicaid Other

## 2019-06-29 NOTE — Progress Notes (Signed)
°  Date of Visit: 06/30/2019   SUBJECTIVE:   HPI:  Lisa [name] presents today for follow up of breast pain and leaking.  Breast pain/leaking - has ultrasound scheduled on 7/1 - this was delayed because the family car was not usable for a while, they had to reschedule the appointment. Has had leaking from R breast with occasional bloody discharge. Leaking does not happen every day. Area of pain in the upper R breast as well (see breast exam from last office visit note).  Contraception - previously using depo. Last depo shot in January. LMP in December.  Mood is overall stable. Needs refill on her zoloft. Denies SI/HI. Is working on getting in with psychiatry, mom has made phone calls and is waiting on a call back.  Mom does note that patient has been repeatedly engaging in dangerous behaviors with people she meets online. She nearly was abducted into a human trafficking ring. Mom has been advising patient to stop meeting people online but she has continued to do so.  OBJECTIVE:   BP 115/82    Pulse 85    Ht 5\' 2"  (1.575 m)    Wt 266 lb (120.7 kg)    SpO2 99%    BMI 48.65 kg/m  Gen: no acute distress, pleasant, cooperative HEENT: normocephalic, atraumatic  Psych: normal range of affect, well groomed, speech normal in rate and volume, normal eye contact  Breast: R breast without any obvious discharge or nipple abnormalities.   ASSESSMENT/PLAN:    Breast pain and discharge Ultrasound pending Check labs today - BMET, TSH, prolactin upreg negative   MDD (major depressive disorder), single episode, severe with psychotic features (HCC) Stable. Refill zoloft while she searches for a psychiatrist  Contraception management Hold on restarting depo until workup for breast issue is complete - want to avoid exogenous hormones until issue is clarified.  Counseled patient on the dangers of meeting people online.  Quant gold drawn today - patient needs for potential employment  FOLLOW  UP: Follow up in 6 weeks for above issues  J. [place], MD [facility]

## 2019-06-30 ENCOUNTER — Other Ambulatory Visit: Payer: Self-pay

## 2019-06-30 ENCOUNTER — Ambulatory Visit (INDEPENDENT_AMBULATORY_CARE_PROVIDER_SITE_OTHER): Payer: Medicaid Other | Admitting: Family Medicine

## 2019-06-30 VITALS — BP 115/82 | HR 85 | Ht 62.0 in | Wt 266.0 lb

## 2019-06-30 DIAGNOSIS — Z111 Encounter for screening for respiratory tuberculosis: Secondary | ICD-10-CM

## 2019-06-30 DIAGNOSIS — N6452 Nipple discharge: Secondary | ICD-10-CM

## 2019-06-30 DIAGNOSIS — F323 Major depressive disorder, single episode, severe with psychotic features: Secondary | ICD-10-CM

## 2019-06-30 DIAGNOSIS — Z3009 Encounter for other general counseling and advice on contraception: Secondary | ICD-10-CM

## 2019-06-30 LAB — POCT URINE PREGNANCY: Preg Test, Ur: NEGATIVE

## 2019-06-30 MED ORDER — SERTRALINE HCL 100 MG PO TABS: 100.0000 mg | ORAL_TABLET | Freq: Every day | ORAL | 0 refills | Status: DC

## 2019-06-30 NOTE — Patient Instructions (Signed)
Refilled zoloft  Checking labs today Get ultrasound  Follow up with me in 6 weeks  Be well, Dr. Pollie Meyer

## 2019-07-02 ENCOUNTER — Encounter: Payer: Self-pay | Admitting: Family Medicine

## 2019-07-02 LAB — BASIC METABOLIC PANEL
BUN/Creatinine Ratio: 6 — ABNORMAL LOW (ref 9–23)
BUN: 5 mg/dL — ABNORMAL LOW (ref 6–20)
CO2: 24 mmol/L (ref 20–29)
Calcium: 9.3 mg/dL (ref 8.7–10.2)
Chloride: 104 mmol/L (ref 96–106)
Creatinine, Ser: 0.84 mg/dL (ref 0.57–1.00)
GFR calc Af Amer: 113 mL/min/{1.73_m2} (ref >59)
GFR calc non Af Amer: 98 mL/min/{1.73_m2} (ref >59)
Glucose: 78 mg/dL (ref 65–99)
Potassium: 3.8 mmol/L (ref 3.5–5.2)
Sodium: 139 mmol/L (ref 134–144)

## 2019-07-02 LAB — TSH: TSH: 1.96 u[IU]/mL (ref 0.450–4.500)

## 2019-07-02 LAB — QUANTIFERON-TB GOLD PLUS
QuantiFERON Mitogen Value: >10 IU/mL
QuantiFERON Nil Value: 0.3 IU/mL
QuantiFERON TB1 Ag Value: 0.24 IU/mL
QuantiFERON TB2 Ag Value: 0.26 IU/mL
QuantiFERON-TB Gold Plus: NEGATIVE

## 2019-07-02 LAB — PROLACTIN: Prolactin: 10.4 ng/mL (ref 4.8–23.3)

## 2019-07-05 NOTE — Assessment & Plan Note (Signed)
Hold on restarting depo until workup for breast issue is complete - want to avoid exogenous hormones until issue is clarified.

## 2019-07-05 NOTE — Assessment & Plan Note (Signed)
Stable. Refill zoloft while she searches for a psychiatrist

## 2019-07-09 ENCOUNTER — Other Ambulatory Visit: Payer: Self-pay

## 2019-07-09 ENCOUNTER — Ambulatory Visit
Admission: RE | Admit: 2019-07-09 | Discharge: 2019-07-09 | Disposition: A | Discharge status: Home / Self Care | Payer: Medicaid Other | Source: Ambulatory Visit | Attending: Family Medicine | Admitting: Family Medicine

## 2019-07-09 DIAGNOSIS — N644 Mastodynia: Secondary | ICD-10-CM

## 2019-07-10 ENCOUNTER — Telehealth: Payer: Self-pay | Admitting: Family Medicine

## 2019-07-10 DIAGNOSIS — N6452 Nipple discharge: Secondary | ICD-10-CM

## 2019-07-10 NOTE — Telephone Encounter (Signed)
Called and spoke with patient's mother, Marianna Fuss about breast U/S results. They were aware of results and that she needs MRI as well as surgical consultation.  Orders placed. Red team, please schedule MRI and contact mother with an appointment  Thanks Latrelle Dodrill, MD

## 2019-07-14 NOTE — Telephone Encounter (Signed)
Called [facility] to make appointment for Breast MRI,  spoke to [facility],  Questions were asked that the scheduler needed to talk to the patient.    As the patient is not in the office , I instructed the scheduler to call patient's legal guardian her [name].  Manuella Ghazi states that she will call and speak to [facility] and make appointment.  Glennie Hawk, CMA

## 2019-07-20 NOTE — Telephone Encounter (Signed)
Thanks Shelly. It looks like this still hasn't been scheduled. Can you check with Marianna Fuss on whether she's been contacted? It is important that we get this MRI.  Thanks! Latrelle Dodrill, MD

## 2019-07-20 NOTE — Telephone Encounter (Signed)
Spoke to patient's mother Mick Sell and was told that she has not heard from [facility].  Gave number to [facility] and she will call them to schedule MRI  For patient.  If she has any problems she will call [facility].  Glennie Hawk, CMA

## 2019-07-21 ENCOUNTER — Ambulatory Visit: Payer: Medicaid Other

## 2019-07-30 ENCOUNTER — Ambulatory Visit (INDEPENDENT_AMBULATORY_CARE_PROVIDER_SITE_OTHER): Payer: Medicaid Other | Admitting: Family Medicine

## 2019-07-30 ENCOUNTER — Other Ambulatory Visit: Payer: Self-pay

## 2019-07-30 ENCOUNTER — Other Ambulatory Visit (HOSPITAL_COMMUNITY)
Admission: RE | Admit: 2019-07-30 | Discharge: 2019-07-30 | Disposition: A | Discharge status: Home / Self Care | Payer: Medicaid Other | Source: Ambulatory Visit | Attending: Family Medicine | Admitting: Family Medicine

## 2019-07-30 VITALS — BP 104/66 | HR 89 | Ht 62.0 in | Wt 267.8 lb

## 2019-07-30 DIAGNOSIS — Z124 Encounter for screening for malignant neoplasm of cervix: Secondary | ICD-10-CM | POA: Diagnosis not present

## 2019-07-30 DIAGNOSIS — Z113 Encounter for screening for infections with a predominantly sexual mode of transmission: Secondary | ICD-10-CM

## 2019-07-30 LAB — POCT WET PREP (WET MOUNT)
Clue Cells Wet Prep Whiff POC: POSITIVE
Trichomonas Wet Prep HPF POC: ABSENT

## 2019-07-30 LAB — POCT URINALYSIS DIP (MANUAL ENTRY)
Bilirubin, UA: NEGATIVE
Blood, UA: NEGATIVE
Glucose, UA: NEGATIVE mg/dL
Ketones, POC UA: NEGATIVE mg/dL
Nitrite, UA: NEGATIVE
Protein Ur, POC: NEGATIVE mg/dL
Spec Grav, UA: 1.02 (ref 1.010–1.025)
Urobilinogen, UA: 1 E.U./dL
pH, UA: 6.5 (ref 5.0–8.0)

## 2019-07-30 LAB — POCT URINE PREGNANCY: Preg Test, Ur: NEGATIVE

## 2019-07-30 LAB — POCT UA - MICROSCOPIC ONLY: Epithelial cells, urine per micros: >20

## 2019-07-30 NOTE — Patient Instructions (Signed)
Thank you for coming to see me today. It was a pleasure. Today we talked about:   I have scheduled you for Nexplanon insertion on Thursday July 29 at 220 pm  Please follow-up with PCP as needed  If you have any questions or concerns, please do not hesitate to call the office at [phone].  Best,   Dana Allan, MD Family Medicine Residency

## 2019-07-31 ENCOUNTER — Encounter: Payer: Self-pay | Admitting: Family Medicine

## 2019-07-31 LAB — CERVICOVAGINAL ANCILLARY ONLY
Chlamydia: NEGATIVE
Comment: NEGATIVE
Comment: NORMAL
Neisseria Gonorrhea: NEGATIVE

## 2019-08-02 ENCOUNTER — Encounter: Payer: Self-pay | Admitting: Family Medicine

## 2019-08-02 NOTE — Assessment & Plan Note (Signed)
-  Swab sent for GC&C -wet mount negative for yeast infection -HIV and hepatitis labs today -urine pregnancy test negative -discussed in detail different options for birth control.  Patient reports she wants to have Nexplanon inserted.  Appointment booked for next week in clinic.

## 2019-08-02 NOTE — Progress Notes (Signed)
    SUBJECTIVE:   CHIEF COMPLAINT / HPI: STI testing  Patient reports no acute concerns.  Denies any vaginal discharge or bleeding.  Reports 1 sexual partner in the last year.  Unsure when last menstrual period.  Is not on any contraceptives and is not using any condoms.  History of sexual promiscuity.  Offered birth control at this visit and discussed different options.  Patient wanting Nexplanon.  PERTINENT  PMH / PSH:  PTSD Impaired intellect   OBJECTIVE:   BP 104/66   Pulse 89   Ht 5\' 2"  (1.575 m)   Wt (!) 267 lb 12.8 oz (121.5 kg)   SpO2 98%   BMI 48.98 kg/m    General: Alert and oriented, no acute distress EVE: No lesions or abrasions noted SVE: Vaginal mucosa normal, cervix normal, small amount yellow vaginal discharge noted bimanual: No CMT or adnexal masses appreciated  ASSESSMENT/PLAN:   Screen for STD (sexually transmitted disease) -Swab sent for GC&C -wet mount negative for yeast infection -HIV and hepatitis labs today -urine pregnancy test negative -discussed in detail different options for birth control.  Patient reports she wants to have Nexplanon inserted.  Appointment booked for next week in clinic.     , MD [facility]

## 2019-08-06 ENCOUNTER — Other Ambulatory Visit: Payer: Self-pay

## 2019-08-06 ENCOUNTER — Ambulatory Visit (INDEPENDENT_AMBULATORY_CARE_PROVIDER_SITE_OTHER): Payer: Medicaid Other | Admitting: Family Medicine

## 2019-08-06 ENCOUNTER — Encounter: Payer: Self-pay | Admitting: Family Medicine

## 2019-08-06 VITALS — BP 110/70 | HR 77 | Temp 98.4°F | Wt 268.0 lb

## 2019-08-06 DIAGNOSIS — Z308 Encounter for other contraceptive management: Secondary | ICD-10-CM | POA: Diagnosis not present

## 2019-08-06 DIAGNOSIS — Z3046 Encounter for surveillance of implantable subdermal contraceptive: Secondary | ICD-10-CM

## 2019-08-06 DIAGNOSIS — Z30019 Encounter for initial prescription of contraceptives, unspecified: Secondary | ICD-10-CM | POA: Diagnosis not present

## 2019-08-06 LAB — POCT URINE PREGNANCY: Preg Test, Ur: NEGATIVE

## 2019-08-06 NOTE — Progress Notes (Signed)
Nexplanon Insertion  No contraindications for placement.  No liver disease, no unexplained vaginal bleeding, no h/o breast cancer, no h/o blood clots.  No LMP recorded. (Menstrual status: Irregular Periods).  UHCG: negative   Risks & benefits of Nexplanon discussed The nexplanon device was purchased and supplied by [facility]. Packaging instructions supplied to patient Consent form signed  The patient denies any allergies to anesthetics or antiseptics.  Procedure: Pt was placed in supine position. The left arm was flexed at the elbow and externally rotated so that her wrist was parallel to her ear The medial epicondyle of the left arm was identified The insertions site was marked 8 cm proximal to the medial epicondyle The insertion site was cleaned with Betadine The area surrounding the insertion site was covered with a sterile drape 1% lidocaine was injected just under the skin at the insertion site extending 4 cm proximally. The sterile preloaded disposable Nexaplanon applicator was removed from the sterile packaging The applicator needle was inserted at a 30 degree angle at 8 cm proximal to the medial epicondyle as marked The applicator was lowered to a horizontal position and advanced just under the skin for the full length of the needle The slider on the applicator was retracted fully while the applicator remained in the same position, then the applicator was removed. The implant was confirmed via palpation as being in position The implant position was demonstrated to the patient Pressure dressing was applied to the patient.  The patient was instructed to removed the pressure dressing in 24 hrs.  The patient was advised to move slowly from a supine to an upright position  The patient denied any concerns or complaints  The patient was instructed to schedule a follow-up appt in 1 month and to call sooner if any concerns.  The patient acknowledged agreement and understanding of  the plan.  Dana Allan, MD Family Medicine Residency

## 2019-08-06 NOTE — Patient Instructions (Signed)
It was nice seeing today!  Nexplanon Instructions After Insertion   Keep bandage clean and dry for 24 hours   May use ice/Tylenol/Ibuprofen for soreness or pain   If you develop fever, drainage or increased warmth from incision site-contact office immediately    If you have any questions or concerns, please feel free to call the clinic.   Be well,  Dana Allan, MD [facility] Medicine Residency

## 2019-08-07 MED ORDER — ETONOGESTREL 68 MG ~~LOC~~ IMPL
68.0000 mg | DRUG_IMPLANT | Freq: Once | SUBCUTANEOUS | Status: AC
  Administered 2019-08-06: 68 mg via SUBCUTANEOUS

## 2019-08-07 NOTE — Addendum Note (Signed)
Addended by: Veronda Prude on: 08/07/2019 03:59 PM   Modules accepted: Orders

## 2019-08-08 ENCOUNTER — Telehealth: Payer: Self-pay | Admitting: Family Medicine

## 2019-08-08 ENCOUNTER — Other Ambulatory Visit: Payer: Self-pay

## 2019-08-08 ENCOUNTER — Encounter (HOSPITAL_COMMUNITY): Payer: Self-pay | Admitting: Emergency Medicine

## 2019-08-08 ENCOUNTER — Emergency Department (HOSPITAL_COMMUNITY)
Admission: EM | Admit: 2019-08-08 | Discharge: 2019-08-08 | Disposition: A | Discharge status: Home / Self Care | Payer: Medicaid Other | Attending: Emergency Medicine | Admitting: Emergency Medicine

## 2019-08-08 DIAGNOSIS — Z5321 Procedure and treatment not carried out due to patient leaving prior to being seen by health care provider: Secondary | ICD-10-CM | POA: Diagnosis not present

## 2019-08-08 DIAGNOSIS — R2232 Localized swelling, mass and lump, left upper limb: Secondary | ICD-10-CM | POA: Diagnosis not present

## 2019-08-08 NOTE — ED Triage Notes (Signed)
Patient's family reported redness/swelling at left upper arm onset Thursday this week at contraceptive implant site .

## 2019-08-08 NOTE — Telephone Encounter (Signed)
Emergency After Hours Line:  Caregiver Marianna Fuss [place]) calls after-hours line regards to concern for infection of her Nexplanon site.  She notes that her arm is very swollen, red, and hot to the touch.  She notes that the patient has had fevers and chills starting today as well.  She notes she went to the ED and they with a 10-hour wait.  She is lost on what to do.  She does not think the patient can wait to be seen on Monday.  Recommended that she go to either [facility] or [facility] emergency center as they may have lower ED wait times. She agreed and noted she was on her way there now.   Orpah Cobb, DO [facility], PGY3 08/08/2019 8:21 PM

## 2019-08-08 NOTE — ED Notes (Signed)
Pt mother said that she will go to where the implant was put in to be took out. Nurse explained to them that implant could not be took out at that the ER

## 2019-08-11 ENCOUNTER — Encounter: Payer: Self-pay | Admitting: Family Medicine

## 2019-08-11 ENCOUNTER — Ambulatory Visit (INDEPENDENT_AMBULATORY_CARE_PROVIDER_SITE_OTHER): Payer: Medicaid Other | Admitting: Family Medicine

## 2019-08-11 ENCOUNTER — Other Ambulatory Visit: Payer: Self-pay

## 2019-08-11 DIAGNOSIS — L089 Local infection of the skin and subcutaneous tissue, unspecified: Secondary | ICD-10-CM | POA: Diagnosis not present

## 2019-08-11 DIAGNOSIS — S40852A Superficial foreign body of left upper arm, initial encounter: Secondary | ICD-10-CM | POA: Diagnosis not present

## 2019-08-11 MED ORDER — DOXYCYCLINE HYCLATE 100 MG PO TABS
100.0000 mg | ORAL_TABLET | Freq: Two times a day (BID) | ORAL | 0 refills | Status: DC
Start: 2019-08-11 — End: 2019-09-08

## 2019-08-11 NOTE — Progress Notes (Signed)
    SUBJECTIVE:   CHIEF COMPLAINT / HPI:    Infected Nexplanon Lisa [name] had a Nexplanon placed on 7/29.  The day after it was placed, she began noticing some spreading redness and pain along her arm.  This worsened and she also began to notice some thick white drainage from her arm.  On 7/31, she went to the emergency room where she was not seen by a physician but was told by a nurse tech that the implant could not be removed in the ER so she could not be seen there.  Since 7/31, the redness and pain has been slowly improving with Tylenol and Motrin at home.  She continues to have some discomfort in her arm but it is no longer draining and the pain is not so bad as it once was.  Initially, she was interested in document having a Nexplanon removed but is willing to keep the Nexplanon if pain can be improved.  PERTINENT  PMH / PSH: Menorrhagia, obesity.  OBJECTIVE:   BP 104/60   Pulse 99   Ht 5\' 2"  (1.575 m)   Wt 271 lb 4 oz (123 kg)   SpO2 99%   BMI 49.61 kg/m    General: well appearing, obese young woman in no acute distress. Resp: breathing comfortably on room air no respiratory distress.  Arm: Well-healing procedural site without purulence or drainage at this time.  Mild erythema proximal to the incision with some tenderness and mild induration.  No axillary lymphadenopathy.   ASSESSMENT/PLAN:   Infected superficial foreign body of left upper arm No signs of systemic infection.  Very likely secondary to minor infection from the Nexplanon placement.  It does appear to already be improving but will treat for 7-day course. -Doxycycline 100 mg twice daily for 7 days -Return to clinic if pain and discomfort has not improved after 2 weeks     , MD [facility]

## 2019-08-11 NOTE — Patient Instructions (Signed)
Nexplanon: I do think that you have had an infection in your left arm from the Nexplanon.  I am sorry that you are unable to be seen and treated in the emergency room.  We can treat you with antibiotics starting today.  I have called in a week of an antibiotic called doxycycline.  He will take this once in the morning and once in the evening.  I think that we should treat your infection and wait for the inflammation to calm down then we consider whether or not you want to remove the Nexplanon.  Once the infection improves, I expect your pain to totally go away.

## 2019-08-11 NOTE — Assessment & Plan Note (Signed)
No signs of systemic infection.  Very likely secondary to minor infection from the Nexplanon placement.  It does appear to already be improving but will treat for 7-day course. -Doxycycline 100 mg twice daily for 7 days -Return to clinic if pain and discomfort has not improved after 2 weeks

## 2019-08-18 ENCOUNTER — Ambulatory Visit: Payer: Medicaid Other | Admitting: Family Medicine

## 2019-08-20 ENCOUNTER — Other Ambulatory Visit: Payer: Self-pay | Admitting: Family Medicine

## 2019-08-25 ENCOUNTER — Other Ambulatory Visit: Payer: Self-pay

## 2019-08-25 ENCOUNTER — Ambulatory Visit
Admission: RE | Admit: 2019-08-25 | Discharge: 2019-08-25 | Disposition: A | Discharge status: Home / Self Care | Payer: Medicaid Other | Source: Ambulatory Visit | Attending: Family Medicine | Admitting: Family Medicine

## 2019-08-25 DIAGNOSIS — N6452 Nipple discharge: Secondary | ICD-10-CM

## 2019-08-25 MED ORDER — GADOBUTROL 1 MMOL/ML IV SOLN: 9.0000 mL | Freq: Once | INTRAVENOUS | Status: DC | PRN

## 2019-09-03 ENCOUNTER — Ambulatory Visit (INDEPENDENT_AMBULATORY_CARE_PROVIDER_SITE_OTHER): Payer: Medicaid Other | Admitting: Family Medicine

## 2019-09-03 ENCOUNTER — Other Ambulatory Visit: Payer: Self-pay

## 2019-09-03 VITALS — BP 104/78 | HR 94 | Ht 62.0 in | Wt 268.4 lb

## 2019-09-03 DIAGNOSIS — S41102S Unspecified open wound of left upper arm, sequela: Secondary | ICD-10-CM | POA: Diagnosis not present

## 2019-09-03 MED ORDER — MUPIROCIN CALCIUM 2 % EX CREA: 1.0000 "application " | TOPICAL_CREAM | Freq: Two times a day (BID) | CUTANEOUS | 0 refills | Status: DC

## 2019-09-03 NOTE — Assessment & Plan Note (Signed)
Given that patient has been treated with doxycycline and completed course and had improvement, suspect that this is treated the infection.  Today does not appear infected, it does seem that patient has continued to squeeze the area which is likely causing trauma.  Advised patient against this.  We will also treat with topical Bactroban twice daily x2 weeks.  Advised to keep clean and dry with bandage over the area, bandage placed today in clinic.  Culture also obtained to ensure oral antibiotics are not further needed.  Return precautions given especially redness worsening drainage and fever.  Patient and her mother who was with her voiced understanding.

## 2019-09-03 NOTE — Progress Notes (Signed)
    SUBJECTIVE:   CHIEF COMPLAINT / HPI:   Left Arm Drainage Had nexplanon placed 7/29 Had redness and swelling, came 8/3 and saw Dr. Homero Fellers Had Doxycycline x7 day, got somewhat better but was still "a little irritated" No fevers Has been having discharge coming from the area, most recently this AM, mom shows a picture with a very small amount of fluid coming out, it appears slightly cloudy Has been feeling okay otherwise  PERTINENT  PMH / PSH: Asthma, seizure disroder  OBJECTIVE:   BP 104/78   Pulse 94   Ht 5\' 2"  (1.575 m)   Wt 268 lb 6.4 oz (121.7 kg)   SpO2 96%   BMI 49.09 kg/m    Physical Exam:  General: 24 y.o. female in NAD Lungs: Breathing comfortably on room air Skin: warm and dry Extremities: Left arm with 3 mm wound at insertion of Nexplanon site, no surrounding erythema, serous fluid only expressed from the wound, culture obtained, mild tenderness to palpation of Nexplanon   ASSESSMENT/PLAN:   Arm wound, left, sequela Given that patient has been treated with doxycycline and completed course and had improvement, suspect that this is treated the infection.  Today does not appear infected, it does seem that patient has continued to squeeze the area which is likely causing trauma.  Advised patient against this.  We will also treat with topical Bactroban twice daily x2 weeks.  Advised to keep clean and dry with bandage over the area, bandage placed today in clinic.  Culture also obtained to ensure oral antibiotics are not further needed.  Return precautions given especially redness worsening drainage and fever.  Patient and her mother who was with her voiced understanding.     30, DO [facility]

## 2019-09-03 NOTE — Patient Instructions (Signed)
Thank you for coming to see me today. It was a pleasure. Today we talked about:   Keep the area clean and dry.  Cover with a bandage when you are outside.  Apply the antibiotic ointment twice a day for the next two weeks.  If no improvement or if worsens and you start to have fevers, come back right away.  Do not pick at the area or try to squeeze anything out.  Please follow-up in 2 weeks if no improvement.  If you have any questions or concerns, please do not hesitate to call the office at [phone].  Best,   Luis Abed, DO

## 2019-09-05 ENCOUNTER — Encounter: Payer: Self-pay | Admitting: Family Medicine

## 2019-09-08 ENCOUNTER — Other Ambulatory Visit: Payer: Self-pay | Admitting: Family Medicine

## 2019-09-08 DIAGNOSIS — S41102S Unspecified open wound of left upper arm, sequela: Secondary | ICD-10-CM

## 2019-09-08 LAB — WOUND CULTURE: Organism ID, Bacteria: NONE SEEN

## 2019-09-08 MED ORDER — SULFAMETHOXAZOLE-TRIMETHOPRIM 800-160 MG PO TABS: 1.0000 | ORAL_TABLET | Freq: Two times a day (BID) | ORAL | 0 refills | Status: AC

## 2019-09-08 NOTE — Progress Notes (Signed)
Culture grew staph aureus.  Called mother and patient has been following instructions and not squeezing area, but still having purulent drainge.  Will treat with bactrim, as was treated with doxy previously and still had infection.  10 days of bactrim, but can extend to 14 days if she doesn't have resolution of symptoms.

## 2019-09-10 ENCOUNTER — Other Ambulatory Visit: Payer: Self-pay

## 2019-09-10 ENCOUNTER — Ambulatory Visit (INDEPENDENT_AMBULATORY_CARE_PROVIDER_SITE_OTHER): Payer: Medicaid Other | Admitting: Family Medicine

## 2019-09-10 DIAGNOSIS — Z3042 Encounter for surveillance of injectable contraceptive: Secondary | ICD-10-CM | POA: Diagnosis not present

## 2019-09-10 DIAGNOSIS — F79 Unspecified intellectual disabilities: Secondary | ICD-10-CM

## 2019-09-10 DIAGNOSIS — Z308 Encounter for other contraceptive management: Secondary | ICD-10-CM

## 2019-09-10 DIAGNOSIS — Z30018 Encounter for initial prescription of other contraceptives: Secondary | ICD-10-CM | POA: Diagnosis not present

## 2019-09-10 DIAGNOSIS — Z30430 Encounter for insertion of intrauterine contraceptive device: Secondary | ICD-10-CM

## 2019-09-10 NOTE — Progress Notes (Signed)
Patient here with request to remove her Nexplanon.  She has been seen twice in the outpatient setting for inflammation irritation or infection.  At one point she was placed on antibiotics.  She would like to have it out.  Her mother is on the phone during the interview and consent portion of the visit.  Mom would like her to get IUD placed.  Pertinent past medical history: Cognitive delay and her parent/mom is her legal guardian.  Examination of the site for Nexplanon placement reveals some excoriated skin.  She has an open area that is through the dermis about 4 5 mm in diameter that looks like it has been eroded.  Oddly this does not involve skin directly over the implant and does not involve either of the ends of the implant.  The implant is about 9 mm lateral to this.  There is a picture in the chart.  I think this is at most a foreign body reaction rather than infection.  I would have her discontinue her antibiotics.  We will remove at her request and will proceed with IUD placement which both she and her mother are in favor of.  PROCEDURE NOTE: NEXPLANON  REMOVAL Patient given informed consent and signed copy in the chart. LEFTarm area prepped and draped in the usual sterile fashion. Three cc of lidocaine without epinephrine 1% used for local anesthesia. A small stab incision was made close to the nexplanon with scalpel. Hemostats were used to withdraw the nexplanon. A small bandage was applied over a steri strip  No complications.Patient given follow up instructions should she experience redness, swelling at sight or fever in the next 24 hours. Patient was reminded this totally removes her nexplanon contraceptive devise. (she can now potentially conceive)  IUD INSERTION: Patient given informed consent, signed copy in the chart..  Negative pregnancy confirmed.  Appropriate time out taken.   Sterile instruments and technique was used. Cervix brought into view with use of speculum and then cleansed  three times with  betadine swabs.  A tenaculum was placed into the anterior lip of the cervix and a uterine sound was used to measure uterine size.   A Mirena IUD was placed into the endometrial cavity, deployed and secured. The applicator was removed. The strings were trimmed to 2 centimeters.   There were no complications and the patient tolerated the procedure well.   She was given handouts for post procedure instructions and information about the IUD including a card with the time of recommended removal. She was reminded that the IUD does not protect against sexually transmitted diseases.  Dr. Salvadore Dom removed Nexplanon and Dr. Dana Allan placed IUD under my supervision.

## 2019-09-10 NOTE — Patient Instructions (Addendum)
Nexplanon Instructions After Removal   Keep bandage clean and dry for 24 hours   May use ice/Tylenol/Ibuprofen for soreness or pain   If you develop fever, drainage or increased warmth from incision site-contact office immediately  IUD PLACEMENT POST-PROCEDURE INSTRUCTIONS  1. You may take Ibuprofen, Aleve or Tylenol for pain if needed.  Cramping should resolve within in 24 hours.  2. You may have a small amount of spotting.  You should wear a mini pad for the next few days.  3. You may have intercourse after 24 hours.  If you using this for birth control, it is effective immediately.  4. You need to call if you have any pelvic pain, fever, heavy bleeding or foul smelling vaginal discharge.  Irregular bleeding is common the first several months after having an IUD placed. You do not need to call for this reason unless you are concerned.  5. Shower or bathe as normal  6. You should have a follow-up appointment in 4-8 weeks for a re-check to make sure you are not having any problems.  Follow up PCP as needed

## 2019-09-11 MED ORDER — LEVONORGESTREL 20 MCG/24HR IU IUD
1.0000 | INTRAUTERINE_SYSTEM | Freq: Once | INTRAUTERINE | Status: AC
  Administered 2019-09-10: 1 via INTRAUTERINE

## 2019-09-11 NOTE — Addendum Note (Signed)
Addended by: Veronda Prude on: 09/11/2019 04:06 PM   Modules accepted: Orders

## 2019-09-27 ENCOUNTER — Other Ambulatory Visit: Payer: Self-pay

## 2019-09-27 ENCOUNTER — Ambulatory Visit
Admission: EM | Admit: 2019-09-27 | Discharge: 2019-09-27 | Discharge status: Against Medical Advice | Payer: Medicaid Other

## 2019-09-27 NOTE — ED Triage Notes (Signed)
[facility] staff went to call patient from the waiting room.  Patient did not answer. Left without being seen.

## 2019-10-06 ENCOUNTER — Ambulatory Visit: Payer: Medicaid Other | Admitting: Family Medicine

## 2019-10-13 ENCOUNTER — Encounter (HOSPITAL_BASED_OUTPATIENT_CLINIC_OR_DEPARTMENT_OTHER): Payer: Self-pay | Admitting: *Deleted

## 2019-10-13 ENCOUNTER — Emergency Department (HOSPITAL_BASED_OUTPATIENT_CLINIC_OR_DEPARTMENT_OTHER): Payer: Medicaid Other

## 2019-10-13 ENCOUNTER — Emergency Department (HOSPITAL_BASED_OUTPATIENT_CLINIC_OR_DEPARTMENT_OTHER)
Admission: EM | Admit: 2019-10-13 | Discharge: 2019-10-14 | Disposition: A | Discharge status: Home / Self Care | Payer: Medicaid Other | Attending: Emergency Medicine | Admitting: Emergency Medicine

## 2019-10-13 ENCOUNTER — Other Ambulatory Visit: Payer: Self-pay

## 2019-10-13 DIAGNOSIS — S93492A Sprain of other ligament of left ankle, initial encounter: Secondary | ICD-10-CM | POA: Insufficient documentation

## 2019-10-13 DIAGNOSIS — W108XXA Fall (on) (from) other stairs and steps, initial encounter: Secondary | ICD-10-CM | POA: Diagnosis not present

## 2019-10-13 DIAGNOSIS — J45909 Unspecified asthma, uncomplicated: Secondary | ICD-10-CM | POA: Insufficient documentation

## 2019-10-13 DIAGNOSIS — S99912A Unspecified injury of left ankle, initial encounter: Secondary | ICD-10-CM | POA: Diagnosis present

## 2019-10-13 MED ORDER — ACETAMINOPHEN 500 MG PO TABS
1000.0000 mg | ORAL_TABLET | Freq: Once | ORAL | Status: AC
  Administered 2019-10-14: 1000 mg via ORAL
  Filled 2019-10-13: qty 2

## 2019-10-13 NOTE — ED Triage Notes (Signed)
She was running up stairs, slipped and fell. Swelling and pain to her left ankle.
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
## 2019-10-13 NOTE — ED Notes (Signed)
Pt informed registration she was leaving but immediatly changed her mind.

## 2019-10-13 NOTE — ED Notes (Signed)
Updated patient on the wait time. She states her ankle is numb and wanting scan results. Reassured patient to wait to see provider.

## 2019-10-15 ENCOUNTER — Ambulatory Visit (INDEPENDENT_AMBULATORY_CARE_PROVIDER_SITE_OTHER): Payer: Medicaid Other | Admitting: Family Medicine

## 2019-10-15 ENCOUNTER — Encounter: Payer: Self-pay | Admitting: Family Medicine

## 2019-10-15 ENCOUNTER — Other Ambulatory Visit: Payer: Self-pay

## 2019-10-15 VITALS — BP 118/70 | Wt 269.0 lb

## 2019-10-15 DIAGNOSIS — S93492D Sprain of other ligament of left ankle, subsequent encounter: Secondary | ICD-10-CM

## 2019-10-15 DIAGNOSIS — S93492A Sprain of other ligament of left ankle, initial encounter: Secondary | ICD-10-CM | POA: Insufficient documentation

## 2019-10-15 NOTE — Assessment & Plan Note (Signed)
Grade 2.  She has already had x-rays that were negative.  Advised that given she is able to bear weight, would not recommend crutches at this time.  Advised that she should be doing early range of motion exercises.  Instructed her on exercises including working on spelling the alphabet with her ankles.  Advised to continue icing.  Also advised that she should not use walker boot any more than 5 to 6 days after her initial injury.  She and her mother voiced understanding.  She will return in 3 weeks for follow-up.  Can continue to use Tylenol as needed for pain.

## 2019-10-15 NOTE — Patient Instructions (Signed)
Thank you for coming to see me today. It was a pleasure. Today we talked about:   Continue to ice your ankle Work on the range of motion like we talked about Take tylenol as needed for pain  Please follow-up with me or PCP in 3 weeks.  If you have any questions or concerns, please do not hesitate to call the office at [phone].  Best,   Luis Abed, DO

## 2019-10-15 NOTE — Progress Notes (Signed)
    SUBJECTIVE:   CHIEF COMPLAINT / HPI:   Left ankle sprain Patient seen on 10/5 in ED for sprain of ATFL of left ankle She had x-rays at that time were negative for any acute fracture She was given a cam walker boot and advised to use Tylenol Has been using the walker boot and continues to have some pain She is wondering if she needs crutches Was told to follow-up with her PCP in 2 days, which is her reason for being here  PERTINENT  PMH / PSH: Asthma, GERD, seizure disorder/pseudoseizures, history of TBI, intellectual disability, PTSD, anxiety, depression  OBJECTIVE:   BP 118/70   Wt 269 lb (122 kg)   BMI 49.20 kg/m    Physical Exam:  General: 24 y.o. female in NAD Lungs: Breathing comfortably on room air Skin: warm and dry   Left ankle: - Inspection: Mild swelling of left lateral ankle compared to right.  No obvious deformity, erythema, ecchymosis, ulcers, calluses, blisters bilaterally - Palpation: Diffuse tenderness to palpation along lateral malleolus, especially over ATFL, tenderness palpation along fifth metatarsal at all areas - Strength: Normal strength with dorsiflexion, plantarflexion, inversion, and eversion of foot, patient does have pain with all strength testing; flexion and extension of toes bilaterally - ROM: ROM limited in all fields secondary to patient pain and cooperation on left - Neuro/vasc: NV intact distally bilaterally - Special Tests: Negative anterior drawer, normal inversion test on left, does have pain with both test.  Positive syndesmotic compression.     ASSESSMENT/PLAN:   Sprain of anterior talofibular ligament of left ankle Grade 2.  She has already had x-rays that were negative.  Advised that given she is able to bear weight, would not recommend crutches at this time.  Advised that she should be doing early range of motion exercises.  Instructed her on exercises including working on spelling the alphabet with her ankles.  Advised to  continue icing.  Also advised that she should not use walker boot any more than 5 to 6 days after her initial injury.  She and her mother voiced understanding.  She will return in 3 weeks for follow-up.  Can continue to use Tylenol as needed for pain.     Unknown Jim, DO [facility]
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
## 2019-11-27 ENCOUNTER — Encounter: Payer: Self-pay | Admitting: Student in an Organized Health Care Education/Training Program

## 2019-11-27 ENCOUNTER — Other Ambulatory Visit: Payer: Self-pay

## 2019-11-27 ENCOUNTER — Ambulatory Visit (INDEPENDENT_AMBULATORY_CARE_PROVIDER_SITE_OTHER): Payer: Medicaid Other | Admitting: Student in an Organized Health Care Education/Training Program

## 2019-11-27 DIAGNOSIS — Z3049 Encounter for surveillance of other contraceptives: Secondary | ICD-10-CM | POA: Diagnosis not present

## 2019-11-27 DIAGNOSIS — Z91013 Allergy to seafood: Secondary | ICD-10-CM | POA: Diagnosis not present

## 2019-11-27 DIAGNOSIS — T7840XD Allergy, unspecified, subsequent encounter: Secondary | ICD-10-CM | POA: Diagnosis not present

## 2019-11-27 DIAGNOSIS — J4540 Moderate persistent asthma, uncomplicated: Secondary | ICD-10-CM

## 2019-11-27 MED ORDER — FLOVENT HFA 44 MCG/ACT IN AERO
1.0000 | INHALATION_SPRAY | Freq: Two times a day (BID) | RESPIRATORY_TRACT | 2 refills | Status: DC
Start: 2019-11-27 — End: 2020-11-29

## 2019-11-27 MED ORDER — EPINEPHRINE 0.3 MG/0.3ML IJ SOAJ: 0.3000 mg | Freq: Once | INTRAMUSCULAR | 1 refills | Status: DC | PRN

## 2019-11-27 NOTE — Patient Instructions (Signed)
It was a pleasure to see you today!  To summarize our discussion for this visit:  For your asthma, I am prescribing a twice daily medication to help lessen how much you are having night time symptoms and needing your inhaler. Continue to use your allergy medication.   For your allergies, I have sent in an extra epi pen as well as a referral for an allergy specialist.   I'm glad you agreed to try the IUD for a few more months. Let us know if you'd like to have it removed in the future.    Call the clinic at [phone] if your symptoms worsen or you have any concerns.   Thank you for allowing me to take part in your care,  Dr. Jamelle Rushing   Back Exercises These exercises help to make your trunk and back strong. They also help to keep the lower back flexible. Doing these exercises can help to prevent back pain or lessen existing pain.  If you have back pain, try to do these exercises 2-3 times each day or as told by your doctor.  As you get better, do the exercises once each day. Repeat the exercises more often as told by your doctor.  To stop back pain from coming back, do the exercises once each day, or as told by your doctor. Exercises Single knee to chest Do these steps 3-5 times in a row for each leg: 1. Lie on your back on a firm bed or the floor with your legs stretched out. 2. Bring one knee to your chest. 3. Grab your knee or thigh with both hands and hold them it in place. 4. Pull on your knee until you feel a gentle stretch in your lower back or buttocks. 5. Keep doing the stretch for 10-30 seconds. 6. Slowly let go of your leg and straighten it. Pelvic tilt Do these steps 5-10 times in a row: 1. Lie on your back on a firm bed or the floor with your legs stretched out. 2. Bend your knees so they point up to the ceiling. Your feet should be flat on the floor. 3. Tighten your lower belly (abdomen) muscles to press your lower back against the floor. This will make  your tailbone point up to the ceiling instead of pointing down to your feet or the floor. 4. Stay in this position for 5-10 seconds while you gently tighten your muscles and breathe evenly. Cat-cow Do these steps until your lower back bends more easily: 1. Get on your hands and knees on a firm surface. Keep your hands under your shoulders, and keep your knees under your hips. You may put padding under your knees. 2. Let your head hang down toward your chest. Tighten (contract) the muscles in your belly. Point your tailbone toward the floor so your lower back becomes rounded like the back of a cat. 3. Stay in this position for 5 seconds. 4. Slowly lift your head. Let the muscles of your belly relax. Point your tailbone up toward the ceiling so your back forms a sagging arch like the back of a cow. 5. Stay in this position for 5 seconds.  Press-ups Do these steps 5-10 times in a row: 1. Lie on your belly (face-down) on the floor. 2. Place your hands near your head, about shoulder-width apart. 3. While you keep your back relaxed and keep your hips on the floor, slowly straighten your arms to raise the top half of your body and lift  your shoulders. Do not use your back muscles. You may change where you place your hands in order to make yourself more comfortable. 4. Stay in this position for 5 seconds. 5. Slowly return to lying flat on the floor.  Bridges Do these steps 10 times in a row: 1. Lie on your back on a firm surface. 2. Bend your knees so they point up to the ceiling. Your feet should be flat on the floor. Your arms should be flat at your sides, next to your body. 3. Tighten your butt muscles and lift your butt off the floor until your waist is almost as high as your knees. If you do not feel the muscles working in your butt and the back of your thighs, slide your feet 1-2 inches farther away from your butt. 4. Stay in this position for 3-5 seconds. 5. Slowly lower your butt to the  floor, and let your butt muscles relax. If this exercise is too easy, try doing it with your arms crossed over your chest. Belly crunches Do these steps 5-10 times in a row: 1. Lie on your back on a firm bed or the floor with your legs stretched out. 2. Bend your knees so they point up to the ceiling. Your feet should be flat on the floor. 3. Cross your arms over your chest. 4. Tip your chin a little bit toward your chest but do not bend your neck. 5. Tighten your belly muscles and slowly raise your chest just enough to lift your shoulder blades a tiny bit off of the floor. Avoid raising your body higher than that, because it can put too much stress on your low back. 6. Slowly lower your chest and your head to the floor. Back lifts Do these steps 5-10 times in a row: 1. Lie on your belly (face-down) with your arms at your sides, and rest your forehead on the floor. 2. Tighten the muscles in your legs and your butt. 3. Slowly lift your chest off of the floor while you keep your hips on the floor. Keep the back of your head in line with the curve in your back. Look at the floor while you do this. 4. Stay in this position for 3-5 seconds. 5. Slowly lower your chest and your face to the floor. Contact a doctor if:  Your back pain gets a lot worse when you do an exercise.  Your back pain does not get better 2 hours after you exercise. If you have any of these problems, stop doing the exercises. Do not do them again unless your doctor says it is okay. Get help right away if:  You have sudden, very bad back pain. If this happens, stop doing the exercises. Do not do them again unless your doctor says it is okay. This information is not intended to replace advice given to you by your health care provider. Make sure you discuss any questions you have with your health care provider. Document Revised: 09/19/2017 Document Reviewed: 09/19/2017 Elsevier Patient Education  2020 [employer].

## 2019-11-27 NOTE — Progress Notes (Signed)
   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 Recent incident of possible food contamination of fish and an episode of anaphylaxis needing epi pen and benadryl.  Given benadryl, milk and epi pen 2 weeks ago from throat swelling after eating chicken cooked in oil that was used for fish. She had complete recovery and no symptoms currently. Can eat shrimp, crawfish and imitation crab legs without problems.  Last went to allergist about age 24.   Contraception- wants to remove her IUD due to perceived negative side effects IUD in place for about 2 months. Endorsing Thoracic back pain intermittently about once every 2 weeks. and blood clots with irregular periods. Intermittent. No other problems.    Asthma- using Albuterol every night. Used to have Flovent.  Used to take singulair- expired.  Takes daily allergy pills.  No episodes of status asthmaticus.   OBJECTIVE:   BP 110/70   Pulse 98   Ht 5\' 2"  (1.575 m)   Wt 260 lb 3.2 oz (118 kg)   SpO2 98%   BMI 47.59 kg/m   General: NAD, pleasant, able to participate in exam Respiratory: CTAB, normal effort, No wheezes, rales or rhonchi Back: tenderness to paraspinal musculature in thoracic area. No midline tenderness. Full ROM. Negative lumbar tenderness.  Skin: warm and dry, no rashes noted Neuro: alert and oriented, no focal deficits Psych: Normal affect and mood  ASSESSMENT/PLAN:   Asthma Refilled flovent for daily controller.  Continue to take daily allergy medication Can discuss with allergist to find triggers when going for allergy testing  Contraception management Discussed expectations for IUD in first few months of use Reassured patient that her back pain is very unlikely related to the IUD.  Given back exercises handout Patient agreed to continue with the IUD for a few more months and will readdress with PCP if she still wants it removed at that  time  Allergy history, seafood Described an episode of anaphylaxis from source not entirely sure. Completely recovered at this time.  Had to use epi pen. - refilled epi pen - refer to allergist      , DO [facility]

## 2019-11-29 DIAGNOSIS — Z91013 Allergy to seafood: Secondary | ICD-10-CM | POA: Insufficient documentation

## 2019-11-29 NOTE — Assessment & Plan Note (Signed)
Described an episode of anaphylaxis from source not entirely sure. Completely recovered at this time.  Had to use epi pen. - refilled epi pen - refer to allergist

## 2019-11-29 NOTE — Assessment & Plan Note (Signed)
Refilled flovent for daily controller.  Continue to take daily allergy medication Can discuss with allergist to find triggers when going for allergy testing

## 2019-11-29 NOTE — Assessment & Plan Note (Signed)
Discussed expectations for IUD in first few months of use Reassured patient that her back pain is very unlikely related to the IUD.  Given back exercises handout Patient agreed to continue with the IUD for a few more months and will readdress with PCP if she still wants it removed at that time

## 2019-12-10 ENCOUNTER — Ambulatory Visit: Payer: Medicaid Other | Admitting: Family Medicine

## 2019-12-17 ENCOUNTER — Ambulatory Visit: Payer: Medicaid Other | Admitting: Family Medicine

## 2019-12-17 ENCOUNTER — Ambulatory Visit (INDEPENDENT_AMBULATORY_CARE_PROVIDER_SITE_OTHER): Payer: Medicaid Other | Admitting: Family Medicine

## 2019-12-17 ENCOUNTER — Other Ambulatory Visit: Payer: Self-pay

## 2019-12-17 DIAGNOSIS — Z5329 Procedure and treatment not carried out because of patient's decision for other reasons: Secondary | ICD-10-CM

## 2019-12-17 NOTE — Progress Notes (Signed)
Patient was scheduled for an early morning appointment on 12/17/2019. She canceled this appointment shortly before the scheduled appointment time and rescheduled for the afternoon. The patient also was a no-show to her late afternoon appointment. She also missed an appointment with her PCP on 10/06/2019.  A letter informing the patient of the no-show policy was sent to the patient via MyChart as well as printed and mailed to the patient at the address on file.   Peggyann Shoals, DO [facility], PGY-3 12/22/2019 7:49 PM

## 2019-12-17 NOTE — Progress Notes (Deleted)
   Subjective:   Patient ID: Lisa [name]    DOB: 1995-09-01, 24 y.o. female   MRN: [id number]  Lisa [name] is a 24 y.o. female with a history of asthma, GERD, juvenile absence epilepsy, pseudoseizures, history of traumatic brain injury, acanthosis nigricans, acne vulgaris, bilateral headaches, chronic neck pain, constipation, intellectual disability, MDD, mood swings, morbid obesity, here for nausea and abdominal pain  Nausea, abdominal pain: Patient present with abdominal pain located  Last bowel movement LMP  Contraception Sick contacts Fevers, chills, vomiting, diarrhea, constipation, dysuria, frequency, urgency   Review of Systems:  Per HPI.   Objective:   There were no vitals taken for this visit. Vitals and nursing note reviewed.  General: pleasant ***, sitting comfortably in exam chair, well nourished, well developed, in no acute distress with non-toxic appearance HEENT: normocephalic, atraumatic, moist mucous membranes, oropharynx clear without erythema or exudate, TM normal bilaterally  Neck: supple, non-tender without lymphadenopathy CV: regular rate and rhythm without murmurs, rubs, or gallops, no lower extremity edema, 2+ radial and pedal pulses bilaterally Lungs: clear to auscultation bilaterally with normal work of breathing on room air Resp: breathing comfortably on room air, speaking in full sentences Abdomen: soft, non-tender, non-distended, no masses or organomegaly palpable, normoactive bowel sounds Skin: warm, dry, no rashes or lesions Extremities: warm and well perfused, normal tone MSK: ROM grossly intact, strength intact, gait normal Neuro: Alert and oriented, speech normal  Assessment & Plan:   No problem-specific Assessment & Plan notes found for this encounter.  No orders of the defined types were placed in this encounter.  No orders of the defined types were placed in this encounter.   Orpah Cobb, DO PGY-3, [facility] 12/17/2019 8:01 AM

## 2019-12-21 ENCOUNTER — Encounter: Payer: Self-pay | Admitting: Family Medicine

## 2019-12-22 DIAGNOSIS — Z91199 Patient's noncompliance with other medical treatment and regimen due to unspecified reason: Secondary | ICD-10-CM | POA: Insufficient documentation

## 2019-12-25 ENCOUNTER — Other Ambulatory Visit: Payer: Self-pay | Admitting: Family Medicine

## 2019-12-26 ENCOUNTER — Telehealth: Payer: Self-pay | Admitting: Family Medicine

## 2019-12-26 ENCOUNTER — Other Ambulatory Visit: Payer: Self-pay | Admitting: Family Medicine

## 2019-12-26 NOTE — Telephone Encounter (Signed)
Received OOH page from [employer] about her daughter Lisa [name].  She reports Lisa [name] has been having "mental health issues" and that she had to call the [facility] today. She denies suicidal ideation but she has not been taking her psychiatric medications and has been threatening people verbally. Shortly after starting this conversation someone from [facility] arrived to her house and she said "it's under control, now do not worry, she is being admitted to the [facility]". I explained she can call this number back at anytime if she does not get admitted to [facility] and I can arrange a follow up at [facility] next week. Mom expressed understanding and is happy with the plan.   Will forward to PCP Dr Pollie Meyer.  Towanda Octave, MD PGY 2, Family medicine

## 2019-12-28 ENCOUNTER — Other Ambulatory Visit: Payer: Self-pay | Admitting: *Deleted

## 2019-12-28 DIAGNOSIS — J4540 Moderate persistent asthma, uncomplicated: Secondary | ICD-10-CM

## 2019-12-28 NOTE — Telephone Encounter (Signed)
Spoke to patient's mother Landry Corporal per Pollie Meyer, who states that patient is currently taking medication. Patient experienced an "episode" this past weekend because she was out of the medication.  Glennie Hawk, CMA

## 2019-12-28 NOTE — Telephone Encounter (Signed)
Red team, see message about sertraline refill.  Need to confirm patient is actually taking this medication and has psych follow up in place - can you check with her and mom?  Thanks Latrelle Dodrill, MD

## 2019-12-28 NOTE — Telephone Encounter (Signed)
Red team,  Can you contact patient/mother to confirm she is still taking this medication? It appears they called the emergency line and spoke with Dr. Allena Katz recently and that patient was going to be admitted to [facility].  I want to be sure she is still on this medication and has follow up before refilling. I am out of the office this week, but Dr. Deirdre Priest is covering for me  Thanks Latrelle Dodrill, MD

## 2019-12-29 MED ORDER — MONTELUKAST SODIUM 10 MG PO TABS: 10.0000 mg | ORAL_TABLET | Freq: Every day | ORAL | 0 refills | Status: DC

## 2019-12-31 ENCOUNTER — Other Ambulatory Visit: Payer: Self-pay | Admitting: *Deleted

## 2019-12-31 MED ORDER — QUETIAPINE FUMARATE 25 MG PO TABS: 50.0000 mg | ORAL_TABLET | Freq: Two times a day (BID) | ORAL | 0 refills | Status: DC

## 2020-01-26 ENCOUNTER — Telehealth: Payer: Self-pay | Admitting: Family Medicine

## 2020-01-26 NOTE — Telephone Encounter (Signed)
Received call from OOH line. I called back on the 2 telephone numbers on epic however went to VM. Left ippa compliant VM.  Towanda Octave MD PGY-2, Family Medicine

## 2020-01-28 ENCOUNTER — Ambulatory Visit: Payer: Medicaid Other | Admitting: Family Medicine

## 2020-01-29 ENCOUNTER — Ambulatory Visit: Payer: Medicaid Other

## 2020-01-29 ENCOUNTER — Ambulatory Visit: Payer: Medicaid Other | Admitting: Allergy

## 2020-02-12 ENCOUNTER — Other Ambulatory Visit: Payer: Self-pay

## 2020-02-12 ENCOUNTER — Ambulatory Visit: Payer: Medicaid Other

## 2020-02-12 ENCOUNTER — Ambulatory Visit (INDEPENDENT_AMBULATORY_CARE_PROVIDER_SITE_OTHER): Payer: Medicaid Other | Admitting: Family Medicine

## 2020-02-12 VITALS — BP 113/90 | Temp 98.6°F | Ht 60.0 in

## 2020-02-12 DIAGNOSIS — R109 Unspecified abdominal pain: Secondary | ICD-10-CM

## 2020-02-12 DIAGNOSIS — K529 Noninfective gastroenteritis and colitis, unspecified: Secondary | ICD-10-CM

## 2020-02-12 LAB — POCT URINE PREGNANCY: Preg Test, Ur: NEGATIVE

## 2020-02-12 NOTE — Patient Instructions (Addendum)
Thank you for coming to see me today. It was a pleasure. Today we discussed your symptoms. It is due to food poisoning from eating food which was gone bad. Stay as hydrated as you can and eat bland foods for now. If you start vomiting and cannot keep down the fluids down then please go to the ER.   Please follow-up with your PCP if your symptoms worsen.  If you have any questions or concerns, please do not hesitate to call the office at [phone].   Best wishes,   Dr Allena Katz

## 2020-02-12 NOTE — Progress Notes (Deleted)
     SUBJECTIVE:   CHIEF COMPLAINT / HPI:   Lisa [name] is a 25 y.o. female presents with ***  Primary symptom:*** Duration:*** Severity:*** Associated symptoms:*** Fever? Tmax?: *** Sick contacts:*** Covid test:*** Covid vaccination(s):***  PERTINENT  PMH / PSH: asthma, epilepsy   OBJECTIVE:   There were no vitals taken for this visit.   General: Alert, no acute distress Cardio: Normal S1 and S2, RRR, no r/m/g Pulm: CTAB, normal work of breathing Abdomen: Bowel sounds normal. Abdomen soft and non-tender.  Extremities: No peripheral edema.  Neuro: Cranial nerves grossly intact   ASSESSMENT/PLAN:   No problem-specific Assessment & Plan notes found for this encounter.     Towanda Octave, MD PGY-2 [facility]

## 2020-02-12 NOTE — Progress Notes (Addendum)
    SUBJECTIVE:   Lisa [name] is a 25 yr old female who presents to C[facility] clinic for vomiting and diarrhea  Mom also present at visit  CHIEF COMPLAINT / HPI:   Primary symptom: unwell since yesterday after eating pork chops and chicken which had gone bad. Vomited x 2 episodes, yellow colour, containing food contents, no blood. Associated symptoms: watery diarrhea, headache, hoarsness of voice and dizziness Fever? Tmax?: none  Sick contacts: sister tested positive for covid 2 weeks ago. 2 nephews had similar symptoms after eating same food Covid test: Jan 23rd-negative  Covid vaccination(s): yes x 2 pfizer vaccines, waiting for booster Has eaten a meal today without vomiting. Able to drink fluids. Denies abdominal pain, dysuria, frequency, hematuria etc.  PERTINENT  PMH / PSH: asthma, GERD, seizure disorder  OBJECTIVE:   BP 113/90   Temp 98.6 F (37 C) (Oral)   Ht 5' (1.524 m)   BMI 50.82 kg/m    General: Alert, no acute distress Cardio: Normal S1 and S2, RRR, no r/m/g Pulm: CTAB, normal work of breathing Abdomen: Bowel sounds normal. Abdomen non distended, soft and non-tender.  Extremities: No peripheral edema.  Neuro: Cranial nerves grossly intact  ASSESSMENT/PLAN:   Gastroenteritis Most likely differential is gastroenteritis given sx of diarrhea and vomiting and recently eating gone off food. Family members also with similar sx. Considered pregnancy however u preg negative. Also considered COVID however less likely as she tested negative last week. Appendicitis lower on differential as there is no abdominal tenderness/guarding, fevers or urinary sx. Normal vital signs and pt is tolerating PO well which is reassuring. Reassured mom and pt that it will likely resolve in the next few days and to keep as hydrated as possibe and to eat simple and bland foods until infection passes. Return precautions provided.     Towanda Octave, MD PGY-2 [facility]

## 2020-02-13 DIAGNOSIS — K529 Noninfective gastroenteritis and colitis, unspecified: Secondary | ICD-10-CM | POA: Insufficient documentation

## 2020-02-13 NOTE — Assessment & Plan Note (Addendum)
Most likely differential is gastroenteritis given sx of diarrhea and vomiting and recently eating gone off food. Family members also with similar sx. Considered pregnancy however u preg negative. Also considered COVID however less likely as she tested negative last week. Appendicitis lower on differential as there is no abdominal tenderness/guarding, fevers or urinary sx. Normal vital signs and pt is tolerating PO well which is reassuring. Reassured mom and pt that it will likely resolve in the next few days and to keep as hydrated as possibe and to eat simple and bland foods until infection passes. Return precautions provided.

## 2020-02-16 ENCOUNTER — Ambulatory Visit: Payer: Medicaid Other | Admitting: Family Medicine

## 2020-02-19 ENCOUNTER — Telehealth: Payer: Self-pay | Admitting: Family Medicine

## 2020-02-19 NOTE — Telephone Encounter (Signed)
Returned call to patient.  She is in [facility] with her boyfriend.  Says she needs to help him pay bills, and she wanted to know if I think she is fit to care for her own affairs because her mother gets her check.  I advised I think she is not fit to care for her own affairs, and continues to need assistance with this.  Patient reports she is safe where she is, and her mother knows where she is.  Patient was pleasant and had no other questions for me.  Latrelle Dodrill, MD

## 2020-02-19 NOTE — Telephone Encounter (Signed)
Patient is calling and would like Dr Pollie Meyer to call her to discuss what she needs to do concerning her legal guardianship and her disability.   The best number to call back is [phone]

## 2020-03-14 ENCOUNTER — Ambulatory Visit: Payer: Medicaid Other

## 2020-03-16 ENCOUNTER — Encounter: Payer: Self-pay | Admitting: Student in an Organized Health Care Education/Training Program

## 2020-03-16 ENCOUNTER — Other Ambulatory Visit (HOSPITAL_COMMUNITY)
Admission: RE | Admit: 2020-03-16 | Discharge: 2020-03-16 | Disposition: A | Discharge status: Home / Self Care | Payer: Medicaid Other | Source: Ambulatory Visit | Attending: Family Medicine | Admitting: Family Medicine

## 2020-03-16 ENCOUNTER — Ambulatory Visit (INDEPENDENT_AMBULATORY_CARE_PROVIDER_SITE_OTHER): Payer: Medicaid Other | Admitting: Student in an Organized Health Care Education/Training Program

## 2020-03-16 ENCOUNTER — Other Ambulatory Visit: Payer: Self-pay

## 2020-03-16 VITALS — BP 115/75 | HR 85 | Ht 62.0 in | Wt 262.0 lb

## 2020-03-16 DIAGNOSIS — F79 Unspecified intellectual disabilities: Secondary | ICD-10-CM

## 2020-03-16 DIAGNOSIS — Z113 Encounter for screening for infections with a predominantly sexual mode of transmission: Secondary | ICD-10-CM | POA: Insufficient documentation

## 2020-03-16 DIAGNOSIS — R87612 Low grade squamous intraepithelial lesion on cytologic smear of cervix (LGSIL): Secondary | ICD-10-CM | POA: Insufficient documentation

## 2020-03-16 DIAGNOSIS — F323 Major depressive disorder, single episode, severe with psychotic features: Secondary | ICD-10-CM | POA: Diagnosis not present

## 2020-03-16 DIAGNOSIS — Z124 Encounter for screening for malignant neoplasm of cervix: Secondary | ICD-10-CM | POA: Insufficient documentation

## 2020-03-16 DIAGNOSIS — Z3049 Encounter for surveillance of other contraceptives: Secondary | ICD-10-CM

## 2020-03-16 LAB — POCT WET PREP (WET MOUNT)
Clue Cells Wet Prep Whiff POC: NEGATIVE
Trichomonas Wet Prep HPF POC: ABSENT

## 2020-03-16 MED ORDER — SERTRALINE HCL 50 MG PO TABS
50.0000 mg | ORAL_TABLET | Freq: Every day | ORAL | 0 refills | Status: DC
Start: 2020-03-16 — End: 2020-04-07

## 2020-03-16 MED ORDER — ARIPIPRAZOLE 10 MG PO TABS
10.0000 mg | ORAL_TABLET | Freq: Every day | ORAL | 0 refills | Status: DC
Start: 2020-03-16 — End: 2020-04-07
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
## 2020-03-16 NOTE — Progress Notes (Signed)
   S:  Spoke with patient and Lisa [name] about behavioral and safety concerns. [name] endorsed fear of self-harm behaviors. Patient expressed understanding of these fears. Initially she was requesting to remove IUD in favor of becoming pregnant but after some discussion with Lisa mom, she decided to have it remain.  Lisa [name] denied SI.   O:  General: no acute distress  A/P:  After some discussion separately with [name] on Lisa concerns for Lisa daughters safety, Dr. Pollie Meyer (PCP) and I went in to discuss the concerns with patient and address Lisa concerns for pap smear and IUD removal.  Dr. Pollie Meyer performed the pelvic exam and has placed additional documentation. - recommend obtaining and filing in Lisa chart the legal guardian paperwork for future encounters. - follow up with psychiatry  Lisa Bock, DO [facility]

## 2020-03-16 NOTE — Patient Instructions (Addendum)
Start abilify 10mg  daily and sertraline 50mg  daily. Follow up with me in 2 weeks, sooner if needed.  Call Dr. office to schedule an appointment with a psychiatrist and also a therapist.  Call with any questions.  Be well, Dr. 

## 2020-03-16 NOTE — Assessment & Plan Note (Signed)
Repeat pap obtained today, along with STD testing including HIV/RPR, gc/chl/trich and wet prep.

## 2020-03-16 NOTE — Assessment & Plan Note (Signed)
With behavioral complications lately, while not on any medications. I am very concerned about her impulse behaviors of meeting people online, and shared these concerns with patient today (as did her mother). Recommendations: - restart abilify and zoloft at lower doses (abilify 10mg  daily, zoloft 50mg  daily) - call Dr. office to schedule with both psychiatrist and therapist - advised to stop meeting people online/in person whom she does not know - follow up with me in 2 weeks, discussed return precautions in the event she has any worsening mood while restarting medications.

## 2020-03-16 NOTE — Assessment & Plan Note (Signed)
Strongly encouraged patient to keep IUD in place. She had scheduled this visit out of wanting to conceive a child. With patient's mental health history and intellectual delay, I have serious concerns about her ability to adequately take care of a child and suspect she would not be eligible to have custody of her child if she did give birth to a child. Patient elected to keep IUD in place today.

## 2020-03-16 NOTE — Assessment & Plan Note (Signed)
Asked mother to give [place] a copy of guardianship paperwork so we have it on file, in the event that patient's expressed wishes are at odds with mom's desires for patient.

## 2020-03-17 LAB — CERVICOVAGINAL ANCILLARY ONLY
Chlamydia: NEGATIVE
Comment: NEGATIVE
Comment: NEGATIVE
Comment: NORMAL
Neisseria Gonorrhea: NEGATIVE
Trichomonas: NEGATIVE

## 2020-03-17 LAB — RPR: RPR Ser Ql: NONREACTIVE

## 2020-03-17 LAB — HIV ANTIBODY (ROUTINE TESTING W REFLEX): HIV Screen 4th Generation wRfx: NONREACTIVE

## 2020-03-18 LAB — CYTOLOGY - PAP
Diagnosis: NEGATIVE
Diagnosis: REACTIVE

## 2020-04-01 ENCOUNTER — Telehealth: Payer: Self-pay | Admitting: Family Medicine

## 2020-04-01 NOTE — Telephone Encounter (Signed)
Reviewed form and placed in PCP's box for completion.  .Michelle R Simpson, CMA  

## 2020-04-01 NOTE — Telephone Encounter (Signed)
CSL Plasma form dropped off for at front desk for completion.  Verified that patient section of form has been completed.  Last DOS/WCC with PCP was 03/16/20.  Placed form in team folder to be completed by clinical staff.  Vilinda Blanks

## 2020-04-07 ENCOUNTER — Ambulatory Visit (INDEPENDENT_AMBULATORY_CARE_PROVIDER_SITE_OTHER): Payer: Medicaid Other

## 2020-04-07 ENCOUNTER — Other Ambulatory Visit: Payer: Self-pay

## 2020-04-07 ENCOUNTER — Encounter: Payer: Self-pay | Admitting: Family Medicine

## 2020-04-07 ENCOUNTER — Ambulatory Visit (INDEPENDENT_AMBULATORY_CARE_PROVIDER_SITE_OTHER): Payer: Medicaid Other | Admitting: Family Medicine

## 2020-04-07 ENCOUNTER — Other Ambulatory Visit (HOSPITAL_COMMUNITY)
Admission: RE | Admit: 2020-04-07 | Discharge: 2020-04-07 | Disposition: A | Discharge status: Home / Self Care | Payer: Medicaid Other | Source: Ambulatory Visit | Attending: Family Medicine | Admitting: Family Medicine

## 2020-04-07 VITALS — BP 112/72 | HR 83 | Wt 262.8 lb

## 2020-04-07 DIAGNOSIS — Z113 Encounter for screening for infections with a predominantly sexual mode of transmission: Secondary | ICD-10-CM | POA: Diagnosis present

## 2020-04-07 DIAGNOSIS — J4540 Moderate persistent asthma, uncomplicated: Secondary | ICD-10-CM | POA: Diagnosis not present

## 2020-04-07 DIAGNOSIS — Z7251 High risk heterosexual behavior: Secondary | ICD-10-CM

## 2020-04-07 DIAGNOSIS — Z23 Encounter for immunization: Secondary | ICD-10-CM

## 2020-04-07 DIAGNOSIS — F323 Major depressive disorder, single episode, severe with psychotic features: Secondary | ICD-10-CM | POA: Diagnosis not present

## 2020-04-07 MED ORDER — SERTRALINE HCL 50 MG PO TABS: 50.0000 mg | ORAL_TABLET | Freq: Every day | ORAL | 0 refills | Status: DC

## 2020-04-07 MED ORDER — ALBUTEROL SULFATE HFA 108 (90 BASE) MCG/ACT IN AERS: 2.0000 | INHALATION_SPRAY | Freq: Four times a day (QID) | RESPIRATORY_TRACT | 1 refills | Status: DC | PRN

## 2020-04-07 MED ORDER — ARIPIPRAZOLE 10 MG PO TABS: 10.0000 mg | ORAL_TABLET | Freq: Every day | ORAL | 0 refills | Status: DC

## 2020-04-07 NOTE — Progress Notes (Signed)
  Date of Visit: 04/07/2020   SUBJECTIVE:   HPI:  Lisa [name] presents today for follow up, accompanied by her mom who helps provide the history.   Vaginal discharge - recently sexually active with a new female partner, did not use condoms the entire time. Since then has had some vaginal discharge and some lower abdominal pain/lower back pain. No abnormal bleeding. Has IUD in place. This was a consensual sexual encounter. Would like std screening today. Denies oral or anal sex.  Mood - taking abilify 10mg  daily and sertraline 50mg  daily since last visit. Things are going much better since then. Getting along better with mom, and not meeting people online. Mom feels better about how things are going. Patient denies SI/HI.  Asthma - needs albuterol inhaler refilled, had some wheezing last night but improved now  OBJECTIVE:   BP 112/72   Pulse 83   Wt 262 lb 12.8 oz (119.2 kg)   LMP 03/08/2020 (Approximate)   SpO2 96%   BMI 48.07 kg/m  Gen: no acute distress, pleasant, cooperative HEENT: normocephalic, atraumatic  Lungs: normal work of breathing  Neuro: alert, grossly nonfocal, speech normal. GU: normal appearing external genitalia without lesions. Vagina is moist with normal discharge. Cervix normal in appearance. No cervical motion tenderness or tenderness on bimanual exam. No adnexal masses. Chaperone present: , CMA   ASSESSMENT/PLAN:   Health maintenance:  -COVID booster given today  High risk sexual behavior Discussed safe sex, importance of condoms Has IUD for contraception Testing today - gc/chl/trich, HIV viral qualitative (given recent new partner), RPR  Asthma Refill albuterol inhaler  MDD (major depressive disorder), single episode, severe with psychotic features (HCC) Mood improved on abilify and sertraline. Continue these medications. Gave phone # for [facility] so she can schedule with a psychiatrist  Note - patient had brought in paperwork for  plasma donation. Advised I don't think plasma donation is a good idea at this time given recent new unprotected sex and testing for STDs including bloodborne pathogen. Patient and mom in agreement.  FOLLOW UP: Follow up in 1 mo for above issues  Maree Erie [facility], MD [facility]

## 2020-04-07 NOTE — Patient Instructions (Signed)
It was great to see you again today!  Testing for infections  Refilled albuterol inhaler  Call the Behavioral Medicine center to schedule an appointment. Their phone number is: [phone].  COVID booster today  Follow up with me in 1 month, sooner if needed  Be well, Dr. Pollie Meyer

## 2020-04-08 ENCOUNTER — Ambulatory Visit: Payer: Medicaid Other

## 2020-04-08 ENCOUNTER — Telehealth: Payer: Self-pay | Admitting: Family Medicine

## 2020-04-08 DIAGNOSIS — A549 Gonococcal infection, unspecified: Secondary | ICD-10-CM

## 2020-04-08 LAB — HIV-1/HIV-2 QUALITATIVE RNA
HIV-1 RNA, Qualitative: NONREACTIVE
HIV-2 RNA, Qualitative: NONREACTIVE

## 2020-04-08 LAB — CERVICOVAGINAL ANCILLARY ONLY
Chlamydia: POSITIVE — AB
Comment: NEGATIVE
Comment: NEGATIVE
Comment: NORMAL
Neisseria Gonorrhea: POSITIVE — AB
Trichomonas: NEGATIVE

## 2020-04-08 LAB — RPR: RPR Ser Ql: NONREACTIVE

## 2020-04-08 MED ORDER — DOXYCYCLINE HYCLATE 100 MG PO TABS: 100.0000 mg | ORAL_TABLET | Freq: Two times a day (BID) | ORAL | 0 refills | Status: DC

## 2020-04-08 MED ORDER — CEFTRIAXONE SODIUM 500 MG IJ SOLR: 500.0000 mg | Freq: Once | INTRAMUSCULAR | Status: DC

## 2020-04-08 NOTE — Telephone Encounter (Signed)
Called patient regarding STD results positive for both gonorrhea and chlamydia.  Advised: -She will need nurse visit for treatment with ceftriaxone 500mg  IM once. Reviewed record, patient has tolerated cephalosporins in the past despite listed PCN allergy -Will rx doxycycline 100mg  twice daily x7 days -She needs to inform partner(s) so they can be tested and treated -Abstain from sex until 7 days after both patient and partner(s) have been treated -Reiterated condoms as most effective way to prevent STD transmission  Routing to [facility] RN team.  , MD

## 2020-04-08 NOTE — Telephone Encounter (Signed)
Spoke with patient about this during her visit yesterday I do not recommend she donate plasma given high risk sexual behaviors Will discard form at patient's request  Latrelle Dodrill, MD

## 2020-04-08 NOTE — Telephone Encounter (Signed)
Patient coming today for treatment in RN clinic, will arrive by 4pm. Latrelle Dodrill, MD

## 2020-04-10 DIAGNOSIS — Z7251 High risk heterosexual behavior: Secondary | ICD-10-CM | POA: Insufficient documentation

## 2020-04-10 NOTE — Assessment & Plan Note (Signed)
Discussed safe sex, importance of condoms Has IUD for contraception Testing today - gc/chl/trich, HIV viral qualitative (given recent new partner), RPR

## 2020-04-10 NOTE — Assessment & Plan Note (Signed)
Mood improved on abilify and sertraline. Continue these medications. Gave phone # for [facility] so she can schedule with a psychiatrist

## 2020-04-10 NOTE — Assessment & Plan Note (Signed)
Refill albuterol inhaler ?

## 2020-04-11 ENCOUNTER — Other Ambulatory Visit: Payer: Self-pay | Admitting: Family Medicine

## 2020-04-11 ENCOUNTER — Ambulatory Visit (INDEPENDENT_AMBULATORY_CARE_PROVIDER_SITE_OTHER): Payer: Medicaid Other

## 2020-04-11 ENCOUNTER — Other Ambulatory Visit: Payer: Self-pay

## 2020-04-11 DIAGNOSIS — A549 Gonococcal infection, unspecified: Secondary | ICD-10-CM | POA: Diagnosis present

## 2020-04-11 MED ORDER — CEFTRIAXONE SODIUM 250 MG IJ SOLR
250.0000 mg | Freq: Once | INTRAMUSCULAR | Status: AC
  Administered 2020-04-11: 250 mg via INTRAMUSCULAR

## 2020-04-14 ENCOUNTER — Telehealth: Payer: Self-pay

## 2020-04-14 ENCOUNTER — Ambulatory Visit (HOSPITAL_COMMUNITY)
Admission: EM | Admit: 2020-04-14 | Discharge: 2020-04-14 | Disposition: A | Discharge status: Home / Self Care | Payer: Medicaid Other | Attending: Psychiatry | Admitting: Psychiatry

## 2020-04-14 ENCOUNTER — Encounter: Payer: Self-pay | Admitting: Psychology

## 2020-04-14 ENCOUNTER — Other Ambulatory Visit: Payer: Self-pay

## 2020-04-14 DIAGNOSIS — F4323 Adjustment disorder with mixed anxiety and depressed mood: Secondary | ICD-10-CM | POA: Insufficient documentation

## 2020-04-14 NOTE — ED Notes (Signed)
Belongings in locker color coded orange. 

## 2020-04-14 NOTE — ED Notes (Signed)
Discharge instructions provided and Pt stated understanding. Personal belongings returned from orange locker. Pt alert, orient and ambulatory. Pt escorted to the front lobby. Safety maintained.
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
## 2020-04-14 NOTE — Discharge Instructions (Addendum)
Please come to [facility] (this facility) during walk in hours for appointment with psychiatrist for further medication management and for therapy.   Walk in hours are 8-11 AM Monday through Thursday for medication management.It is first come, first -serve; it is best to arrive by 7:00 AM. On Friday from 1 pm to 4 pm for therapy intake only. Please arrive by 12:00 pm as it is  first come, first -serve.   When you arrive please go upstairs for your appointment. If you are unsure of where to go, inform the front desk that you are here for a walk in appointment and they will assist you with directions upstairs.  Address:  [address], in [place], [address] Ph: [phone]   
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
## 2020-04-15 NOTE — Progress Notes (Signed)
Patient in nurse clinic today for STD treatment of gonorrhea.    Patient advised to abstain from sex for 7-10 days after treatment of self and partner.     Ceftriaxone 250 mg IM x 2 given in RUOQ and LUOQ per Dr. Valorie Roosevelt orders.  Patient observed 15 minutes in office.  No reaction noted.   Patient to follow up in 2-3 months for re-screening.     STD report form fax completed and faxed to [facility] Department at [phone] (STD department).     Patient   Veronda Prude, RN

## 2020-04-21 ENCOUNTER — Telehealth: Payer: Self-pay
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
## 2020-05-05 ENCOUNTER — Ambulatory Visit (INDEPENDENT_AMBULATORY_CARE_PROVIDER_SITE_OTHER): Payer: Medicaid Other | Admitting: Family Medicine

## 2020-05-05 ENCOUNTER — Other Ambulatory Visit: Payer: Self-pay

## 2020-05-05 ENCOUNTER — Ambulatory Visit: Payer: Medicaid Other | Admitting: Family Medicine

## 2020-05-05 ENCOUNTER — Encounter: Payer: Self-pay | Admitting: Family Medicine

## 2020-05-05 DIAGNOSIS — F323 Major depressive disorder, single episode, severe with psychotic features: Secondary | ICD-10-CM

## 2020-05-09 ENCOUNTER — Ambulatory Visit: Payer: Medicaid Other
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
## 2020-05-09 NOTE — Assessment & Plan Note (Signed)
Again stressed need for follow up with [facility] resources given to patient at [facility]. Patient would be best served by seeing a psychiatrist, and mom is doing all she can to get patient into psych visit, but has been unable to get patient to agree to go. Currently mood seems overall stable. Lisa [name] admits she feels better when taking medication, advised she continue to take it daily.
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
## 2020-05-10 ENCOUNTER — Ambulatory Visit: Payer: Medicaid Other
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
## 2020-05-11 ENCOUNTER — Telehealth: Payer: Self-pay | Admitting: Family Medicine

## 2020-05-11 ENCOUNTER — Ambulatory Visit (INDEPENDENT_AMBULATORY_CARE_PROVIDER_SITE_OTHER): Payer: Medicaid Other | Admitting: Student in an Organized Health Care Education/Training Program

## 2020-05-11 ENCOUNTER — Other Ambulatory Visit: Payer: Self-pay

## 2020-05-11 VITALS — BP 122/69 | HR 89 | Ht 60.0 in | Wt 261.2 lb

## 2020-05-11 DIAGNOSIS — G5711 Meralgia paresthetica, right lower limb: Secondary | ICD-10-CM | POA: Diagnosis not present

## 2020-05-11 DIAGNOSIS — N926 Irregular menstruation, unspecified: Secondary | ICD-10-CM | POA: Diagnosis present

## 2020-05-11 LAB — POCT URINE PREGNANCY: Preg Test, Ur: NEGATIVE

## 2020-05-11 NOTE — Patient Instructions (Signed)
It was a pleasure to see you today!  To summarize our discussion for this visit:  Your pregnancy test is negative. Your abnormal cycle could be a result of restarting certain medications. I would recommend continuing to take your medications and using your IUD and we can continue to monitor your menstrual cycle for another month or two. If you are having really heavy bleeding or non-stop bleeding, you can come back sooner.   For your leg pain, consider wearing some loose fitting pants and seeing if the pain will improve.    Call the clinic at [phone] if your symptoms worsen or you have any concerns.   Thank you for allowing me to take part in your care,  Dr. Jamelle Rushing
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
## 2020-05-11 NOTE — Telephone Encounter (Signed)
Returned call to patient. She had questions about her bleeding that were addressed in her visit with Dr. Dareen Piano today. I reiterated that irregular bleeding could be due to recent medication changes or stress. Recommend giving it more time to see if things become more regular.  She does say she is taking her medications - provided positive reinforcement of this.  Patient appreciative. Latrelle Dodrill, MD

## 2020-05-11 NOTE — Telephone Encounter (Signed)
Pt walked in requesting Dr Pollie Meyer call her at [phone].  No other information given

## 2020-05-13 DIAGNOSIS — G5711 Meralgia paresthetica, right lower limb: Secondary | ICD-10-CM | POA: Insufficient documentation

## 2020-05-13 DIAGNOSIS — N926 Irregular menstruation, unspecified: Secondary | ICD-10-CM | POA: Insufficient documentation
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
## 2020-05-13 NOTE — Assessment & Plan Note (Signed)
On way out of room after encounter, patient brought up a tingling sensation she has on her right anterior thigh intermittently and gestured to the area consistent with meralgia paresthetica. Patient is currently wearing very tight jean shorts that were cutting into her soft tissue significantly so I suggested this type of restriction is the likely cause and asked her to try wearing looser fitting clothing to avoid nerve compression and let us know if it does not get better.

## 2020-05-18 ENCOUNTER — Ambulatory Visit: Payer: Medicaid Other | Admitting: Family Medicine

## 2020-05-19 ENCOUNTER — Other Ambulatory Visit (HOSPITAL_COMMUNITY)
Admission: RE | Admit: 2020-05-19 | Discharge: 2020-05-19 | Disposition: A | Discharge status: Home / Self Care | Payer: Medicaid Other | Source: Ambulatory Visit | Attending: Family Medicine | Admitting: Family Medicine

## 2020-05-19 ENCOUNTER — Other Ambulatory Visit: Payer: Self-pay

## 2020-05-19 ENCOUNTER — Encounter: Payer: Self-pay | Admitting: Family Medicine

## 2020-05-19 ENCOUNTER — Ambulatory Visit (INDEPENDENT_AMBULATORY_CARE_PROVIDER_SITE_OTHER): Payer: Medicaid Other | Admitting: Family Medicine

## 2020-05-19 VITALS — BP 124/93 | HR 103 | Ht 62.0 in | Wt 262.0 lb

## 2020-05-19 DIAGNOSIS — R102 Pelvic and perineal pain unspecified side: Secondary | ICD-10-CM

## 2020-05-19 DIAGNOSIS — R100 Acute abdomen: Secondary | ICD-10-CM | POA: Diagnosis not present

## 2020-05-19 DIAGNOSIS — N739 Female pelvic inflammatory disease, unspecified: Secondary | ICD-10-CM | POA: Diagnosis present

## 2020-05-19 LAB — POCT URINALYSIS DIP (MANUAL ENTRY)
Bilirubin, UA: NEGATIVE
Glucose, UA: NEGATIVE mg/dL
Ketones, POC UA: NEGATIVE mg/dL
Nitrite, UA: NEGATIVE
Protein Ur, POC: NEGATIVE mg/dL
Spec Grav, UA: 1.02 (ref 1.010–1.025)
Urobilinogen, UA: 1 E.U./dL
pH, UA: 6.5 (ref 5.0–8.0)

## 2020-05-19 LAB — POCT URINE PREGNANCY: Preg Test, Ur: NEGATIVE

## 2020-05-19 LAB — POCT UA - MICROSCOPIC ONLY

## 2020-05-19 MED ORDER — CEFTRIAXONE SODIUM 500 MG IJ SOLR
500.0000 mg | Freq: Once | INTRAMUSCULAR | Status: AC
  Administered 2020-05-19: 500 mg via INTRAMUSCULAR

## 2020-05-19 MED ORDER — METRONIDAZOLE 500 MG PO TABS: 500.0000 mg | ORAL_TABLET | Freq: Two times a day (BID) | ORAL | 0 refills | Status: DC

## 2020-05-19 MED ORDER — DOXYCYCLINE HYCLATE 100 MG PO TABS: 100.0000 mg | ORAL_TABLET | Freq: Two times a day (BID) | ORAL | 0 refills | Status: DC

## 2020-05-19 NOTE — Patient Instructions (Addendum)
Treating for infection of pelvic area today. Will wait on swab to come back, as well as urine culture  Shot of antibiotics given today Additionally, 2 new medications: - doxycycline 100mg  twice daily for 14 days - metronidazole 500mg  twice daily for 14 days   Also scheduling pelvic ultrasound.  Follow up with me in 2 weeks to see how you are doing, sooner if not getting better  Be well, Dr. 
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
## 2020-05-20 LAB — CERVICOVAGINAL ANCILLARY ONLY
Chlamydia: NEGATIVE
Comment: NEGATIVE
Comment: NEGATIVE
Comment: NORMAL
Neisseria Gonorrhea: POSITIVE — AB
Trichomonas: NEGATIVE

## 2020-05-22 LAB — URINE CULTURE

## 2020-05-25 ENCOUNTER — Ambulatory Visit (HOSPITAL_COMMUNITY): Payer: Medicaid Other

## 2020-06-07 ENCOUNTER — Ambulatory Visit (INDEPENDENT_AMBULATORY_CARE_PROVIDER_SITE_OTHER): Payer: Medicaid Other | Admitting: Family Medicine

## 2020-06-07 DIAGNOSIS — Z5329 Procedure and treatment not carried out because of patient's decision for other reasons: Secondary | ICD-10-CM

## 2020-06-07 DIAGNOSIS — T887XXA Unspecified adverse effect of drug or medicament, initial encounter: Secondary | ICD-10-CM | POA: Insufficient documentation

## 2020-06-07 NOTE — Progress Notes (Signed)
Patient was a no-show to her appointment again today 06/07/2020.    Peggyann Shoals, DO [facility], PGY-3 06/07/2020 4:48 PM

## 2020-06-21 NOTE — Progress Notes (Signed)
Patient was a no-show to her 06/22/2020 appt.    Peggyann Shoals, DO [facility], PGY-3 06/22/2020 1:47 PM

## 2020-06-22 ENCOUNTER — Ambulatory Visit (INDEPENDENT_AMBULATORY_CARE_PROVIDER_SITE_OTHER): Payer: Medicaid Other | Admitting: Family Medicine

## 2020-06-22 DIAGNOSIS — Z3049 Encounter for surveillance of other contraceptives: Secondary | ICD-10-CM

## 2020-06-22 DIAGNOSIS — Z5329 Procedure and treatment not carried out because of patient's decision for other reasons: Secondary | ICD-10-CM

## 2020-07-21 ENCOUNTER — Ambulatory Visit: Payer: Medicaid Other | Admitting: Student

## 2020-08-01 ENCOUNTER — Other Ambulatory Visit: Payer: Self-pay

## 2020-08-01 ENCOUNTER — Encounter: Payer: Self-pay | Admitting: Family Medicine

## 2020-08-01 ENCOUNTER — Ambulatory Visit (INDEPENDENT_AMBULATORY_CARE_PROVIDER_SITE_OTHER): Payer: Medicaid Other | Admitting: Family Medicine

## 2020-08-01 VITALS — BP 109/85 | Ht 62.0 in | Wt 260.4 lb

## 2020-08-01 DIAGNOSIS — Z3049 Encounter for surveillance of other contraceptives: Secondary | ICD-10-CM | POA: Diagnosis not present

## 2020-08-01 DIAGNOSIS — Z3200 Encounter for pregnancy test, result unknown: Secondary | ICD-10-CM

## 2020-08-01 LAB — POCT URINE PREGNANCY: Preg Test, Ur: NEGATIVE

## 2020-08-01 NOTE — Assessment & Plan Note (Signed)
-  Upreg negative today  -stressed importance of having contraception in place, after extensive discussion on other options, patient encouraged to keep IUD in which she was in agreement with, states that she does not want to have a pregnancy at this time -counseling on safe sex practices -f/u as appropriate with PCP

## 2020-08-01 NOTE — Patient Instructions (Addendum)
It was great seeing you today!  Today we discussed contraception. After extensive discussion, I am glad you decided to keep the IUD in place, this should be good for at least another 3-4 years. Please make sure to use protection with each sexual encounter. Your pregnancy test was negative today as well.   Please schedule an annual visit at your earliest convenience with your PCP, Dr. Pollie Meyer.   Please follow up at your next scheduled appointment, if anything arises between now and then, please don't hesitate to contact our office.   Thank you for allowing [place] to be a part of your medical care!  Thank you, Dr. Robyne Peers

## 2020-08-01 NOTE — Progress Notes (Addendum)
    SUBJECTIVE:   CHIEF COMPLAINT / HPI:   Patient presents wanting to discuss IUD removal. Had Mirena IUD placed about 11 months ago. Although it is not time for replacement of contraception, she feels that sometimes her periods are lighter flow. She has tried the depo injection previously along with the nexplanon which was also not in favor of. She reports being sexually active with 1 female partner, does not use protection because she states that her and her partner trust each other. Last sexually active 2 days ago. She is currently not trying to get pregnancy but reports that her partner does. Regardless she is not in favor of getting pregnant at this time. Denies fever, chills, vaginal discharge, itching, rash, dysuria, abdominal pain, pelvic pain or other associated symptoms.    OBJECTIVE:   BP 109/85   Ht 5\' 2"  (1.575 m)   Wt 260 lb 6.4 oz (118.1 kg)   LMP 06/24/2020   BMI 47.63 kg/m   General: Patient well-appearing, in no acute distress. CV: RRR, no murmurs or gallops auscultated Resp: CTAB Abdomen: soft, non-tender, nondistended, presence of bowel sounds Psych: mood appropriate   ASSESSMENT/PLAN:   Contraception management -Upreg negative today  -stressed importance of having contraception in place, after extensive discussion on other options, patient encouraged to keep IUD in which she was in agreement with, states that she does not want to have a pregnancy at this time -counseling on safe sex practices -f/u as appropriate with PCP    06/26/2020, DO [facility]

## 2020-08-18 ENCOUNTER — Telehealth: Payer: Self-pay

## 2020-08-18 NOTE — Telephone Encounter (Signed)
Noted and agree Lisa [name] Lisa Chelly Dombeck, MD  

## 2020-08-18 NOTE — Telephone Encounter (Signed)
Patient calls nurse line regarding chest pain that started on Tuesday 08/16/20. Patient reports that pain is intermittent.   Patient is not currently having chest pain or SHOB. Denies nausea, vomiting, sweating or radiation into arms.   Mother is also present during conversation and reports that patient has history of acid reflux. Mother reports that she will take nexium and the pain will subside. Reports that pain is worse after eating spicy or acidic foods.   Patient has appointment with PCP on 8/25. Provided with return/ ED precautions.   Veronda Prude, RN

## 2020-08-31 ENCOUNTER — Encounter: Payer: Self-pay | Admitting: Family Medicine

## 2020-09-01 ENCOUNTER — Other Ambulatory Visit: Payer: Self-pay

## 2020-09-01 ENCOUNTER — Encounter: Payer: Self-pay | Admitting: Family Medicine

## 2020-09-01 ENCOUNTER — Ambulatory Visit (INDEPENDENT_AMBULATORY_CARE_PROVIDER_SITE_OTHER): Payer: Medicaid Other | Admitting: Family Medicine

## 2020-09-01 VITALS — BP 101/61 | HR 87 | Wt 263.0 lb

## 2020-09-01 DIAGNOSIS — F39 Unspecified mood [affective] disorder: Secondary | ICD-10-CM | POA: Diagnosis not present

## 2020-09-01 DIAGNOSIS — M25571 Pain in right ankle and joints of right foot: Secondary | ICD-10-CM

## 2020-09-01 NOTE — Progress Notes (Signed)
  Date of Visit: 09/01/2020   SUBJECTIVE:   HPI:  Lisa [name] presents today for follow up of ankle pain and to discuss mood. She arrived 20+ minutes late to her visit, so our visit was abbreviated.  Mood - has not taken medications at all lately. Previously was doing well on sertraline and abilify but reports she ran out of these. Endorses having anger issues and reports she feels like she should be hospitalized. Denies thoughts of harming herself or others. Has not followed up with [facility] for medication management or therapy but states she is now willing.  Ankle pain - twisted R ankle a few weeks ago. Did not see doctor after. Still having pain in ankle and reports she feels it is impacting her walking.   OBJECTIVE:   BP 101/61   Pulse 87   Wt 263 lb (119.3 kg)   SpO2 100%   BMI 48.10 kg/m  Gen: no acute distress, pleasant, cooperative HEENT: normocephalic, atraumatic  Psych: normal range of affect, well groomed, speech normal in rate and volume, normal eye contact  Neuro: alert, grossly nonfocal, speech normal Ext: R ankle without edema. Some tenderness along anterior surface. No instability appreciated. Full ROM.  ASSESSMENT/PLAN:   Refer to sports medicine for ankle pain, could not address in detail today due to late arrival. Suspect ligamental strain, may benefit from POCUS exam there   Mood issues - patient requesting hospitalization but denies any SI/HI. Recommend she go to [facility] for evaluation after this visit. She and mother are agreeable to this plan. Provided phone number and address for her.   Scheduled follow up visit with me in several weeks to follow up on other chronic issues. Notably, needs retesting for gc/chl after treated several months ago.  [place] J. Pollie Meyer, MD [facility]

## 2020-09-01 NOTE — Patient Instructions (Signed)
Please go to [facility] today  Phone: [phone]  Address: [address] [place], [place] [address]  Will refer to sports medicine  Be well, Dr. Pollie Meyer

## 2020-09-05 ENCOUNTER — Telehealth: Payer: Self-pay

## 2020-09-05 NOTE — Telephone Encounter (Signed)
Patients mother calls nurse line stating Tonishia is going to "crazy." Mother called so we could hear her in the background. It was very hard to hear, however you sense there was some sort of commotion. I advised mother to call the police. Mother agreed with plan.

## 2020-09-05 NOTE — Telephone Encounter (Signed)
Noted, agree with contacting police. Latrelle Dodrill, MD

## 2020-09-27 ENCOUNTER — Ambulatory Visit: Payer: Medicaid Other | Admitting: Family Medicine

## 2020-09-30 ENCOUNTER — Ambulatory Visit: Payer: Medicaid Other | Admitting: Family Medicine

## 2020-09-30 NOTE — Progress Notes (Deleted)
     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  She notes that discharge appears ***.  She *** vaginal odors.  She denies vaginal pruritis, abnormal vaginal bleeding, dysuria, hematuria, frequency, abdominal pain pelvic pain, nausea, vomiting, fevers or new rash.   She has used *** with *** relief.   *** history of STIs.  She *** sexually active and *** use condoms.  Contraception: ***.  LMP ***.  She does not douche.  ***  Flowsheet Row Office Visit from 09/01/2020 in [facility]  PHQ-9 Total Score 3        Health Maintenance Due  Topic   INFLUENZA VACCINE       PERTINENT  PMH / PSH:   OBJECTIVE:   There were no vitals taken for this visit.   General: Alert, no acute distress Cardio: Normal S1 and S2, RRR, no r/m/g Pulm: CTAB, normal work of breathing Abdomen: Bowel sounds normal. Abdomen soft and non-tender.  Extremities: No peripheral edema.  Neuro: Cranial nerves grossly intact   ASSESSMENT/PLAN:   No problem-specific Assessment & Plan notes found for this encounter.    Towanda Octave, MD PGY-3 [facility]

## 2020-10-06 ENCOUNTER — Other Ambulatory Visit: Payer: Self-pay

## 2020-10-06 ENCOUNTER — Telehealth: Payer: Self-pay

## 2020-10-06 ENCOUNTER — Ambulatory Visit (HOSPITAL_COMMUNITY)
Admission: EM | Admit: 2020-10-06 | Discharge: 2020-10-06 | Disposition: A | Discharge status: Home / Self Care | Payer: Medicaid Other

## 2020-10-06 NOTE — Telephone Encounter (Signed)
Agree with emergency room evaluation.  Latrelle Dodrill, MD

## 2020-10-06 NOTE — Telephone Encounter (Signed)
Mother calls nurse line regarding cocners with [name]. Reports that she does not feel safe with how Travonna is acting. Mother reports that she has been unable to get Fartun out of her car this morning. Mother is currently driving on [address] and patient is now trying to get out of moving vehicle. Advised mother to pull over and call 911 due to safety concerns. Mother reports that she will drive her to [facility] ED for further evaluation as the police tend to worsen the situation.   Veronda Prude, RN

## 2020-10-06 NOTE — Progress Notes (Signed)
Pt left facility after being triaged and before she was seen by a provider. Pt is discharged at this time.

## 2020-10-13 ENCOUNTER — Telehealth (HOSPITAL_COMMUNITY): Payer: Self-pay | Admitting: Family Medicine

## 2020-10-13 NOTE — BH Assessment (Signed)
Care Management - Follow Up [facility] Discharges   Writer made contact with the patient. Writer provided patient with the following resources.  Writer informed patient that she is able to come to [facility] without an appointment Mon thru Thurs from 8am to 11am.  Writer stressed that these appointments are first come first serve.    

## 2020-10-25 ENCOUNTER — Telehealth: Payer: Self-pay | Admitting: Psychology

## 2020-10-25 ENCOUNTER — Ambulatory Visit (HOSPITAL_COMMUNITY)
Admission: EM | Admit: 2020-10-25 | Discharge: 2020-10-25 | Disposition: A | Discharge status: Home / Self Care | Payer: Medicaid Other | Attending: Registered Nurse | Admitting: Registered Nurse

## 2020-10-25 DIAGNOSIS — R4586 Emotional lability: Secondary | ICD-10-CM | POA: Diagnosis present

## 2020-10-25 DIAGNOSIS — Z5181 Encounter for therapeutic drug level monitoring: Secondary | ICD-10-CM | POA: Insufficient documentation

## 2020-10-25 DIAGNOSIS — F4011 Social phobia, generalized: Secondary | ICD-10-CM | POA: Insufficient documentation

## 2020-10-25 DIAGNOSIS — F4310 Post-traumatic stress disorder, unspecified: Secondary | ICD-10-CM | POA: Insufficient documentation

## 2020-10-25 DIAGNOSIS — Z79899 Other long term (current) drug therapy: Secondary | ICD-10-CM

## 2020-10-25 NOTE — ED Provider Notes (Signed)
[facility] Medical Screening Exam  Patient Name: Lisa [name] MRN: [id number] Date of Evaluation: 10/25/20 Chief Complaint:   Diagnosis:  Final diagnoses:  Medication management  Generalized social phobia  PTSD (post-traumatic stress disorder)    History of Present illness: Lisa [name] is a 25 y.o. female patient presented to [facility] as a walk in accompanied by her mother with complaints of mood swings and unable to control her anger.  Also requesting outpatient psychiatric services for medication management  Lisa [name], 25 y.o., female patient seen face to face by this provider, consulted with Dr. Earlene Plater; and chart reviewed on 10/25/20.  On evaluation Lisa [name] reports she came in today because she wanted to get referral/resources for outpatient psychiatric services.  Patient reports that her primary care physician is currently prescribing her psychotropic medications.  Patient reporting that she has trouble controlling her anger and feels that she also needs counseling.  Patient denies suicidal/self-harm/homicidal ideation, psychosis, paranoia. During evaluation Lisa [name] is sitting upright in chair in no acute distress.  She is alert/oriented x 4; calm/cooperative; and mood congruent with affect.  She is speaking in a clear tone at moderate volume, and normal pace; with good eye contact.  Her thought process is coherent and relevant; There is no indication that she is currently responding to internal/external stimuli or experiencing delusional thought content; and she has denied suicidal/self-harm/homicidal ideation, psychosis, and paranoia.   Patient has remained calm throughout assessment and has answered questions appropriately.    At this time Lisa [name] is educated and verbalizes understanding of mental health resources and other crisis services in the community.  She is instructed to call 911 and present to the nearest emergency  room should she experience any suicidal/homicidal ideation, auditory/visual/hallucinations, or detrimental worsening of her mental health condition.  She was a also advised by Clinical research associate that she could call the toll-free phone back of Medicaid card to speak with care coordinator who could also assist with services    Psychiatric Specialty Exam  Presentation  General Appearance:Appropriate for Environment; Casual  Eye Contact:Good  Speech:Clear and Coherent; Normal Rate  Speech Volume:Normal  Handedness:Right   Mood and Affect  Mood:Euthymic  Affect:Congruent   Thought Process  Thought Processes:Coherent; Goal Directed  Descriptions of Associations:Intact  Orientation:Full (Time, Place and Person)  Thought Content:WDL; Logical  Diagnosis of Schizophrenia or Schizoaffective disorder in past: Yes  Duration of Psychotic Symptoms: Greater than six months  Hallucinations:None  Ideas of Reference:None  Suicidal Thoughts:No  Homicidal Thoughts:No   Sensorium  Memory:Immediate Good; Recent Good; Remote Good  Judgment:Intact  Insight:Good   Executive Functions  Concentration:Good  Attention Span:Good  Recall:Good  Fund of Knowledge:Good  Language:Good   Psychomotor Activity  Psychomotor Activity:Normal   Assets  Assets:Communication Skills; Desire for Improvement; Financial Resources/Insurance; Housing; Resilience; Social Support   Sleep  Sleep:Good  Number of hours:  No data recorded  Nutritional Assessment (For OBS and FBC admissions only) Has the patient had a weight loss or gain of 10 pounds or more in the last 3 months?: No Has the patient had a decrease in food intake/or appetite?: No Does the patient have dental problems?: No Does the patient have eating habits or behaviors that may be indicators of an eating disorder including binging or inducing vomiting?: No Has the patient recently lost weight without trying?: 0 Has the patient been  eating poorly because of a decreased appetite?: 0 Malnutrition Screening Tool Score: 0  Physical Exam: Physical Exam Vitals and nursing note reviewed. Exam conducted with a chaperone present.  Constitutional:      General: She is not in acute distress.    Appearance: Normal appearance. She is not ill-appearing.  Cardiovascular:     Rate and Rhythm: Normal rate.  Pulmonary:     Effort: Pulmonary effort is normal.  Musculoskeletal:        General: Normal range of motion.     Cervical back: Normal range of motion.  Skin:    General: Skin is warm and dry.  Neurological:     Mental Status: She is alert and oriented to person, place, and time.  Psychiatric:        Attention and Perception: Attention normal. She does not perceive auditory or visual hallucinations.        Mood and Affect: Mood and affect normal.        Speech: Speech normal.        Behavior: Behavior normal. Behavior is cooperative.        Thought Content: Thought content is not paranoid or delusional. Thought content does not include homicidal or suicidal ideation.        Cognition and Memory: Cognition and memory normal.        Judgment: Judgment normal.   Review of Systems  Constitutional: Negative.   HENT: Negative.    Eyes: Negative.   Respiratory: Negative.    Cardiovascular: Negative.   Gastrointestinal: Negative.   Genitourinary: Negative.   Musculoskeletal: Negative.   Skin: Negative.   Neurological: Negative.   Endo/Heme/Allergies: Negative.   Psychiatric/Behavioral:  Depression: Stable. Hallucinations: Denies. Substance abuse: Denies. Suicidal ideas: Denies. Nervous/anxious: Stable. Insomnia: Denies.        Reporting her main concern is getting set up with outpatient psychiatric services and get help controlling her anger.  Blood pressure (!) 119/54, pulse 79, temperature 98.3 F (36.8 C), temperature source Oral, resp. rate 16, SpO2 100 %. There is no height or weight on file to calculate  BMI.  Musculoskeletal: Strength & Muscle Tone: within normal limits Gait & Station: normal Patient leans: N/A   [facility] MSE Discharge Disposition for Follow up and Recommendations: Based on my evaluation the patient does not appear to have an emergency medical condition and can be discharged with resources and follow up care in outpatient services for Medication Management and Individual Therapy   Lisa Lininger, NP 10/25/2020, 6:26 PM

## 2020-10-25 NOTE — Telephone Encounter (Signed)
Pt's mother called clinic very concerned about pt safety. She reported pt has been engaging in risky behavior as well as being a harm to self. Pt is currently not taking medication. Pt's mother worried about her safety.  Pt's mother reported she has called the police in the past month about this.  Recommended pt be brought to [facility] (pt's mother knows where this is located) or to call police if things escalate.   Pt's mom voiced understanding and is agreeable to this plan.   Royetta Asal, PhD., LMFT

## 2020-10-25 NOTE — Progress Notes (Signed)
   10/25/20 1430  [facility] Triage Screening (Walk-ins at [facility] only)  How Did You Hear About [place]? Family/Friend  What Is the Reason for Your Visit/Call Today? Patient presents with mother and step-father at the recommendation of her PCP.  Patient is diagnosed with Schizoaffective Disorder and has been off of medications for an unknown amount of time.  Per mother, patient's friends have discouraged her from taking medications and often tell her "you're grown and can make your own decisions."  Patient has been increasingly irritable and aggressive with family, and and she has been engaging in unsafe behaviors.  She lives mother and receives SSDI.  Per mother, patient  has been trying to apply for jobs and will schedule interviews without making a plan with her for transportation.  Shas also been meeting random people online and will get cash from them for uber rides to interviews and various other places.   Last night, patient got in the car with a stranger and at some point messaged her mother letting her know the lady drove her to [place].  Patient states the lady was drinking and driving.  Her mother called police and when they made contact with patient, she gave a false name and she states they "put me in cuffs."   Patient is open to re-starting medications, acknowledging "I need help."  She reports the medications were helpful in the past.  Her step-father is concerned medicaitons will need to be adjusted, as he feels they haven't been as effective as they have been. Patient is denying SI, HI and AVH.  She is open to referral for outpatient treatment.  She has expressed interest in discussing the possibility of LAI medication.  How Long Has This Been Causing You Problems? > than 6 months  Have You Recently Had Any Thoughts About Hurting Yourself? No  Are You Planning to Commit Suicide/Harm Yourself At This time? No  Have you Recently Had Thoughts About Hurting Someone Karolee Ohs? No  Are You Planning To Harm Someone  At This Time? No  Are you currently experiencing any auditory, visual or other hallucinations? Yes  Please explain the hallucinations you are currently experiencing: Patient reports seeing grandmother, Corrie Dandy, who is telling her things about her mother's childhood. Patient has discussions with her and another older lady.  Have You Used Any Alcohol or Drugs in the Past 24 Hours? No  Do you have any current medical co-morbidities that require immediate attention? No  Clinician description of patient physical appearance/behavior: cooperative, pleasant  What Do You Feel Would Help You the Most Today? Treatment for Depression or other mood problem  If access to [facility] was not available, would you have sought care in the Emergency Department? No  Determination of Need Routine (7 days)  Options For Referral Intensive Outpatient Therapy;Medication Management;Outpatient Therapy

## 2020-10-25 NOTE — ED Notes (Signed)
Pt discharged with  AVS.  AVS reviewed prior to discharge.  Pt alert, oriented, and ambulatory.  Safety maintained.  °

## 2020-10-26 ENCOUNTER — Encounter (HOSPITAL_COMMUNITY): Payer: Self-pay | Admitting: Physician Assistant

## 2020-10-26 ENCOUNTER — Ambulatory Visit (INDEPENDENT_AMBULATORY_CARE_PROVIDER_SITE_OTHER): Payer: Medicaid Other | Admitting: Physician Assistant

## 2020-10-26 ENCOUNTER — Other Ambulatory Visit: Payer: Self-pay

## 2020-10-26 VITALS — BP 139/88 | HR 86 | Ht 62.0 in | Wt 256.0 lb

## 2020-10-26 DIAGNOSIS — F4310 Post-traumatic stress disorder, unspecified: Secondary | ICD-10-CM | POA: Diagnosis not present

## 2020-10-26 DIAGNOSIS — F411 Generalized anxiety disorder: Secondary | ICD-10-CM

## 2020-10-26 DIAGNOSIS — F39 Unspecified mood [affective] disorder: Secondary | ICD-10-CM

## 2020-10-26 MED ORDER — LAMOTRIGINE 25 MG PO TABS: 25.0000 mg | ORAL_TABLET | Freq: Every day | ORAL | 0 refills | Status: DC

## 2020-10-26 MED ORDER — ARIPIPRAZOLE 5 MG PO TABS: 5.0000 mg | ORAL_TABLET | Freq: Every day | ORAL | 1 refills | Status: DC

## 2020-10-26 MED ORDER — LAMOTRIGINE 25 MG PO TABS: 50.0000 mg | ORAL_TABLET | Freq: Every day | ORAL | 1 refills | Status: DC
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
## 2020-10-27 ENCOUNTER — Telehealth (HOSPITAL_COMMUNITY): Payer: Self-pay | Admitting: Family Medicine

## 2020-10-27 NOTE — BH Assessment (Signed)
Care Management - Follow Up [facility] Discharges   Writer attempted to make contact with patient today and was unsuccessful.  Writer left a HIPPA compliant voice message.   Per chart review, patient completed a follow up appt with NP, Eddie on 10-26-20

## 2020-10-29 ENCOUNTER — Encounter (HOSPITAL_COMMUNITY): Payer: Self-pay | Admitting: Physician Assistant

## 2020-11-03 ENCOUNTER — Other Ambulatory Visit: Payer: Self-pay

## 2020-11-03 ENCOUNTER — Ambulatory Visit (INDEPENDENT_AMBULATORY_CARE_PROVIDER_SITE_OTHER): Payer: Self-pay | Admitting: Family Medicine

## 2020-11-03 ENCOUNTER — Telehealth: Payer: Self-pay | Admitting: Family Medicine

## 2020-11-03 ENCOUNTER — Other Ambulatory Visit (HOSPITAL_COMMUNITY)
Admission: RE | Admit: 2020-11-03 | Discharge: 2020-11-03 | Disposition: A | Discharge status: Home / Self Care | Payer: Medicaid Other | Source: Ambulatory Visit | Attending: Family Medicine | Admitting: Family Medicine

## 2020-11-03 VITALS — BP 118/74 | HR 72 | Ht 62.0 in | Wt 258.2 lb

## 2020-11-03 DIAGNOSIS — Z113 Encounter for screening for infections with a predominantly sexual mode of transmission: Secondary | ICD-10-CM | POA: Diagnosis present

## 2020-11-03 DIAGNOSIS — Z7251 High risk heterosexual behavior: Secondary | ICD-10-CM

## 2020-11-03 DIAGNOSIS — T8332XA Displacement of intrauterine contraceptive device, initial encounter: Secondary | ICD-10-CM | POA: Insufficient documentation

## 2020-11-03 DIAGNOSIS — Z3200 Encounter for pregnancy test, result unknown: Secondary | ICD-10-CM

## 2020-11-03 LAB — POCT URINE PREGNANCY: Preg Test, Ur: NEGATIVE

## 2020-11-03 NOTE — Assessment & Plan Note (Signed)
Patient reports unprotected sex.  Discussed the importance of condoms today.  Testing for gonorrhea/chlamydia/trichomonas/HIV and RPR today.

## 2020-11-03 NOTE — Assessment & Plan Note (Signed)
IUD strings not visible on pelvic exam today.  Transvaginal ultrasound ordered to identify location of IUD.  Patient counseled on the importance of safe sex practices using condoms to prevent pregnancy.  Will determine neck steps in management after transvaginal ultrasound.  Return precautions given.

## 2020-11-03 NOTE — Patient Instructions (Addendum)
It was great seeing you today.  We checked you for pregnancy today and your pregnancy test was negative but we could not see your IUD strings.  We did not know that your IUD is in place and recommend that you use condoms for prevention of pregnancy until we make sure that your IUD is where it should be.  Regarding screening for STD collected samples but that will take several days to come back.  We will send the results to your MyChart.  If you have any questions or concerns please feel free to call the clinic.  I hope you have a wonderful afternoon!

## 2020-11-03 NOTE — Progress Notes (Signed)
Urine

## 2020-11-03 NOTE — Progress Notes (Signed)
    SUBJECTIVE:   CHIEF COMPLAINT / HPI:   Possible pregnancy Patient has IUD but is not sure that it is in place.  Would like strings checked.  Reports missed menses.  Reports that she has not had menses for the last 2 months.  She also cannot feel the strings from her IUD.  She has had unprotected sexual intercourse.  She denies any abnormal discharge but would like STD testing including blood testing.  Reports other symptoms including intermittent pelvic pain, intermittent low back pain, breast tenderness.   OBJECTIVE:   BP 118/74   Pulse 72   Ht 5\' 2"  (1.575 m)   Wt 258 lb 3.2 oz (117.1 kg)   SpO2 98%   BMI 47.23 kg/m   General: Well-appearing 25 year old female, no acute distress Cardiac: Regular rate and rhythm Abdomen: Soft, nontender, positive bowel sounds GU: Normal external female genitalia, normal internal vaginal tissue.  Cervix with noticeable ectopy, no visible strings, used brush to try and remove strings from cervix but was unsuccessful.  Mild amount of discharge  ASSESSMENT/PLAN:   IUD threads lost IUD strings not visible on pelvic exam today.  Transvaginal ultrasound ordered to identify location of IUD.  Patient counseled on the importance of safe sex practices using condoms to prevent pregnancy.  Will determine neck steps in management after transvaginal ultrasound.  Return precautions given.  High risk sexual behavior Patient reports unprotected sex.  Discussed the importance of condoms today.  Testing for gonorrhea/chlamydia/trichomonas/HIV and RPR today.     26, MD [facility]

## 2020-11-03 NOTE — Telephone Encounter (Signed)
Patient would like for whoever calls with her std test results to call her number that was just added to the chart. [phone].  I have marked it as the first number to call in demographics.   Please advise.

## 2020-11-04 ENCOUNTER — Telehealth: Payer: Self-pay | Admitting: Family Medicine

## 2020-11-04 LAB — CERVICOVAGINAL ANCILLARY ONLY
Bacterial Vaginitis (gardnerella): POSITIVE — AB
Candida Glabrata: NEGATIVE
Candida Vaginitis: NEGATIVE
Chlamydia: NEGATIVE
Comment: NEGATIVE
Comment: NEGATIVE
Comment: NEGATIVE
Comment: NEGATIVE
Comment: NEGATIVE
Comment: NORMAL
Neisseria Gonorrhea: NEGATIVE
Trichomonas: NEGATIVE

## 2020-11-04 LAB — RPR: RPR Ser Ql: NONREACTIVE

## 2020-11-04 LAB — HIV ANTIBODY (ROUTINE TESTING W REFLEX): HIV Screen 4th Generation wRfx: NONREACTIVE

## 2020-11-04 MED ORDER — METRONIDAZOLE 500 MG PO TABS: 500.0000 mg | ORAL_TABLET | Freq: Two times a day (BID) | ORAL | 0 refills | Status: DC

## 2020-11-04 NOTE — Telephone Encounter (Signed)
Called patient to discuss wet prep results.  Discussed the fact that BV is not an STI but that we can still treat it.  Sent prescription for metronidazole to patient's pharmacy.  No further questions or concerns.

## 2020-11-08 ENCOUNTER — Telehealth (HOSPITAL_COMMUNITY): Payer: Self-pay | Admitting: Physician Assistant

## 2020-11-08 NOTE — Telephone Encounter (Signed)
Message received by Earnestine Mealing. Provider to contact patient and patient's parents to discuss medication management options.

## 2020-11-08 NOTE — Telephone Encounter (Signed)
Patients mother and stepfather Marianna Fuss and Ethelene Browns) called to leave a voicemail for patient's provider regarding concerns about her behaviors. She is still taking her medications as directed, and they are looking forward to the increase that were discussed during evaluation. Patient's mother and stepfather are still very concerned about her behaviors and stated that they may come in during walk-in hours on tomorrow (11/09/20) if they can encourage pt.

## 2020-11-09 ENCOUNTER — Other Ambulatory Visit: Payer: Self-pay | Admitting: Family Medicine

## 2020-11-14 NOTE — Telephone Encounter (Signed)
Refill not appropriate, if having further symptoms needs to be seen Latrelle Dodrill, MD

## 2020-11-15 ENCOUNTER — Other Ambulatory Visit: Payer: Self-pay

## 2020-11-15 ENCOUNTER — Ambulatory Visit
Admission: RE | Admit: 2020-11-15 | Discharge: 2020-11-15 | Disposition: A | Discharge status: Home / Self Care | Payer: Medicaid Other | Source: Ambulatory Visit | Attending: Family Medicine | Admitting: Family Medicine

## 2020-11-15 ENCOUNTER — Ambulatory Visit (INDEPENDENT_AMBULATORY_CARE_PROVIDER_SITE_OTHER): Payer: Medicaid Other | Admitting: Family Medicine

## 2020-11-15 VITALS — BP 111/80 | HR 90 | Ht 62.0 in | Wt 260.2 lb

## 2020-11-15 DIAGNOSIS — Z23 Encounter for immunization: Secondary | ICD-10-CM | POA: Diagnosis not present

## 2020-11-15 DIAGNOSIS — T8332XA Displacement of intrauterine contraceptive device, initial encounter: Secondary | ICD-10-CM

## 2020-11-15 DIAGNOSIS — M25531 Pain in right wrist: Secondary | ICD-10-CM | POA: Diagnosis not present

## 2020-11-15 NOTE — Patient Instructions (Addendum)
Thank you for coming to see me today. It was a pleasure. Today we talked about:   I have placed an order for wrist xray.  Please go to [facility] at [employer] [facility] or at [facility] to have this completed.  You do not need an appointment, but if you would like to call them beforehand, their number is [phone].  We will contact you with your results afterwards.   Wrist splint, please go to the Sports medicine building  Please follow-up with PCP in 1 week  If you have any questions or concerns, please do not hesitate to call the office at [phone].  Best,   Dana Allan, MD

## 2020-11-21 ENCOUNTER — Telehealth: Payer: Self-pay

## 2020-11-21 ENCOUNTER — Encounter: Payer: Self-pay | Admitting: Family Medicine

## 2020-11-21 DIAGNOSIS — M25531 Pain in right wrist: Secondary | ICD-10-CM | POA: Insufficient documentation

## 2020-11-21 NOTE — Progress Notes (Signed)
    SUBJECTIVE:   CHIEF COMPLAINT / HPI: wrist pain  Right wrist pain for 3 weeks.  Does not recall any trauma or injury. Pain starts at side of thumb and travels upward to mid forearm.  Intermittent numbness especially when waking up.  Nothing make pain better.  Hurts to close hand and grip things. Has been wearing fathers wrist brace which has helped a little.  PERTINENT  PMH / PSH:  TBI Intellectual disability Chronic neck pain OBJECTIVE:   BP 111/80   Pulse 90   Ht 5\' 2"  (1.575 m)   Wt 260 lb 3.2 oz (118 kg)   LMP 08/12/2020   SpO2 100%   BMI 47.59 kg/m    General: Alert, no acute distress Wrist:  Inspection: No evidence of erythema, ecchymosis, swelling, edema.  ROM: Intact ROM to wrist flexion/extension, ulnar/radial deviation w pain  Strength: 5/5 strength to resisted wrist flexion/extension, ulnar/radial deviation  Palpation:tenderness to palpation to distal radius when provider palpates, noted that patient was able to deeply palpate same area without pain, Positive Tinel's sign, negative Phalen's test Distal pulses present bilaterally and well perfused  ASSESSMENT/PLAN:   Wrist pain, right Unclear etiology.  Considered fracture, tendonitis, carpal tunnel -thumb spica splint -right wrist xray r/o scaphoid fracture although no history of known trauma so less likely -Follow up with results -Follow up with PCP if no improvement in symptoms, can consider SM referral     10/12/2020, MD [facility]

## 2020-11-21 NOTE — Assessment & Plan Note (Signed)
Unclear etiology.  Considered fracture, tendonitis, carpal tunnel -thumb spica splint -right wrist xray r/o scaphoid fracture although no history of known trauma so less likely -Follow up with results -Follow up with PCP if no improvement in symptoms, can consider SM referral

## 2020-11-21 NOTE — Telephone Encounter (Signed)
Patient calls nurse line requesting to speak with provider regarding results from transvaginal ultrasound.   Please advise. Forwarding to ordering provider.   Veronda Prude, RN

## 2020-11-22 ENCOUNTER — Encounter: Payer: Self-pay | Admitting: Family Medicine

## 2020-11-22 ENCOUNTER — Ambulatory Visit (HOSPITAL_COMMUNITY)
Admission: RE | Admit: 2020-11-22 | Discharge: 2020-11-22 | Disposition: A | Discharge status: Home / Self Care | Payer: Medicaid Other | Source: Ambulatory Visit | Attending: Family Medicine | Admitting: Family Medicine

## 2020-11-22 ENCOUNTER — Ambulatory Visit (INDEPENDENT_AMBULATORY_CARE_PROVIDER_SITE_OTHER): Payer: Medicaid Other | Admitting: Family Medicine

## 2020-11-22 ENCOUNTER — Other Ambulatory Visit: Payer: Self-pay

## 2020-11-22 VITALS — BP 121/80 | HR 84 | Temp 98.0°F | Ht 62.0 in | Wt 258.2 lb

## 2020-11-22 DIAGNOSIS — J4540 Moderate persistent asthma, uncomplicated: Secondary | ICD-10-CM

## 2020-11-22 DIAGNOSIS — S0990XA Unspecified injury of head, initial encounter: Secondary | ICD-10-CM | POA: Insufficient documentation

## 2020-11-22 DIAGNOSIS — M25531 Pain in right wrist: Secondary | ICD-10-CM | POA: Diagnosis not present

## 2020-11-22 DIAGNOSIS — T8332XA Displacement of intrauterine contraceptive device, initial encounter: Secondary | ICD-10-CM | POA: Diagnosis not present

## 2020-11-22 DIAGNOSIS — T7840XD Allergy, unspecified, subsequent encounter: Secondary | ICD-10-CM

## 2020-11-22 NOTE — Assessment & Plan Note (Addendum)
Patient's TVUS one week ago demonstrated IUD in the mid to lower uterine segments extending to cervix and failing to reach the fundal portion of the endometrial complex. Given positioning and desire for removal, will refer to OB/GYN since strings not seen on exam done here previously. Patient is amenable to switching contraception to Depo-Provera after removal of IUD with OB/GYN. She is at VERY high risk of unintentional pregnancy and I have encouraged her to remain on some form of birth control.

## 2020-11-22 NOTE — Assessment & Plan Note (Signed)
Patient's exam is worrisome for scaphoid fracture vs de Quervain's tenosynovitis. Given she was not able to get an x-ray after last visit, will reorder for further evaluation. Recommended continuing wrist splint and NSAIDs until x-ray results return. If no evidence of fracture and continued pain, will refer to sports medicine for further evaluation.
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
## 2020-11-22 NOTE — Patient Instructions (Addendum)
It was great to see you again today!  Referring to OBGYN to have your IUD removed and start depo that day  Refilled your medicines  Go get wrist xray Scheduling CT head  Be well, Dr. Pollie Meyer

## 2020-11-23 ENCOUNTER — Encounter (HOSPITAL_COMMUNITY): Payer: Self-pay | Admitting: Physician Assistant

## 2020-11-23 ENCOUNTER — Ambulatory Visit (INDEPENDENT_AMBULATORY_CARE_PROVIDER_SITE_OTHER): Payer: Medicaid Other | Admitting: Physician Assistant

## 2020-11-23 VITALS — BP 113/66 | HR 90 | Ht 62.0 in | Wt 257.0 lb

## 2020-11-23 DIAGNOSIS — F4310 Post-traumatic stress disorder, unspecified: Secondary | ICD-10-CM | POA: Diagnosis not present

## 2020-11-23 DIAGNOSIS — F39 Unspecified mood [affective] disorder: Secondary | ICD-10-CM | POA: Diagnosis not present

## 2020-11-23 DIAGNOSIS — F411 Generalized anxiety disorder: Secondary | ICD-10-CM

## 2020-11-23 MED ORDER — ARIPIPRAZOLE 10 MG PO TABS: 10.0000 mg | ORAL_TABLET | Freq: Every day | ORAL | 1 refills | Status: DC
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
## 2020-11-24 ENCOUNTER — Encounter (HOSPITAL_BASED_OUTPATIENT_CLINIC_OR_DEPARTMENT_OTHER): Payer: Self-pay

## 2020-11-24 ENCOUNTER — Emergency Department (HOSPITAL_BASED_OUTPATIENT_CLINIC_OR_DEPARTMENT_OTHER)
Admission: EM | Admit: 2020-11-24 | Discharge: 2020-11-24 | Disposition: A | Discharge status: Home / Self Care | Payer: Medicaid Other | Attending: Emergency Medicine | Admitting: Emergency Medicine

## 2020-11-24 ENCOUNTER — Other Ambulatory Visit: Payer: Self-pay

## 2020-11-24 DIAGNOSIS — Z5321 Procedure and treatment not carried out due to patient leaving prior to being seen by health care provider: Secondary | ICD-10-CM | POA: Diagnosis not present

## 2020-11-24 DIAGNOSIS — R102 Pelvic and perineal pain: Secondary | ICD-10-CM | POA: Insufficient documentation

## 2020-11-24 NOTE — ED Triage Notes (Signed)
Patient BIB [facility] from Home for Pelvic Pain.  IUD was found to not be in place approximately 3-4 days PTA. Patient had acute onset of Pelvic Pain began this PM approximately 1-2 Hours PTA. No Bleeding Noted.  Mother is Legal Guardian at Bedside. NAD Noted during Triage. BIB Stretcher/Wheelchair. History of TBI.

## 2020-11-24 NOTE — ED Notes (Signed)
Upon rounding on pt to update VS. Pt and caregiver noted exiting room. Caregiver states having to be at work early in the morning. Informed delay d/t acute emergency and MD to see pt after shift change. Caregiver/pt refused to stay.

## 2020-11-29 ENCOUNTER — Telehealth: Payer: Self-pay

## 2020-11-29 MED ORDER — ALBUTEROL SULFATE HFA 108 (90 BASE) MCG/ACT IN AERS: 2.0000 | INHALATION_SPRAY | Freq: Four times a day (QID) | RESPIRATORY_TRACT | 1 refills | Status: DC | PRN

## 2020-11-29 MED ORDER — EPINEPHRINE 0.3 MG/0.3ML IJ SOAJ: 0.3000 mg | Freq: Once | INTRAMUSCULAR | 1 refills | Status: DC | PRN

## 2020-11-29 MED ORDER — FLUTICASONE PROPIONATE 50 MCG/ACT NA SUSP: NASAL | 2 refills | Status: DC

## 2020-11-29 MED ORDER — FLUTICASONE PROPIONATE HFA 44 MCG/ACT IN AERO: 1.0000 | INHALATION_SPRAY | Freq: Two times a day (BID) | RESPIRATORY_TRACT | 2 refills | Status: DC

## 2020-11-29 NOTE — Telephone Encounter (Signed)
Patient calls nurse line reporting a sudden onset of bloody emesis. Patient reports "severe" abdominal pain associated with mild diarrhea. Patient reports symptoms started yesterday, however today is the only time she has vomited. Patient reports bright red blood. Patient denies fever or chills. Patient denies headaches, chest pain or SOB.   Given symptoms patient advised to go to UC.

## 2020-12-07 ENCOUNTER — Inpatient Hospital Stay: Payer: Medicaid Other | Admitting: Student

## 2020-12-07 NOTE — Progress Notes (Deleted)
    SUBJECTIVE:   CHIEF COMPLAINT / HPI:   ED Follow-up/Pelvic Pain Patient presented to our clinic on 10/27 and was seen by Dr. Nobie Putnam because she could not feel her IUD strings. IUD malpositioning was confirmed on TVUS on 11/08. Pt was then referred to OB/Gyn for removal but *** has not been to see them yet. She presented to the ED on 11/17 for acute onset pelvic pain but left prior to being assessed.   PERTINENT  PMH / PSH: ***  OBJECTIVE:   LMP 11/15/2020 (Approximate)   ***  ASSESSMENT/PLAN:   No problem-specific Assessment & Plan notes found for this encounter.     Dorothyann Gibbs, MD [facility]

## 2020-12-14 ENCOUNTER — Encounter: Payer: Medicaid Other | Admitting: Obstetrics and Gynecology

## 2020-12-14 ENCOUNTER — Inpatient Hospital Stay: Payer: Medicaid Other | Admitting: Student

## 2020-12-21 ENCOUNTER — Ambulatory Visit (HOSPITAL_COMMUNITY): Payer: Medicaid Other | Admitting: Licensed Clinical Social Worker

## 2021-01-04 ENCOUNTER — Encounter: Payer: Self-pay | Admitting: Student

## 2021-01-04 ENCOUNTER — Other Ambulatory Visit: Payer: Self-pay

## 2021-01-04 ENCOUNTER — Ambulatory Visit (INDEPENDENT_AMBULATORY_CARE_PROVIDER_SITE_OTHER): Payer: Medicaid Other | Admitting: Student

## 2021-01-04 ENCOUNTER — Ambulatory Visit (INDEPENDENT_AMBULATORY_CARE_PROVIDER_SITE_OTHER): Payer: Medicaid Other

## 2021-01-04 VITALS — BP 118/89 | HR 86 | Ht 62.0 in | Wt 254.5 lb

## 2021-01-04 DIAGNOSIS — M25531 Pain in right wrist: Secondary | ICD-10-CM

## 2021-01-04 DIAGNOSIS — Z23 Encounter for immunization: Secondary | ICD-10-CM

## 2021-01-04 DIAGNOSIS — T8332XA Displacement of intrauterine contraceptive device, initial encounter: Secondary | ICD-10-CM

## 2021-01-04 DIAGNOSIS — Z3200 Encounter for pregnancy test, result unknown: Secondary | ICD-10-CM

## 2021-01-04 LAB — POCT URINE PREGNANCY: Preg Test, Ur: NEGATIVE

## 2021-01-04 NOTE — Assessment & Plan Note (Signed)
Will get right wrist x-ray since she was unable to do so after last visit. Per last note, concern for scaphoid fracture vs. De quervain's tenosynovitis. Placed order for PT.

## 2021-01-04 NOTE — Patient Instructions (Addendum)
It was wonderful to see you today.  Please bring ALL of your medications with you to every visit.   Today we talked about:  Please call  [phone] about your IUD Please go to [facility] radiology for an X-ray of your wrist. I placed a referral for physical therapy for your wrist pain.  Thank you for choosing [facility].   Please call [phone] with any questions about today's appointment.  Please return to see Dr. Pollie Meyer  in March   Darral Dash, DO Family Medicine

## 2021-01-04 NOTE — Progress Notes (Signed)
° ° °  SUBJECTIVE:   CHIEF COMPLAINT / HPI:   IUD- desires [facility] but she says that she could not afford cost as it was 250$. Had a menstrual cycle 2 weeks ago that lasted 3 days. Has had irregular cycles since IUD placement. She is sexually active and sometimes uses condoms.  Right wrist pain She did not have imaging of wrist since last visit, although X-ray order was placed. She is having the same symptoms since visit 11/15 of "locking up" and pain despite NSAID use and wrist splinting x1 month.  PERTINENT  PMH / PSH: Intellectual disability, Asthma, Juvenile absence epilepsy  OBJECTIVE:   BP 118/89    Pulse 86    Ht 5\' 2"  (1.575 m)    Wt 254 lb 8 oz (115.4 kg)    LMP 12/21/2020    SpO2 99%    BMI 46.55 kg/m   General: Well-appearing, in no distress Resp: Breathing comfortably on room air MSK: Tenderness over base of right thumb and first metacarpal. Normal range of motion. No notable swelling or warmth.   ASSESSMENT/PLAN:   Wrist pain, right Will get right wrist x-ray since she was unable to do so after last visit. Per last note, concern for scaphoid fracture vs. De quervain's tenosynovitis. Placed order for PT.  IUD threads lost Urine pregnancy test negative today. Placed referral to [facility] for removal, since strings not visualized at last office visit here and TVUS showed malpositioning. Encouraged condom use to prevent STI. Strongly encouraged contraceptive use after removal. Pamphlet given with contraceptive choices.     [facility], DO [facility]

## 2021-01-04 NOTE — Assessment & Plan Note (Addendum)
Urine pregnancy test negative today. Placed referral to [facility] for removal, since strings not visualized at last office visit here and TVUS showed malpositioning. Encouraged condom use to prevent STI. Strongly encouraged contraceptive use after removal. Pamphlet given with contraceptive choices.

## 2021-01-12 ENCOUNTER — Encounter (HOSPITAL_COMMUNITY): Payer: Medicaid Other | Admitting: Physician Assistant

## 2021-01-17 ENCOUNTER — Ambulatory Visit: Payer: Self-pay | Admitting: Family Medicine

## 2021-01-26 ENCOUNTER — Other Ambulatory Visit: Payer: Self-pay

## 2021-01-26 ENCOUNTER — Ambulatory Visit (INDEPENDENT_AMBULATORY_CARE_PROVIDER_SITE_OTHER): Payer: Medicaid Other | Admitting: Obstetrics and Gynecology

## 2021-01-26 ENCOUNTER — Encounter: Payer: Self-pay | Admitting: Obstetrics and Gynecology

## 2021-01-26 VITALS — BP 112/77 | HR 90 | Ht 62.0 in | Wt 253.5 lb

## 2021-01-26 DIAGNOSIS — Z3009 Encounter for other general counseling and advice on contraception: Secondary | ICD-10-CM | POA: Diagnosis not present

## 2021-01-26 DIAGNOSIS — Z30432 Encounter for removal of intrauterine contraceptive device: Secondary | ICD-10-CM | POA: Diagnosis not present

## 2021-01-26 MED ORDER — NORETHIN-ETH ESTRAD-FE BIPHAS 1 MG-10 MCG / 10 MCG PO TABS: 1.0000 | ORAL_TABLET | Freq: Every day | ORAL | 11 refills | Status: DC

## 2021-01-26 NOTE — Progress Notes (Signed)
Patient presents as New Patient for IUD removal. Patient does not desire STD testing. Patient states that it is time for IUD to be removed. She would like to start depo OCP.  Last Pap: 03/16/20 Normal

## 2021-01-26 NOTE — Progress Notes (Signed)
GYNECOLOGY CLINIC PROCEDURE NOTE  Lisa [name] is a 26 y.o. G0P0000 here for IUD removal. No GYN concerns.  Last pap smear was on 03/16/20 and was normal.  IUD Removal  Patient identified, informed consent performed, consent signed.  Patient was in the dorsal lithotomy position, normal external genitalia was noted.  A speculum was placed in the patient's vagina, normal discharge was noted, no lesions. The cervix was visualized, no lesions, no abnormal discharge.  The strings of the IUD were grasped and pulled using ring forceps. The IUD was removed in its entirety. Patient tolerated the procedure well.    Patient will use birth control pills for contraception.  Routine preventative health maintenance measures emphasized.

## 2021-01-30 ENCOUNTER — Telehealth: Payer: Self-pay | Admitting: Family Medicine

## 2021-01-30 ENCOUNTER — Ambulatory Visit (INDEPENDENT_AMBULATORY_CARE_PROVIDER_SITE_OTHER): Payer: Medicaid Other | Admitting: Family Medicine

## 2021-01-30 ENCOUNTER — Other Ambulatory Visit: Payer: Self-pay

## 2021-01-30 VITALS — HR 93 | Wt 250.3 lb

## 2021-01-30 DIAGNOSIS — R45851 Suicidal ideations: Secondary | ICD-10-CM

## 2021-01-30 NOTE — Telephone Encounter (Signed)
See previous message.  Dr. Edwina Barth has taken care of situation.  [name], [place]
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
## 2021-01-30 NOTE — Patient Instructions (Signed)
[facility] [address]--[address]   Thank you for coming to this visit today  Dr. Atha Starks
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
## 2021-01-30 NOTE — Progress Notes (Deleted)
° ° °  SUBJECTIVE:   CHIEF COMPLAINT / HPI:   Encounter for plasma screening: 26 year old female that presents for physical exam and blood work prior to donating plasma.  She has no concerns today***.  PERTINENT  PMH / PSH: ***  OBJECTIVE:   There were no vitals taken for this visit. ***  General: NAD, pleasant, able to participate in exam Cardiac: RRR, no murmurs. Respiratory: CTAB, normal effort, No wheezes, rales or rhonchi Abdomen: Bowel sounds present, nontender, nondistended, no hepatosplenomegaly. Extremities: no edema or cyanosis. Skin: warm and dry, no rashes noted Neuro: alert, no obvious focal deficits Psych: Normal affect and mood  ASSESSMENT/PLAN:   No problem-specific Assessment & Plan notes found for this encounter.   Per recommendations we will screen for hepatitis C, HIV.  We will check a CMP to evaluate her liver enzymes as well as protein level.  We will also check CBC to evaluate blood counts.  Lisa [name], [place]    {    This will disappear when note is signed, click to select method of visit    :1}
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
## 2021-01-31 ENCOUNTER — Telehealth: Payer: Self-pay | Admitting: Psychology

## 2021-01-31 NOTE — Telephone Encounter (Signed)
Spoke with mom. Reported mobile crisis was on their way for pt.  Royetta Asal, PhD., LMFT

## 2021-01-31 NOTE — Telephone Encounter (Signed)
Spoke with mom this AM. She was leaving to bring pt to [facility] Discussed calling mobile crisis if she doesn't want to go.   Royetta Asal, PhD., LMFT

## 2021-02-09 ENCOUNTER — Telehealth: Payer: Self-pay | Admitting: Family Medicine

## 2021-02-09 NOTE — Telephone Encounter (Signed)
Reviewed form and placed in provider's box for completion. ° °.Walt Geathers R Shataya Winkles, CMA ° °

## 2021-02-09 NOTE — Telephone Encounter (Signed)
Patient dropped off form at front desk for [employer] for Healthcare Provider.  Verified that patient section of form has been completed.  Last DOS/WCC with PCP was 01/30/21.  Placed form in red team folder to be completed by clinical staff.  Lisa [name]

## 2021-02-10 NOTE — Telephone Encounter (Signed)
Called patient.  States she is not taking abilify, zoloft or lamictal.   States no seizure for five years.  Does take her birth contol pills. Form completed and placed in RN box.

## 2021-02-10 NOTE — Telephone Encounter (Signed)
Form placed up front for pick up and a copy was made for batch scanning.  ° °The patient has been made aware.  °

## 2021-02-11 ENCOUNTER — Encounter: Payer: Self-pay | Admitting: Family Medicine

## 2021-02-13 ENCOUNTER — Ambulatory Visit: Payer: Medicaid Other | Admitting: Family Medicine

## 2021-02-15 ENCOUNTER — Ambulatory Visit: Payer: Medicaid Other | Admitting: Family Medicine

## 2021-03-02 ENCOUNTER — Ambulatory Visit (HOSPITAL_COMMUNITY)
Admission: EM | Admit: 2021-03-02 | Discharge: 2021-03-02 | Disposition: A | Discharge status: Home / Self Care | Payer: Medicaid Other | Attending: Family Medicine | Admitting: Family Medicine

## 2021-03-02 ENCOUNTER — Other Ambulatory Visit: Payer: Self-pay

## 2021-03-02 ENCOUNTER — Encounter (HOSPITAL_COMMUNITY): Payer: Self-pay | Admitting: Emergency Medicine

## 2021-03-02 DIAGNOSIS — R1013 Epigastric pain: Secondary | ICD-10-CM | POA: Diagnosis not present

## 2021-03-02 DIAGNOSIS — G8929 Other chronic pain: Secondary | ICD-10-CM

## 2021-03-02 DIAGNOSIS — N644 Mastodynia: Secondary | ICD-10-CM

## 2021-03-02 DIAGNOSIS — Z3201 Encounter for pregnancy test, result positive: Secondary | ICD-10-CM | POA: Diagnosis not present

## 2021-03-02 LAB — POCT URINALYSIS DIPSTICK, ED / UC
Bilirubin Urine: NEGATIVE
Glucose, UA: NEGATIVE mg/dL
Hgb urine dipstick: NEGATIVE
Leukocytes,Ua: NEGATIVE
Nitrite: NEGATIVE
Protein, ur: 30 mg/dL — AB
Specific Gravity, Urine: 1.025 (ref 1.005–1.030)
Urobilinogen, UA: 4 mg/dL — ABNORMAL HIGH (ref 0.0–1.0)
pH: 6 (ref 5.0–8.0)

## 2021-03-02 LAB — POC URINE PREG, ED: Preg Test, Ur: POSITIVE — AB

## 2021-03-02 MED ORDER — OMEPRAZOLE 20 MG PO CPDR: 20.0000 mg | DELAYED_RELEASE_CAPSULE | Freq: Every day | ORAL | 0 refills | Status: DC

## 2021-03-02 NOTE — ED Triage Notes (Signed)
lower back pain and left breast is tender.  LMP 01/21/2021.  Patient is not any birth control and reports she is never late
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
## 2021-03-02 NOTE — Discharge Instructions (Addendum)
Your pregnancy test is positive.  The urinalysis did not show signs of infection.  For your stomach, you can take omeprazole 20 mg 1 daily  Make an appointment for ob doctor.

## 2021-03-05 ENCOUNTER — Inpatient Hospital Stay (HOSPITAL_COMMUNITY): Payer: Medicaid Other

## 2021-03-05 ENCOUNTER — Inpatient Hospital Stay (HOSPITAL_COMMUNITY)
Admission: AD | Admit: 2021-03-05 | Discharge: 2021-03-05 | Disposition: A | Discharge status: Home / Self Care | Payer: Medicaid Other | Attending: Obstetrics & Gynecology | Admitting: Obstetrics & Gynecology

## 2021-03-05 ENCOUNTER — Encounter (HOSPITAL_COMMUNITY): Payer: Self-pay | Admitting: Obstetrics & Gynecology

## 2021-03-05 ENCOUNTER — Other Ambulatory Visit: Payer: Self-pay

## 2021-03-05 DIAGNOSIS — O3680X0 Pregnancy with inconclusive fetal viability, not applicable or unspecified: Secondary | ICD-10-CM | POA: Diagnosis not present

## 2021-03-05 DIAGNOSIS — Z3A01 Less than 8 weeks gestation of pregnancy: Secondary | ICD-10-CM | POA: Diagnosis not present

## 2021-03-05 DIAGNOSIS — R109 Unspecified abdominal pain: Secondary | ICD-10-CM | POA: Diagnosis not present

## 2021-03-05 DIAGNOSIS — Z331 Pregnant state, incidental: Secondary | ICD-10-CM

## 2021-03-05 DIAGNOSIS — O26891 Other specified pregnancy related conditions, first trimester: Secondary | ICD-10-CM | POA: Insufficient documentation

## 2021-03-05 DIAGNOSIS — O26899 Other specified pregnancy related conditions, unspecified trimester: Secondary | ICD-10-CM

## 2021-03-05 LAB — ABO/RH: ABO/RH(D): B POS

## 2021-03-05 LAB — CBC WITH DIFFERENTIAL/PLATELET
Abs Immature Granulocytes: 0.01 10*3/uL (ref 0.00–0.07)
Basophils Absolute: 0 10*3/uL (ref 0.0–0.1)
Basophils Relative: 0 %
Eosinophils Absolute: 0 10*3/uL (ref 0.0–0.5)
Eosinophils Relative: 1 %
HCT: 37.1 % (ref 36.0–46.0)
Hemoglobin: 12.2 g/dL (ref 12.0–15.0)
Immature Granulocytes: 0 %
Lymphocytes Relative: 40 %
Lymphs Abs: 3.3 10*3/uL (ref 0.7–4.0)
MCH: 26.9 pg (ref 26.0–34.0)
MCHC: 32.9 g/dL (ref 30.0–36.0)
MCV: 81.7 fL (ref 80.0–100.0)
Monocytes Absolute: 0.6 10*3/uL (ref 0.1–1.0)
Monocytes Relative: 7 %
Neutro Abs: 4.3 10*3/uL (ref 1.7–7.7)
Neutrophils Relative %: 52 %
Platelets: 278 10*3/uL (ref 150–400)
RBC: 4.54 MIL/uL (ref 3.87–5.11)
RDW: 13.9 % (ref 11.5–15.5)
WBC: 8.3 10*3/uL (ref 4.0–10.5)
nRBC: 0 % (ref 0.0–0.2)

## 2021-03-05 LAB — URINALYSIS, ROUTINE W REFLEX MICROSCOPIC
Bilirubin Urine: NEGATIVE
Glucose, UA: NEGATIVE mg/dL
Hgb urine dipstick: NEGATIVE
Ketones, ur: NEGATIVE mg/dL
Nitrite: NEGATIVE
Protein, ur: NEGATIVE mg/dL
Specific Gravity, Urine: 1.026 (ref 1.005–1.030)
Squamous Epithelial / HPF: >50 — ABNORMAL HIGH (ref 0–5)
pH: 6 (ref 5.0–8.0)

## 2021-03-05 LAB — WET PREP, GENITAL
Clue Cells Wet Prep HPF POC: NONE SEEN
Sperm: NONE SEEN
Trich, Wet Prep: NONE SEEN
WBC, Wet Prep HPF POC: <10 (ref <10)
Yeast Wet Prep HPF POC: NONE SEEN

## 2021-03-05 LAB — HCG, QUANTITATIVE, PREGNANCY: hCG, Beta Chain, Quant, S: 976 m[IU]/mL — ABNORMAL HIGH (ref <5)

## 2021-03-05 NOTE — MAU Note (Signed)
Presents with c/o abdominal cramping that began 2 days ago.  Denies VB.  Had +UPT @ Urgent Care.  LMP 01/24/2021.
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
## 2021-03-06 LAB — GC/CHLAMYDIA PROBE AMP (~~LOC~~) NOT AT ARMC
Chlamydia: NEGATIVE
Comment: NEGATIVE
Comment: NORMAL
Neisseria Gonorrhea: NEGATIVE

## 2021-03-07 ENCOUNTER — Other Ambulatory Visit: Payer: Medicaid Other

## 2021-03-08 ENCOUNTER — Telehealth: Payer: Self-pay

## 2021-03-08 ENCOUNTER — Ambulatory Visit: Payer: Medicaid Other

## 2021-03-09 ENCOUNTER — Inpatient Hospital Stay (HOSPITAL_COMMUNITY)
Admission: AD | Admit: 2021-03-09 | Discharge: 2021-03-10 | Disposition: A | Discharge status: Home / Self Care | Payer: Medicaid Other | Attending: Obstetrics & Gynecology | Admitting: Obstetrics & Gynecology

## 2021-03-09 ENCOUNTER — Other Ambulatory Visit: Payer: Self-pay

## 2021-03-09 ENCOUNTER — Encounter: Payer: Medicaid Other | Admitting: Student

## 2021-03-09 ENCOUNTER — Encounter: Payer: Medicaid Other | Admitting: Family Medicine

## 2021-03-09 DIAGNOSIS — R1030 Lower abdominal pain, unspecified: Secondary | ICD-10-CM | POA: Insufficient documentation

## 2021-03-09 DIAGNOSIS — O26899 Other specified pregnancy related conditions, unspecified trimester: Secondary | ICD-10-CM

## 2021-03-09 DIAGNOSIS — Z3A01 Less than 8 weeks gestation of pregnancy: Secondary | ICD-10-CM

## 2021-03-09 DIAGNOSIS — O26891 Other specified pregnancy related conditions, first trimester: Secondary | ICD-10-CM | POA: Insufficient documentation

## 2021-03-10 ENCOUNTER — Inpatient Hospital Stay (HOSPITAL_COMMUNITY): Payer: Medicaid Other

## 2021-03-10 ENCOUNTER — Encounter (HOSPITAL_COMMUNITY): Payer: Self-pay | Admitting: Obstetrics & Gynecology

## 2021-03-10 DIAGNOSIS — R1030 Lower abdominal pain, unspecified: Secondary | ICD-10-CM | POA: Diagnosis not present

## 2021-03-10 DIAGNOSIS — Z3A01 Less than 8 weeks gestation of pregnancy: Secondary | ICD-10-CM

## 2021-03-10 DIAGNOSIS — O26899 Other specified pregnancy related conditions, unspecified trimester: Secondary | ICD-10-CM | POA: Diagnosis not present

## 2021-03-10 DIAGNOSIS — R109 Unspecified abdominal pain: Secondary | ICD-10-CM

## 2021-03-10 DIAGNOSIS — O26891 Other specified pregnancy related conditions, first trimester: Secondary | ICD-10-CM | POA: Diagnosis present

## 2021-03-10 LAB — URINALYSIS, ROUTINE W REFLEX MICROSCOPIC
Bilirubin Urine: NEGATIVE
Glucose, UA: NEGATIVE mg/dL
Hgb urine dipstick: NEGATIVE
Ketones, ur: 5 mg/dL — AB
Nitrite: NEGATIVE
Protein, ur: 30 mg/dL — AB
RBC / HPF: >50 RBC/hpf — ABNORMAL HIGH (ref 0–5)
Specific Gravity, Urine: 1.026 (ref 1.005–1.030)
WBC, UA: >50 WBC/hpf — ABNORMAL HIGH (ref 0–5)
pH: 6 (ref 5.0–8.0)

## 2021-03-10 NOTE — Progress Notes (Signed)
[place] and verbal d/c instructions given and understanding voiced. Pt's mother, Jolee Ewing, is pt's legal guardian. Pt called her mother while RN in the room to notifiy her of her d/c home. Pt's mother provided funds for pt to [place] home ?
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
## 2021-03-10 NOTE — MAU Note (Addendum)
I found out I was pregnant on 2/23.Tonight I have been having some lower abd pain and bright bleeding. No recent intercourse. Cramping comes and goes. Twin gestation per patient ?

## 2021-03-10 NOTE — Discharge Instructions (Signed)
Return to care  If you have heavier bleeding that soaks through more than 2 pads per hour for an hour or more If you bleed so much that you feel like you might pass out or you do pass out If you have significant abdominal pain that is not improved with Tylenol   

## 2021-03-11 LAB — CULTURE, OB URINE
Culture: >=100000 — AB
Special Requests: NORMAL

## 2021-03-15 ENCOUNTER — Ambulatory Visit: Payer: Medicaid Other

## 2021-03-16 ENCOUNTER — Ambulatory Visit: Payer: Medicaid Other

## 2021-03-17 ENCOUNTER — Other Ambulatory Visit: Payer: Self-pay

## 2021-03-17 ENCOUNTER — Ambulatory Visit (INDEPENDENT_AMBULATORY_CARE_PROVIDER_SITE_OTHER): Payer: Medicaid Other | Admitting: Family Medicine

## 2021-03-17 VITALS — BP 113/61 | HR 92 | Wt 251.8 lb

## 2021-03-17 DIAGNOSIS — Z3401 Encounter for supervision of normal first pregnancy, first trimester: Secondary | ICD-10-CM

## 2021-03-17 DIAGNOSIS — Z3400 Encounter for supervision of normal first pregnancy, unspecified trimester: Secondary | ICD-10-CM | POA: Insufficient documentation

## 2021-03-17 NOTE — Patient Instructions (Addendum)
It was great seeing you today! ? ?Today we had your initial OB appointment, I am glad things are going well thus far!  ? ?We will also get blood work today, I will let you know of any abnormal results.  ? ?Please take a prenatal vitamin daily.  ? ?Go to the MAU at [facility] at [facility] if: ?You have pain in your lower abdomen or pelvic area ?Your water breaks.  Sometimes it is a big gush of fluid, sometimes it is just a trickle that keeps getting your underwear wet or running down your legs ?You have vaginal bleeding.  ? ?Please follow up at your next scheduled appointment on 3/30 at 8:30 am with Dr. Pollie Meyer, if anything arises between now and then, please don't hesitate to contact our office. Please be prepared to go a sugar test and a PAP smear, please try not to eat anything at least an hour before coming in.  ? ? ?Thank you for allowing [place] to be a part of your medical care! ? ?Thank you, ?Dr. Robyne Peers  ?

## 2021-03-17 NOTE — Progress Notes (Signed)
?Patient Name: Lisa [name] ?Date of Birth: 1995/12/31 ?[facility] Initial Prenatal Visit ? ?Lisa [name] is a 26 y.o. year old G1P0000 at [redacted]w[redacted]d who presents for her initial prenatal visit. She presents with both her mother and best friend.  ?Pregnancy is not planned ?She reports breast tenderness. ?She is not taking a prenatal vitamin.  ?She denies pelvic pain or vaginal bleeding.  ? ?Pregnancy Dating: ?The patient is dated by LMP. Upcoming ultrasound 3/13.  ?LMP: 01/24/2021 ?Period is certain:  Yes.  ?Periods were regular:  Yes.  ?LMP was a typical period:  Yes.  ?Using hormonal contraception in 3 months prior to conception: Yes, IUD removed per patient preference prior to pregnancy. Placed on OCPs but shares that she did not pick them up.  ? ?Lab Review: ?Blood type: B POS ?Rh Status: positive ?Awaiting initial OB blood work  ? 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 Last seizure about a month ago which was a staring spell (pseudoseizure). Prior to this, she did not have any seizures for years. Sees neurologist in [place], mother is unsure of doctor's name.  ?VTE: No ,  ?History of STI Yes,  ?Abnormal Pap smear:  Yes, LSIL on prior PAP otherwise normal.  ?Genital herpes simplex:  No  ? ?PSH: ?Gynecologic Surgery:  no ?Surgical history reviewed, notable for: eustachian tube  ? ?Obstetric History: ?Obstetric history tab updated and reviewed.  ?Summary of prior pregnancies: This is the first pregnancy.  ?Cesarean delivery: No  ?Gestational Diabetes:  No ?Hypertension in pregnancy: No ?History of preterm birth: No ?History of LGA/SGA infant:  No ?History of shoulder dystocia: No ?Indications for referral were reviewed, and the patient has no obstetric indications for referral to High Risk OB Clinic at this time.   ? ?Social History: ?Partner's name: Leonides Grills   ?Tobacco use: No ?Alcohol use:  No ?Other substance use:  No ? ?Current Medications:  ?Flonase and albuterol   ?Reviewed and appropriate in pregnancy.  ? ?Genetic and Infection Screen: ?Flow Sheet Updated Yes ? ?Prenatal Exam: ?Gen: Well nourished, well developed.  No distress.  Vitals noted. ?HEENT: Normocephalic, atraumatic.  Neck supple without cervical lymphadenopathy, thyromegaly or thyroid nodules.  Fair dentition. ?CV: RRR no murmur, gallops or rubs ?Lungs: CTAB.  Normal respiratory effort without wheezes, rales or rhonchi. ?Abd: soft, NTND. +BS.  Uterus not appreciated above pelvis. ?Ext: No clubbing, cyanosis or edema. ?Psych: Normal grooming and dress.  Not depressed or anxious appearing.  Normal thought content and process without flight of ideas or looseness of associations ? ? ?Assessment/Plan: ? ?SUZI HERNAN is a 26 y.o. G1P0000 at [redacted]w[redacted]d who presents to initiate prenatal care. She is doing well.  ?Current pregnancy issues include asthma and seizure disorder. ? ?Routine prenatal care: ?As dating is not reliable, a dating ultrasound has been ordered. Dating tab updated. ?Pre-pregnancy weight updated. Expected weight gain this pregnancy is 11-20 lbs. ?Prenatal labs obtained today, awaiting results.  ?Indications for referral to HROB were reviewed and the patient does not meet criteria for referral at this time.  ?Medication list reviewed and updated.  ?Recommended patient see a dentist for regular care.  ?Bleeding and pain precautions reviewed. ?Importance of prenatal vitamins reviewed.  ?Genetic screening was not offered as mother was not present for discussion as she had  to leave early for another appointment. Patient will likely need to be referred to genetics at next visit.  ?The patient does have an indication for aspirin therapy beginning at 12-16 weeks. Aspirin was  recommended today. Plan to start aspirin at 12 weeks.  ?The patient will  not age 49 or over at time of delivery. Referral to genetic counseling was not offered today.  ?The patient has risk factors for preexisting diabetes given elevated BMI: An early 1 hour glucose tolerance test was not ordered given patient had just eaten. Please perform 1 hr glucola at next visit to screen for preexisting diabetes.  ?Pregnancy Medical Home and PHQ-9 forms completed, problems noted: Yes ? ?2. Pregnancy issues include the following which were addressed today:  ?Asthma: well-controlled on current regimen and asymptomatic. ?Seizure disorder: not on any anti-epileptic medications.  ?PHQ-9 score of 7 with negative question 9 reviewed.  ?Instructed to take prenatal vitamin daily.  ? ? ?Follow up on 3/30 for next prenatal visit. Please perform PAP smear and 1 hr glucola at this time. Please discuss genetics referral given family and personal history.  ? ? ?

## 2021-03-20 ENCOUNTER — Other Ambulatory Visit (HOSPITAL_COMMUNITY): Payer: Self-pay

## 2021-03-20 ENCOUNTER — Ambulatory Visit
Admission: RE | Admit: 2021-03-20 | Discharge: 2021-03-20 | Disposition: A | Discharge status: Home / Self Care | Payer: Medicaid Other | Source: Ambulatory Visit

## 2021-03-20 ENCOUNTER — Other Ambulatory Visit: Payer: Self-pay

## 2021-03-20 DIAGNOSIS — N8311 Corpus luteum cyst of right ovary: Secondary | ICD-10-CM | POA: Diagnosis not present

## 2021-03-20 DIAGNOSIS — O3481 Maternal care for other abnormalities of pelvic organs, first trimester: Secondary | ICD-10-CM | POA: Diagnosis not present

## 2021-03-20 DIAGNOSIS — Z3A01 Less than 8 weeks gestation of pregnancy: Secondary | ICD-10-CM | POA: Insufficient documentation

## 2021-03-22 ENCOUNTER — Encounter: Payer: Self-pay | Admitting: Family Medicine

## 2021-03-22 LAB — CBC/D/PLT+RPR+RH+ABO+RUBIGG...
Antibody Screen: NEGATIVE
Basophils Absolute: 0 10*3/uL (ref 0.0–0.2)
Basos: 0 %
Bilirubin, UA: NEGATIVE
EOS (ABSOLUTE): 0 10*3/uL (ref 0.0–0.4)
Eos: 1 %
Glucose, UA: NEGATIVE
HCV Ab: NONREACTIVE
HIV Screen 4th Generation wRfx: NONREACTIVE
Hematocrit: 36.1 % (ref 34.0–46.6)
Hemoglobin: 12.2 g/dL (ref 11.1–15.9)
Hepatitis B Surface Ag: NEGATIVE
Immature Grans (Abs): 0 10*3/uL (ref 0.0–0.1)
Immature Granulocytes: 0 %
Ketones, UA: NEGATIVE
Lymphocytes Absolute: 2.2 10*3/uL (ref 0.7–3.1)
Lymphs: 35 %
MCH: 27.1 pg (ref 26.6–33.0)
MCHC: 33.8 g/dL (ref 31.5–35.7)
MCV: 80 fL (ref 79–97)
Monocytes Absolute: 0.5 10*3/uL (ref 0.1–0.9)
Monocytes: 8 %
Neutrophils Absolute: 3.6 10*3/uL (ref 1.4–7.0)
Neutrophils: 56 %
Nitrite, UA: NEGATIVE
Platelets: 257 10*3/uL (ref 150–450)
RBC, UA: NEGATIVE
RBC: 4.51 x10E6/uL (ref 3.77–5.28)
RDW: 13.3 % (ref 11.7–15.4)
RPR Ser Ql: NONREACTIVE
Rh Factor: POSITIVE
Rubella Antibodies, IGG: 4.51 index (ref >0.99)
Specific Gravity, UA: 1.016 (ref 1.005–1.030)
Urobilinogen, Ur: 1 mg/dL (ref 0.2–1.0)
WBC: 6.3 10*3/uL (ref 3.4–10.8)
pH, UA: 7 (ref 5.0–7.5)

## 2021-03-22 LAB — MICROSCOPIC EXAMINATION
Casts: NONE SEEN /lpf
Epithelial Cells (non renal): >10 /hpf — AB (ref 0–10)

## 2021-03-22 LAB — HGB FRACTIONATION CASCADE
Hgb A2: 2.9 % (ref 1.8–3.2)
Hgb A: 97.1 % (ref 96.4–98.8)
Hgb F: 0 % (ref 0.0–2.0)
Hgb S: 0 %

## 2021-03-22 LAB — HCV INTERPRETATION

## 2021-03-22 LAB — URINE CULTURE, OB REFLEX

## 2021-03-30 ENCOUNTER — Encounter: Payer: Medicaid Other | Admitting: Family Medicine

## 2021-03-30 NOTE — Progress Notes (Deleted)
? ? ?  SUBJECTIVE:  ? ?CHIEF COMPLAINT / HPI:  ? ?*** ? ?PERTINENT  PMH / PSH: *** ? ?OBJECTIVE:  ? ?LMP 01/24/2021   ?*** ? ?ASSESSMENT/PLAN:  ? ?No problem-specific Assessment & Plan notes found for this encounter. ?  ? ? ?Lydie Stammen Autry-Lott, DO ?[place]  ?

## 2021-03-30 NOTE — Patient Instructions (Incomplete)
It was wonderful to see you today. ? ?Please bring ALL of your medications with you to every visit.  ? ?Today we talked about: ? ?** ? ?Please be sure to schedule follow up at the front  desk before you leave today.  ? ?If you haven't already, sign up for My Chart to have easy access to your labs results, and communication with your primary care physician. ? ?Please call the clinic at [phone] if your symptoms worsen or you have any concerns. It was our pleasure to serve you. ? ?Dr. Autry-Lott ? ?

## 2021-04-01 ENCOUNTER — Other Ambulatory Visit: Payer: Self-pay

## 2021-04-01 ENCOUNTER — Encounter (HOSPITAL_COMMUNITY): Payer: Self-pay

## 2021-04-01 ENCOUNTER — Emergency Department (HOSPITAL_COMMUNITY)
Admission: EM | Admit: 2021-04-01 | Discharge: 2021-04-01 | Disposition: A | Discharge status: Home / Self Care | Payer: Medicaid Other | Attending: Emergency Medicine | Admitting: Emergency Medicine

## 2021-04-01 DIAGNOSIS — O26891 Other specified pregnancy related conditions, first trimester: Secondary | ICD-10-CM | POA: Diagnosis not present

## 2021-04-01 DIAGNOSIS — Z3A08 8 weeks gestation of pregnancy: Secondary | ICD-10-CM | POA: Diagnosis not present

## 2021-04-01 DIAGNOSIS — R55 Syncope and collapse: Secondary | ICD-10-CM | POA: Insufficient documentation

## 2021-04-01 DIAGNOSIS — R079 Chest pain, unspecified: Secondary | ICD-10-CM | POA: Diagnosis not present

## 2021-04-01 DIAGNOSIS — R109 Unspecified abdominal pain: Secondary | ICD-10-CM | POA: Diagnosis not present

## 2021-04-01 LAB — COMPREHENSIVE METABOLIC PANEL
ALT: 13 U/L (ref 0–44)
AST: 12 U/L — ABNORMAL LOW (ref 15–41)
Albumin: 3.8 g/dL (ref 3.5–5.0)
Alkaline Phosphatase: 69 U/L (ref 38–126)
Anion gap: 8 (ref 5–15)
BUN: 7 mg/dL (ref 6–20)
CO2: 22 mmol/L (ref 22–32)
Calcium: 9.1 mg/dL (ref 8.9–10.3)
Chloride: 103 mmol/L (ref 98–111)
Creatinine, Ser: 0.68 mg/dL (ref 0.44–1.00)
GFR, Estimated: >60 mL/min (ref >60)
Glucose, Bld: 73 mg/dL (ref 70–99)
Potassium: 3.7 mmol/L (ref 3.5–5.1)
Sodium: 133 mmol/L — ABNORMAL LOW (ref 135–145)
Total Bilirubin: 0.3 mg/dL (ref 0.3–1.2)
Total Protein: 7.9 g/dL (ref 6.5–8.1)

## 2021-04-01 LAB — CBC WITH DIFFERENTIAL/PLATELET
Abs Immature Granulocytes: 0.01 10*3/uL (ref 0.00–0.07)
Basophils Absolute: 0 10*3/uL (ref 0.0–0.1)
Basophils Relative: 0 %
Eosinophils Absolute: 0 10*3/uL (ref 0.0–0.5)
Eosinophils Relative: 1 %
HCT: 39.9 % (ref 36.0–46.0)
Hemoglobin: 13.3 g/dL (ref 12.0–15.0)
Immature Granulocytes: 0 %
Lymphocytes Relative: 38 %
Lymphs Abs: 3.1 10*3/uL (ref 0.7–4.0)
MCH: 27 pg (ref 26.0–34.0)
MCHC: 33.3 g/dL (ref 30.0–36.0)
MCV: 81.1 fL (ref 80.0–100.0)
Monocytes Absolute: 0.6 10*3/uL (ref 0.1–1.0)
Monocytes Relative: 8 %
Neutro Abs: 4.4 10*3/uL (ref 1.7–7.7)
Neutrophils Relative %: 53 %
Platelets: 242 10*3/uL (ref 150–400)
RBC: 4.92 MIL/uL (ref 3.87–5.11)
RDW: 13.4 % (ref 11.5–15.5)
WBC: 8.2 10*3/uL (ref 4.0–10.5)
nRBC: 0 % (ref 0.0–0.2)

## 2021-04-01 LAB — URINALYSIS, ROUTINE W REFLEX MICROSCOPIC
Bilirubin Urine: NEGATIVE
Glucose, UA: NEGATIVE mg/dL
Ketones, ur: NEGATIVE mg/dL
Nitrite: NEGATIVE
Protein, ur: NEGATIVE mg/dL
Specific Gravity, Urine: 1.02 (ref 1.005–1.030)
pH: 6 (ref 5.0–8.0)

## 2021-04-01 LAB — URINALYSIS, MICROSCOPIC (REFLEX)

## 2021-04-01 LAB — PREGNANCY, URINE: Preg Test, Ur: POSITIVE — AB

## 2021-04-01 NOTE — ED Notes (Signed)
Pt is aware we need a urine sample. 

## 2021-04-01 NOTE — ED Notes (Signed)
Unsuccessful IV attempt x2. Charge RN asked to attempt.  
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
## 2021-04-01 NOTE — ED Notes (Signed)
[facility] came to pick her up (he is listed in her chart). Patient was walked out to his car at [employer].  ?

## 2021-04-01 NOTE — ED Triage Notes (Signed)
Per EMS, Pt, from home, c/o LOC earlier and intermittent upper abdominal pain and intermittent central chest pain x 1.5 days. Pain score 8/10.  Pt reports she has not been eating well d/t nausea, but has been drinking a lot.  Pt is [redacted] weeks pregnant.  ?

## 2021-04-06 ENCOUNTER — Ambulatory Visit
Admission: RE | Admit: 2021-04-06 | Discharge: 2021-04-06 | Disposition: A | Discharge status: Home / Self Care | Payer: Medicaid Other | Source: Ambulatory Visit | Attending: Family Medicine | Admitting: Family Medicine

## 2021-04-06 ENCOUNTER — Other Ambulatory Visit: Payer: Self-pay | Admitting: Family Medicine

## 2021-04-06 ENCOUNTER — Other Ambulatory Visit (HOSPITAL_COMMUNITY)
Admission: RE | Admit: 2021-04-06 | Discharge: 2021-04-06 | Disposition: A | Discharge status: Home / Self Care | Payer: Medicaid Other | Source: Ambulatory Visit | Attending: Family Medicine | Admitting: Family Medicine

## 2021-04-06 ENCOUNTER — Ambulatory Visit (INDEPENDENT_AMBULATORY_CARE_PROVIDER_SITE_OTHER): Payer: Medicaid Other | Admitting: Family Medicine

## 2021-04-06 ENCOUNTER — Other Ambulatory Visit: Payer: Self-pay

## 2021-04-06 VITALS — BP 123/86 | HR 87 | Wt 250.1 lb

## 2021-04-06 DIAGNOSIS — E669 Obesity, unspecified: Secondary | ICD-10-CM | POA: Diagnosis not present

## 2021-04-06 DIAGNOSIS — Z3401 Encounter for supervision of normal first pregnancy, first trimester: Secondary | ICD-10-CM | POA: Diagnosis not present

## 2021-04-06 DIAGNOSIS — O209 Hemorrhage in early pregnancy, unspecified: Secondary | ICD-10-CM

## 2021-04-06 DIAGNOSIS — Z124 Encounter for screening for malignant neoplasm of cervix: Secondary | ICD-10-CM | POA: Diagnosis not present

## 2021-04-06 DIAGNOSIS — Z8279 Family history of other congenital malformations, deformations and chromosomal abnormalities: Secondary | ICD-10-CM

## 2021-04-06 DIAGNOSIS — F79 Unspecified intellectual disabilities: Secondary | ICD-10-CM | POA: Diagnosis not present

## 2021-04-06 LAB — POCT 1 HR PRENATAL GLUCOSE: Glucose 1 Hr Prenatal, POC: 133 mg/dL

## 2021-04-06 NOTE — Patient Instructions (Signed)
Take prenatal vitamin daily ?Will place referral to high risk OB clinic given your history of seizures ?Follow up here in 2 weeks until you can get in with high risk clinic ?Referring to genetic counseling ?We can draw genetic testing at next visit ? ?If you decide you want to terminate your pregnancy, here is the local clinic that provides abortions: ?[facility] ?[address] ?[place], [place] [address] ?[phone] ?[email or web address] ? ?Go to the MAU at [facility] at [facility] if: ?You have pain in your lower abdomen or pelvic area ?Your water breaks.  Sometimes it is a big gush of fluid, sometimes it is just a trickle that keeps getting your underwear wet or running down your legs ?You have vaginal bleeding.  ? ?Be well, ?Dr. Pollie Meyer  ?

## 2021-04-06 NOTE — Progress Notes (Signed)
[facility] ?Routine OB Visit ? ?Lisa [name] is a 26 y.o. G1P0000 at [redacted]w[redacted]d (via early u/s) who presents for routine follow up.  ? ?Patient reports she had cramping and passed a blood clot about 5 days ago. No bleeding since. Having some diarrhea today. Is not taking prenatal vitamin. ? ?She is contemplating terminating her pregnancy and wants information about this.  ? ?Spoke with mom via phone regarding questions answered at initial OB visit. Mom confirms no one in the family has a history of down syndrome, just fragile X. Mom does NOT have CF.  ? ?Patient and mother are both interested in genetic testing, as well as genetic counseling given family history of fragile X and patient's known intellectual disability.  ? ?Patient is not taking any psychotropic medications at present. Mood overall stable. Denies SI/HI. ? ?Exam: ? ?Gen: no acute distress, pleasant cooperative ?Psych: normal range of affect, well groomed, speech normal in rate and volume, normal eye contact  ?GU exam: normal appearing external genitalia without lesions. Vagina is moist with thin white discharge. Cervix normal in appearance and appears closed. No bleeding visible from cervix or in vaginal vault. No cervical motion tenderness or tenderness on bimanual exam. No adnexal masses. Chaperone present: Lisa [name], CMA  ? ?Assessment & Plan ? ?1. Routine prenatal care: ?- early 1hr GTT done today, normal at 133 (done for obesity) ?- reports cramping/bleeding 5 days ago - normal pelvic exam, cervix visually closed. Obtaining stat pelvic u/s to evaluate ?- provided information on local pregnancy termination resources ?- plan to rx aspirin at 12 weeks due to nulliparity & low SES ?- pap smear and STI testing done today ?- advised taking prenatal vitamin daily ? ?2. Seizure disorder: ?- confirmed with patient's mother that in addition to pseudoseizures, she does have a true seizure disorder ?- not on any antiepileptics at  present ?- had planned to refer to [facility], but given complexity of patient's social situation, feel she would benefit from being followed here in our clinic for her pregnancy since I know her quite well. Spoke with OB attending Dr. Adrian Blackwater about patient and he agrees patient is fine to stay in our clinic, just suggests she sees neurology at least once during the pregnancy. ? ?3. Asthma: well controlled, not needing albuterol at this time ? ?4. Psych: MDD/GAD/pseudoseizures/PTSD/intellectual disability ?- overall seems stable at this time, not on medications, denies SI/HI ? ?5. Social - mom is reportedly patient's legal guardian though no documentation visible in chart ?- spoke with [name] and asked that she provide copies of this documentation ?- doubt patient will be allowed to have custody of child given patient's own intellectual disability; mom reports she is prepared to assume custody of the baby after birth ?- Pregnancy Case Worker with [facility] [place] joined [place] briefly for part of this visit, her # is [phone] ? ?6. Family history of fragile X/patient with intellectual disability ?- plan for horizon & panorama at next visit after 10w GA ?- refer to genetic counseling ? ?Next prenatal visit in 2 weeks - seeing more frequently due to high risk social situation. Labor & fetal movement precautions discussed. ? ?Levert Feinstein, MD ?[facility]   ?

## 2021-04-11 ENCOUNTER — Encounter: Payer: Medicaid Other | Admitting: Obstetrics & Gynecology

## 2021-04-11 LAB — CYTOLOGY - PAP
Chlamydia: NEGATIVE
Comment: NEGATIVE
Comment: NEGATIVE
Comment: NEGATIVE
Comment: NORMAL
Diagnosis: UNDETERMINED — AB
High risk HPV: NEGATIVE
Neisseria Gonorrhea: NEGATIVE
Trichomonas: POSITIVE — AB

## 2021-04-12 ENCOUNTER — Telehealth: Payer: Self-pay | Admitting: *Deleted

## 2021-04-12 NOTE — Telephone Encounter (Signed)
Pt saw in her mychart that she was positive for trichomoniasis and wants to know if / when she will be treated.  Jone Baseman, CMA ? ?

## 2021-04-13 ENCOUNTER — Encounter: Payer: Self-pay | Admitting: Family Medicine

## 2021-04-13 DIAGNOSIS — A5901 Trichomonal vulvovaginitis: Secondary | ICD-10-CM | POA: Insufficient documentation

## 2021-04-13 MED ORDER — METRONIDAZOLE 500 MG PO TABS: 500.0000 mg | ORAL_TABLET | Freq: Two times a day (BID) | ORAL | 0 refills | Status: DC

## 2021-04-13 NOTE — Telephone Encounter (Signed)
Called patient, explained need for treatment ?Sent in flagyl 500mg  twice daily for 7 days ?Will need test of cure in 3+ weeks ?Advised abstaining from sex for 7 days, informing partner(s) so they can also be treated ? ?Leeanne Rio, MD  ?

## 2021-04-18 ENCOUNTER — Encounter: Payer: Self-pay | Admitting: Family Medicine

## 2021-04-18 ENCOUNTER — Ambulatory Visit (INDEPENDENT_AMBULATORY_CARE_PROVIDER_SITE_OTHER): Payer: Medicaid Other | Admitting: Family Medicine

## 2021-04-18 ENCOUNTER — Other Ambulatory Visit: Payer: Self-pay

## 2021-04-18 VITALS — BP 105/62 | HR 77 | Wt 245.0 lb

## 2021-04-18 DIAGNOSIS — G40909 Epilepsy, unspecified, not intractable, without status epilepticus: Secondary | ICD-10-CM

## 2021-04-18 DIAGNOSIS — R87612 Low grade squamous intraepithelial lesion on cytologic smear of cervix (LGSIL): Secondary | ICD-10-CM

## 2021-04-18 DIAGNOSIS — Z3401 Encounter for supervision of normal first pregnancy, first trimester: Secondary | ICD-10-CM

## 2021-04-18 DIAGNOSIS — O99350 Diseases of the nervous system complicating pregnancy, unspecified trimester: Secondary | ICD-10-CM

## 2021-04-18 MED ORDER — PRENATAL 27-0.8 MG PO TABS: 1.0000 | ORAL_TABLET | Freq: Every day | ORAL | 0 refills | Status: DC

## 2021-04-18 MED ORDER — ASPIRIN EC 81 MG PO TBEC: 81.0000 mg | DELAYED_RELEASE_TABLET | Freq: Every day | ORAL | 11 refills | Status: DC

## 2021-04-18 MED ORDER — CETIRIZINE HCL 10 MG PO TABS: 10.0000 mg | ORAL_TABLET | Freq: Every day | ORAL | 11 refills | Status: DC

## 2021-04-18 NOTE — Patient Instructions (Addendum)
It was great to see you again today! ? ?Go to the MAU at [place] at [facility] if: ?You have pain in your lower abdomen or pelvic area ?Your water breaks.  Sometimes it is a big gush of fluid, sometimes it is just a trickle that keeps getting your underwear wet or running down your legs ?You have vaginal bleeding.  ? ?Start aspirin 81mg  daily in 1 week ?Start prenatal vitamins now ?Sent in zyrtec for allergies ?Referring to neurology ? ?Follow up in 4 weeks as scheduled ? ?Be well, ?Dr. Ardelia Mems  ?

## 2021-04-23 NOTE — Progress Notes (Signed)
[facility] ?Faculty OB Visit ? ?Lisa [name] is a 26 y.o. G1P0000 at [redacted]w[redacted]d (via 6 week u/s) presenting for follow up.  ? ?Denies cramping/ctx, fluid leaking, vaginal bleeding. Still has not begun taking PNV. Is currently taking flagyl for trichomonas.  ? ?Desires postplacental IUD for contraception ?Accompanied by [name] today ?Patient denies SI/HI. Not on psych medications presently. Per patient and mother, has been doing better from mood standpoint lately. ?She has decided to continue with pregnancy ?Reports prior breakout of rash on R inner thigh, took pictures of it, seemed dry/itchy  ?Increase seasonal allergies, needs zyrtec refilled ? ?Assessment & Plan ? ?1. Routine prenatal care: ?- rx sent in for PNV ?- advised to begin aspirin in 1 week at 12wga ?- panorama & horizon obtained today, patient desires I tell her mother baby's sex once info is available ? ?2. Prior rash on inner thigh - unclear etiology of rash on inner thigh  ?- advised coming in if it recurs so we can examine it and swab it for HSV.  ?- Will plan to obtain HSV titers in 1 month, as if she is HSV-2 antibody positive I would advise valtrex suppression at 36w ? ?3. Seasonal allergies - refill zyrtec ? ?4. 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 No recent episodes and has been off medications for years. ?- as previously discussed with OB attending Dr. Adrian Blackwater, ok to remain at [facility] for [facility] given her complex social situation ?- refer to neurology for evaluation ? ?5. Social - mom planning to get guardianship paperwork from courthouse and bring [place] a copy ? ?6. Psych - MDD/GAD/pseudoseizures/PTSD/intellectual disability ?- overall doing well off medications ?- continue to monitor closely ? ?7. Trichomonas in pregnancy ?- currently being treated with metronidazole ?- counseled on safe sex ?- plan test of cure in a month ? ?8. Family history of fragile X  ?- await genetic  counseling appointment  ?- horizon & panorama drawn today ? ?9. ASCUS pap - given prior abnormal paps (LSIL in 2020 and 2021) needs colpo, defer this to postpartum, discussed w/ patient and mother today ? ?Next prenatal visit in 4 weeks. Pain & bleeding precautions discussed. ? ?Levert Feinstein, MD ?[facility]   ?

## 2021-04-27 ENCOUNTER — Telehealth: Payer: Self-pay

## 2021-04-27 NOTE — Telephone Encounter (Signed)
Patient calls nurse line requesting to be seen sooner than 5/9 for routine prenatal visit.  ? ?Patient reports "nothing is wrong, I just want to get checked out and be a good mom for my baby."  ? ?Patient denies any bleeding or cramping.  ? ?Patient scheduled for tomorrow.  ?

## 2021-04-28 ENCOUNTER — Ambulatory Visit (INDEPENDENT_AMBULATORY_CARE_PROVIDER_SITE_OTHER): Payer: Medicaid Other | Admitting: Student

## 2021-04-28 VITALS — BP 108/72 | HR 80 | Wt 242.8 lb

## 2021-04-28 DIAGNOSIS — Z3A12 12 weeks gestation of pregnancy: Secondary | ICD-10-CM | POA: Diagnosis present

## 2021-04-28 NOTE — Patient Instructions (Addendum)
It was great to see you! Thank you for allowing me to participate in your care! ? ?Our plans for today:  ?-Pick up and start taking prenatal vitamin and baby aspirin (81 mg) today ?-Return for your next appointment on May 9th ? ?Take care and seek immediate care sooner if you develop any concerns.  ? ?Dr. Erick Alley, DO ?[facility] ? ?

## 2021-04-28 NOTE — Progress Notes (Signed)
?  Patient Name: Lisa [name] ?Date of Birth: 20-Feb-1995 ?[facility] Prenatal Visit ? ?Lisa [name] is a 26 y.o. G1P0000 at 100w3d here per request to be seen prior to next routine follow up scheduled for 05/16/21 and wanted to know if we can do an anatomy US. She is dated by early ultrasound.  She reports backache, no bleeding, no contractions, no leaking, and vaginal irritation.  She is having mild back pain in mid and lower back for past couple weeks, worse at night and after sleeping. States not eating like normal d/t nausea and decreased appetite, has lost about 8 lbs since she found out she was pregnant.  See flow sheet for details. ? ?Was advised to start bASA at 12 weeks which pt has not started, states she was not aware. Has not started taking PNV but plans on getting them today.  ?Pt has hv significant for MDD/GAD/pseudoseizures/PTSD/intellectual disability. Not currently on medications.  ? ?Vitals:  ? 04/28/21 1023  ?BP: 108/72  ?Pulse: 80  ? ? ? ? ?A/P: Pregnancy at [redacted]w[redacted]d.   ? ?Dating reviewed, dating tab is correct ?Fetal heart tones  not detected. Preceptor, Dr. McDiarmid aware and also attempted to find fetal heart tones.  Reassured patient that 12 weeks can be too early to hear with the Doppler.  We will try again at next visit.* ? ?I explained to patient that we do not do an ultrasound until the anatomy ultrasound after 18 weeks and that we cannot do an ultrasound today.  I explained to patient that right now we only need to see her every month.  We also discussed that her weight loss of 8 pounds is not overly concerning at this time and it can be normal for mother to lose weight within the first trimester due to loss of appetite and nausea.  We will continue to monitor her weight in hopes that her appetite resumes and she feels less nauseous during the second trimester which she has just entered.  I encouraged patient to keep food she can tolerate around at all times and to  attempt to eat small meals or snacks throughout the day rather than trying to eat 3 large meals which seems to be more difficult for her to do right now.  I answered all of her questions about pregnancy in general and reassured her that everything she is experiencing currently including the low back pain at night is normal especially as she is lying in different positions now that she is pregnant.  Patient stated she felt reassured after having this visit. She is advised to pick up prenatal vitamins and baby aspirin today and start them.  She had mentioned that finances were an issue and she was waiting for insurance to cover the prenatal vitamins.  She stated the prenatal vitamins are now at her pharmacy and she should be able to pick them up. She plans to return for her routine prenatal visit scheduled for May 9th.  ? ?  ?

## 2021-05-05 ENCOUNTER — Encounter: Payer: Self-pay | Admitting: Family Medicine

## 2021-05-08 ENCOUNTER — Telehealth: Payer: Self-pay | Admitting: Family Medicine

## 2021-05-08 ENCOUNTER — Encounter: Payer: Self-pay | Admitting: Family Medicine

## 2021-05-08 DIAGNOSIS — Z148 Genetic carrier of other disease: Secondary | ICD-10-CM

## 2021-05-08 DIAGNOSIS — Z3400 Encounter for supervision of normal first pregnancy, unspecified trimester: Secondary | ICD-10-CM

## 2021-05-08 NOTE — Telephone Encounter (Signed)
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 ? ?This was not unexpected as patient has three nephews with known fragile X syndrome (the 3 are her sister Dominique's children). Both Dondra Prader and Evangelina are daughters of [name] (who is also my patient). ? ?Notably patient's fetus is female, something mother Marianna Fuss knows but Larinda does NOT yet know (waiting on gender reveal event - Kahlea asked I only inform her mother of baby's sex). I spoke privately with Marianna Fuss and informed her that Fragile X is often less severe in females than in males, so phenotype may be different than they are used to seeing in Carmie's nephews. Advised genetic counselor will provide more information at the time of that visit. Also encouraged them to do gender reveal event prior to [facility] appointment so that baby's sex can be freely discussed during that appointment. ? ?I messed MFM genetic counselor Analyssa Tallas to get appointment expedited for genetic counseling in light of these results. She is going to have someone reach out to them for scheduling. ? ?Latrelle Dodrill, MD  ?

## 2021-05-10 ENCOUNTER — Encounter: Payer: Self-pay | Admitting: Family Medicine

## 2021-05-11 ENCOUNTER — Telehealth: Payer: Self-pay | Admitting: *Deleted

## 2021-05-11 ENCOUNTER — Ambulatory Visit: Payer: Medicaid Other | Attending: Family Medicine

## 2021-05-11 ENCOUNTER — Ambulatory Visit: Payer: Self-pay | Admitting: Genetics

## 2021-05-11 DIAGNOSIS — Z3401 Encounter for supervision of normal first pregnancy, first trimester: Secondary | ICD-10-CM

## 2021-05-11 DIAGNOSIS — O99212 Obesity complicating pregnancy, second trimester: Secondary | ICD-10-CM

## 2021-05-11 NOTE — Progress Notes (Addendum)
Name: Lisa [name] Lisa [name] [name] Indication: Maternal Lisa [name] [name] carrier for Fragile X syndrome  DOB: June 17, 1995 Age: 26 y.o.   EDC: 11/07/2021 LMP: 01/24/2021 Referring Provider:  Latrelle Dodrill, MD  EGA: [redacted]w[redacted]d Genetic Counselor: Lisa [name] Dunk, MS, CGC  OB Hx: G1P0 Date of Appointment: 05/11/2021  Accompanied by: Lisa [name] Lisa [name] [name] [name] Lisa [name] [name]) was present via telephone Face to Face Time: 60 Minutes   Previous Testing Completed: CBC from 04/01/2021 reviewed. MCV within normal limits. It is unlikely that Lisa [name] Lisa [name] [name] is a beta thalassemia carrier or an alpha thalassemia carrier of the double-gene deletion. Individuals with a normal MCV may be single-gene deletion carriers, but it is unlikely that the current pregnancy would be affected with alpha or beta thalassemia major. Hemoglobin fractionation cascade from 03/17/2021 reviewed. No abnormal hemoglobin bands were noted.  Lisa [name] [name] previously completed Non-Invasive Prenatal Screening (NIPS) in this pregnancy. The result is low risk. This screening significantly reduces the risk that the current pregnancy has Down syndrome, Trisomy 37, Trisomy 9, and common sex chromosome conditions, however, the risk is not zero given the limitations of NIPS. Additionally, there are many genetic conditions that cannot be detected by NIPS.  Lisa [name] Lisa [name] [name] previously completed carrier screening. She screened to be a Lisa [name] [name] carrier for Fragile X syndrome. She screened to not be a carrier for Cystic Fibrosis (CF), Spinal Muscular Atrophy (SMA), and Duchenne/Becker Muscular Dystrophy (DMD). A negative result on carrier screening reduces the likelihood of being a carrier, however, does not entirely rule out the possibility.   Family History: A pedigree was created and scanned into Epic under the Media tab. There are two potential biological fathers for the current pregnancy. At this time Lisa [name] Lisa [name] [name] is not able to provide information about either potential biological father or their family  histories.  Lisa [name] Lisa [name] [name] and her [name] Lisa [name] [name]) reported that Lisa [name] Lisa [name] [name] has behavior challenges, difficulty learning, a low IQ, and was in a special education class in school. Lisa [name] Lisa [name] [name]'s full brother (26 years) is reported to have intellectual disability and has not had any genetic testing. Lisa [name] Lisa [name] [name]'s maternal half-sister was stillborn. It is described that "she drowned in the amniotic sac".  Lisa [name] Lisa [name] [name]'s other maternal half-sister is reported to have mental health challenges. This sister is reported to have three sons affected with Fragile X syndrome. Genetic testing records for the three sons are not available for genetic counseling to review at this time. It is additionally reported that these three sons have not seen a pediatric geneticist. Lisa [name] Lisa [name] [name] father is also reported to have intellectual disability. Lisa [name] Lisa [name] [name] reports she has ovarian insufficiency. She reports her mother has mental illness and ovarian insufficiency. Lisa [name] Lisa [name] [name] reports two of her sister's children have intellectual disability.     Genetic Counseling:   Lisa [name] [name] Carrier for [employer] Syndrome. Fragile X Syndrome is caused by a mutation in the FMR1 gene known as a CGG repeat. Humans typically have between 6 and 44 CGG repeats in the FMR1 gene. Lisa [name] Lisa [name] [name] carrier screen indicates that one of her X chromosomes has 29 CGG repeats, and her other X chromosome has 154 CGG repeats (Fragile X Lisa [name] [name]). Given this information we would not consider Lisa [name] Lisa [name] [name] to be affected with Fragile X syndrome, however, Lisa [name] [name] carriers may have behavior problems, social anxiety, and/or difficulty with social skills. Lisa [name] Lisa [name] [name] and her [name] Lisa [name] [name]) reported to genetic counseling that Lisa [name] Lisa [name] [name] has behavior challenges, difficulty learning, a low IQ, and was in a special education class in school. It was discussed with Lisa [name] Lisa [name] [name], in detail using visual aids, that there is a 50% risk she will pass on the X  chromosome with the expanded CGG repeat length in  each pregnancy. The chance the CGG repeat could expand from a Lisa [name] [name] to a full mutation (200 repeats) ranges from 95-100%. We reviewed that having a large number of CGG repeats causes the FMR1 gene to turn off and not work properly. Individuals who have 200 or greater CGG repeats are considered to be affected with Fragile X Syndrome and symptoms include intellectual disability, developmental delay, autism spectrum disorder, behavioral abnormalities, and characteristic physical features. It was discussed that males with more than 200 CGG repeats are more severely affected than females since males have one X chromosome and females have two X chromosomes. At this time, Lisa [name] Lisa [name] Lisa [name] [name] does not wish to know the sex of her current pregnancy, therefore, we reviewed risk if the pregnancy is female and also if the pregnancy is female. Given that Lisa [name] Lisa [name] Lisa [name] [name] is a Lisa [name] Lisa [name] [name] carrier we offered her the option of amniocentesis for prenatal diagnosis. Lisa [name] Lisa [name] Lisa [name] [name] declined amniocentesis and stated that she would like for her baby to be referred to pediatric genetics after birth for Fragile X genetic testing. Given Lisa [name] [name]'s self-reported difficulty with learning and low IQ, genetic counseling had her teach back the information discussed regarding risk to the current pregnancy. Lisa [name] Lisa [name] Lisa [name] [name] was able to successfully teach back the information described above so that her understanding could be confirmed.   Genetic counseling also briefly shared with Lisa [name] Lisa [name] Lisa [name] [name] that about 15-27% of women with a Lisa [name] [name] have a condition called Fragile X-Associated Primary Ovarian Insufficiency (FXPOI). These women may have irregular menstrual cycles, early-onset elevated levels of follicle stimulating hormone Lisa [name] [facility]), early menopause, and may have infertility or a more difficult time having children. Women who are Lisa [name] [name] carriers also have an 8-17% chance of developing a disorder known as Fragile X-Associated Tremor/Ataxia Syndrome (FXTAS). Women  with FXTAS have problems starting after age 25 that include problems with movement (ataxia), tremor, memory loss, loss of sensation in the lower extremities, and mental and behavioral changes.  Furthermore, given that Lisa [name] MartinBreyonna reported having difficulty learning, a low IQ, and was in a special education class in school it might be beneficial for her to have a consultation in an adult genetics clinic for further evaluation as typically symptoms of Fragile X Lisa [name] [name] carriers include behavior problems, social anxiety, and/or difficulty with social skills. Genetic counseling will reach out to the Fragile X clinic at [facility] to inquire about a potential referral for Lisa [name] Lisa [name] [name].  Finally, Lisa [name] Lisa [name] [name] and Lakesha report that the three individuals who are reported to be affected with Fragile X syndrome in their family have not seen a pediatric geneticist. Genetic counseling recommended that the provider who tested the three boys for Fragile X syndrome refer [facility] to [facility] as soon as possible for a consultation.     Testing/Screening Options:   Amniocentesis. This procedure is available for prenatal diagnosis. Possible procedural difficulties and complications that can arise include maternal infection, cramping, bleeding, fluid leakage, and/or pregnancy loss. The risk for pregnancy loss with an amniocentesis is 1/500. Per the [employer] College of Obstetricians and Gynecologists (ACOG) Practice Bulletin 162, all pregnant women should be offered prenatal assessment for aneuploidy by diagnostic testing regardless of maternal age or other risk factors. If indicated, genetic testing that could be ordered on an amniocentesis sample includes a fetal karyotype, fetal microarray, and testing for specific syndromes.     Patient Plan:  Proceed with: Lisa [name] Lisa [name] Lisa [name] [name] stated that she would like for her baby to be referred to pediatric genetics after birth for Fragile X  genetic testing. Genetic counseling will also reach  out to the Fragile X clinic at [facility] to inquire about a potential referral for [place]. *Genetic counseling heard back from the Fragile X clinic at [facility] on 06/12/21. Unfortunately, the Fragile X clinic at [facility] does not see adult patients over the age of 28. Given this information, we would recommend that Lisa [name] Lisa [name] [name] accompany her baby to the baby's pediatric genetic counseling appointment.  Informed consent was obtained. All questions were answered.  Declined: Amniocentesis   Thank you for sharing in the care of Braelynne with [place].  Please do not hesitate to contact us if you have any questions.  Lisa [name] Dunk, MS, [facility]

## 2021-05-11 NOTE — Telephone Encounter (Signed)
Pt would like to be referred to the [facility] if appropriate. ? ?To PCP. ? ?If the patient needs: ? ? An appointment - route response to [facility] admin ? A callback from clinic staff - route response to your team ? ? ?Only route to RN Team: form completions or if RN team directly requests response sent to them ? ? ?Jone Baseman, CMA ? ?

## 2021-05-11 NOTE — Telephone Encounter (Signed)
Referral placed Lisa [name] Lisa Daryon [name], MD  

## 2021-05-16 ENCOUNTER — Encounter: Payer: Self-pay | Admitting: Family Medicine

## 2021-05-16 ENCOUNTER — Ambulatory Visit (INDEPENDENT_AMBULATORY_CARE_PROVIDER_SITE_OTHER): Payer: Medicaid Other | Admitting: Family Medicine

## 2021-05-16 ENCOUNTER — Other Ambulatory Visit (HOSPITAL_COMMUNITY)
Admission: RE | Admit: 2021-05-16 | Discharge: 2021-05-16 | Disposition: A | Discharge status: Home / Self Care | Payer: Medicaid Other | Source: Ambulatory Visit | Attending: Family Medicine | Admitting: Family Medicine

## 2021-05-16 VITALS — BP 115/80 | HR 87 | Temp 98.3°F | Wt 240.8 lb

## 2021-05-16 DIAGNOSIS — R768 Other specified abnormal immunological findings in serum: Secondary | ICD-10-CM

## 2021-05-16 DIAGNOSIS — Z148 Genetic carrier of other disease: Secondary | ICD-10-CM

## 2021-05-16 DIAGNOSIS — Z3402 Encounter for supervision of normal first pregnancy, second trimester: Secondary | ICD-10-CM | POA: Diagnosis present

## 2021-05-16 DIAGNOSIS — N6452 Nipple discharge: Secondary | ICD-10-CM

## 2021-05-16 LAB — POCT URINALYSIS DIP (MANUAL ENTRY)
Bilirubin, UA: NEGATIVE
Blood, UA: NEGATIVE
Glucose, UA: NEGATIVE mg/dL
Ketones, POC UA: NEGATIVE mg/dL
Leukocytes, UA: NEGATIVE
Nitrite, UA: NEGATIVE
Protein Ur, POC: 30 mg/dL — AB
Spec Grav, UA: 1.02 (ref 1.010–1.025)
Urobilinogen, UA: 1 E.U./dL
pH, UA: 7 (ref 5.0–8.0)

## 2021-05-16 LAB — POCT UA - MICROSCOPIC ONLY
Epithelial cells, urine per micros: >20
RBC, Urine, Miroscopic: NONE SEEN (ref 0–2)
WBC, Ur, HPF, POC: NONE SEEN (ref 0–5)

## 2021-05-16 MED ORDER — DOXYLAMINE-PYRIDOXINE 10-10 MG PO TBEC: DELAYED_RELEASE_TABLET | ORAL | 2 refills | Status: DC

## 2021-05-16 MED ORDER — ACETAMINOPHEN 500 MG PO TABS
500.0000 mg | ORAL_TABLET | Freq: Four times a day (QID) | ORAL | 0 refills | Status: DC | PRN
Start: 2021-05-16 — End: 2021-07-13

## 2021-05-16 NOTE — Patient Instructions (Addendum)
It was great to see you again today! ? ?Call neurology office [facility]) to schedule an appointment.  ?[phone] ? ?Go to the MAU at [facility] if: ?You have pain in your lower abdomen or pelvic area ?Your water breaks.  Sometimes it is a big gush of fluid, sometimes it is just a trickle that keeps getting your underwear wet or running down your legs ?You have vaginal bleeding.  ? ?Ordered ultrasound of L breast to evaluate bloody discharge from nipple ?We may also end up needing MRI - will start with ultrasound. ? ?Sent in nausea medication for you to take (diclegis) ?Two tablets at bedtime on days 1 and 2; if symptoms persist, take 1 tablet in morning and 2 tablets at bedtime on day 3; if symptoms persist, may further increase to 1 tablet in morning, 1 tablet mid-afternoon, and 2 tablets at bedtime on day 4  ? ?Bloodwork today - spinal defect screening and herpes testing ? ?Sent in tylenol for you ? ?Checking urine for signs of infection ? ?Follow up in 3 weeks, sooner if needed ? ?Be well, ?Dr. Pollie Meyer ? ? ?Therapy and Counseling Resources ?Most providers on this list will take Medicaid. Patients with commercial insurance or Medicare should contact their insurance company to get a list of in network providers. ? ?[facility] (spanish speaking therapist available)(habla espanol)(take medicare and medicaid)  ?[address], [place], [place] [address], [place] ?[email or web address] ?[phone] ? ?[facility]iatry [facility] Counseling ?[address] [place] [place], [place] [address] ?[phone] ? ?[facility]  ? [address], Suite [place], [place] [address]      [phone] ? ?[facility] (spanish available) ?[address]  [place], [place] [address] ?[phone] ? ?[facility] (take medicaid and medicare) ?[address]  [place], [place] [address]       [phone]    ? ?[facility]  (virtual only) ?[phone] ? ?[facility] ?[address], [place], [place] [phone] ? ?[facility]:  [address] [place] [phone] ? ?[facility]:  ?Coralie Carpen [phone] ? ?[facility] (under & uninsured) ?[address]   [place] [phone]    [email or web address]   ? ? [facility] ?[address]  [place]    [phone] ? ?[facility] ?[facility] -[address]     Phone:  [phone]     [facility]-  [address]  [phone]  ? ?[facility] ?[address] [place], [place] [phone] ? ?[facility]: [address] [place], [place], [place]  [phone]  ? ?[facility] (Spanish therapist) [email or web address].[email or web address]  ?[address] [place]  [address]   [place] [address]    [phone]   ? ?[facility]   ?[employer],  [place], [place]  [phone]  ? ?[facility]  ?[address] [place]  [phone] ? ?[facility]  ?[address] [place], [place]        [phone] ? ?Open Access/Walk In Clinic under & uninsured ? ?[facility]  ?[address] [place], [place] ?Front Line [phone] ?Crisis [phone] ? ?[facility] [address],  ?(Spanish)   [address], [place]: [phone]) 8:30 - 12; 1 - 2:30 ? ?  [facility] [employer],  ?[address] [place]    [phone]):8:30 - 12; 2 - 3PM ? ?[facility] [employer],  ?[address]  [place]; [phone]):   Mon - Fri 8 AM - 5 PM ? ?[facility] ?[address] [place]  MWF 12:30 to 3:00 or call to schedule an appointment  [phone] ? ?Specific Provider options ?[facility]  https://www.[email or web address]/us ?click on find a therapist  ?enter your zip code ?left  side and select or tailor a therapist for your specific need.  ? ?[facility] Provider Directory ?[email or web address]  (Medicaid)   Follow all drop down to find a provider ? ?Social Support program ?[facility] ?336) I7437963 or PhotoSolver.pl ?[address], [place], [place] [facility] support and educational  ? ?24- Hour Availability:  ? ?[facility]  ?[address] [place], [place] ?Front Line [phone] ?Crisis [phone] ? ?[facility] [phone] ? ?[employer] [facility]  [phone]  ? ?[facility] [employer]  [phone] (after hours) ? ?[facility]/Mobile Crisis   [phone] ? ?[place] National Suicide Hotline  279-027-8962 Len Childs) ? ?Call 911 or go to emergency room ? ?[employer]  [phone]);  [place]  ? ?[facility]  ?[phone]); [place], [place], [place], [place], [place], [place], [place]  ?

## 2021-05-16 NOTE — Progress Notes (Signed)
[facility] ?Routine Faculty Prenatal Visit ? ?Lisa [name] is a 26 y.o. G1P0000 at [redacted]w[redacted]d (via 6w u/s) who presents to [facility] for routine follow up.  ? ?Denies cramping/ctx or vaginal bleeding. Taking PNV and baby aspirin. Admits to leaking some fluid, thinks it was urine, used to happen prior to pregnancy some but happening worse now. ? ?Has many complaints today: ?- low mid back pain - no dysuria. Worse with turning certain ways ?- pain in neck - thinks may have slept on neck wrong, sore in muscles. Has percocet listed as allergy but patient states she can take tylenol without problem. Has not tried any medications for neck or back pain. ?- feet swelling - were swollen the other day, not today ?- L breast discharge - had bloody nipple discharge yesterday, also thinks she felt a mass in L breast recently. ?- losing weight - has decreased appetite due to nausea, no vomiting. Not taking any medications for nausea at this time ? ?Postpartum Plans: ?- feeding: breast and bottle ?- pediatrician: Pollie Meyer at [facility] ?- contraception: desires BTL - discussed at length today. Patient and her mother are both in agreement about her getting BTL. They understand this is considered permanent sterilization. Patient does not desire any further children. ? ?Exam ?FHR: 155bpm ?GU: normal appearing external genitalia without lesions. Vagina is moist with normal discharge. Cervix normal in appearance. Chaperone present: Jake Seats, CMA  ?Breasts: bilateral breasts normal in appearance. No erythema, deformity, or nipple discharge. No palpable abnormal masses. No axillary lymphadenopathy. Chaperone Jake Seats CMA present. ? ?Assessment & Plan ? ?1. Routine prenatal care: ?- continue aspirin & pnv ?- AFP drawn today for neural tube defect screening ?- detailed anatomy scan ordered & scheduled (detailed due to obesity, fragile X carrier) ? ?2. 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 ?3. Seizure disorder - has not seen neuro, appears neuro has attempted to call her to schedule ?- provided phone number for neuro office so patient's mother may call to schedule ? ?4. Social - again discussed needing guardianship paperwork with mother. Mom reports being told at the courthouse that it may need to be renewed since patient is above 21, and paperwork was never renewed when patient turned 22. ?- will discuss w/ social work regarding next steps. I do believe patient requires guardianship to function safely in the world - she has a history of making unsafe, irrational decisions for herself. I anticipate that patient will not be able to have custody of this child. Patient's mother [name] is willing to take custody and parent the child along with patient.  ? ?5. Psych - MDD/GAD/pseudoseizures/PTSD/intellectual disability ?- overall stable at present ?- provided list of medicaid therapists for patient today ? ?6. Trichomonas in pregnancy ?- test of cure obtained today, also checked gc/chl given high risk sexual behavior ? ?7. 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 ?8. Back & neck pain - appears musculoskeletal , no concerning features.  ?- check UA today to ensure no signs of infection ?- rx sent for tylenol, advised ok to take in pregnancy ? ?9. L nipple bloody discharge and reported mass - clinical exam normal today.  Reviewed prior records - patient had similar issue in June 2021 and had ultrasound of RIGHT breast done also for bloody discharge. Radiologist had recommended breast MRI and surgical consultation but patient never followed up for this.  ?- ultrasound LEFT breast ordered & scheduled for patient   ? ?10. Weight loss - rx diclegis to help with nausea. Monitor wts at future visits. No vomiting, just nausea. Does not appear dehydrated today.  ? ?11. ASCUS pap - given prior abnormal paps (LSIL in 2020 and 2021) needs colpo, defer this to postpartum ? ?Next prenatal visit in 3 weeks. Pain & bleeding precautions discussed. ? ?Levert Feinstein, MD ?[facility]  ?

## 2021-05-17 LAB — CERVICOVAGINAL ANCILLARY ONLY
Chlamydia: NEGATIVE
Comment: NEGATIVE
Comment: NEGATIVE
Comment: NORMAL
Neisseria Gonorrhea: NEGATIVE
Trichomonas: NEGATIVE

## 2021-05-18 ENCOUNTER — Ambulatory Visit
Admission: RE | Admit: 2021-05-18 | Discharge: 2021-05-18 | Disposition: A | Discharge status: Home / Self Care | Payer: Medicaid Other | Source: Ambulatory Visit | Attending: Family Medicine | Admitting: Family Medicine

## 2021-05-18 DIAGNOSIS — N6452 Nipple discharge: Secondary | ICD-10-CM

## 2021-05-18 LAB — AFP, SERUM, OPEN SPINA BIFIDA
AFP MoM: 1.77
AFP Value: 44.6 ng/mL
Gest. Age on Collection Date: 15 weeks
Maternal Age At EDD: 25.7 yr
OSBR Risk 1 IN: 2747
Test Results:: NEGATIVE
Weight: 240 [lb_av]

## 2021-05-18 LAB — CULTURE, OB URINE

## 2021-05-18 LAB — URINE CULTURE, OB REFLEX

## 2021-05-18 LAB — HSV 2 ANTIBODY, IGG: HSV 2 IgG, Type Spec: 6.46 index — ABNORMAL HIGH (ref 0.00–0.90)

## 2021-05-18 LAB — HSV 1 ANTIBODY, IGG: HSV 1 Glycoprotein G Ab, IgG: 18 index — ABNORMAL HIGH (ref 0.00–0.90)

## 2021-05-22 DIAGNOSIS — R768 Other specified abnormal immunological findings in serum: Secondary | ICD-10-CM | POA: Insufficient documentation

## 2021-05-22 DIAGNOSIS — R7689 Other specified abnormal immunological findings in serum: Secondary | ICD-10-CM | POA: Insufficient documentation

## 2021-05-26 ENCOUNTER — Telehealth: Payer: Self-pay

## 2021-05-26 NOTE — Telephone Encounter (Signed)
Patient calls nurse line reporting abdominal cramping and spotting at 16 weeks.   Patient reports symptoms started last night. Patient reports abdominal pain is 8/10 and reports "every time I use the bathroom and wipe I see brown blood."  Patient reports she has been very dizzy today and feels faint. Patient denies any syncope episodes. Patient does report 3 episodes of vomiting.   Patient advised to go to MAU for evaluation.

## 2021-05-26 NOTE — Telephone Encounter (Signed)
Agree with MAU evaluation  Latrelle Dodrill, MD

## 2021-05-28 ENCOUNTER — Other Ambulatory Visit: Payer: Self-pay

## 2021-05-28 ENCOUNTER — Encounter (HOSPITAL_COMMUNITY): Payer: Self-pay | Admitting: Obstetrics and Gynecology

## 2021-05-28 ENCOUNTER — Inpatient Hospital Stay (HOSPITAL_COMMUNITY)
Admission: AD | Admit: 2021-05-28 | Discharge: 2021-05-28 | Disposition: A | Discharge status: Home / Self Care | Payer: Medicaid Other | Attending: Obstetrics and Gynecology | Admitting: Obstetrics and Gynecology

## 2021-05-28 DIAGNOSIS — R109 Unspecified abdominal pain: Secondary | ICD-10-CM | POA: Insufficient documentation

## 2021-05-28 DIAGNOSIS — O98812 Other maternal infectious and parasitic diseases complicating pregnancy, second trimester: Secondary | ICD-10-CM | POA: Insufficient documentation

## 2021-05-28 DIAGNOSIS — Z3A16 16 weeks gestation of pregnancy: Secondary | ICD-10-CM

## 2021-05-28 DIAGNOSIS — B3731 Acute candidiasis of vulva and vagina: Secondary | ICD-10-CM

## 2021-05-28 DIAGNOSIS — B379 Candidiasis, unspecified: Secondary | ICD-10-CM | POA: Insufficient documentation

## 2021-05-28 DIAGNOSIS — R12 Heartburn: Secondary | ICD-10-CM | POA: Diagnosis not present

## 2021-05-28 DIAGNOSIS — O26892 Other specified pregnancy related conditions, second trimester: Secondary | ICD-10-CM | POA: Diagnosis present

## 2021-05-28 LAB — WET PREP, GENITAL
Clue Cells Wet Prep HPF POC: NONE SEEN
Sperm: NONE SEEN
Trich, Wet Prep: NONE SEEN
WBC, Wet Prep HPF POC: >=10 — AB (ref <10)

## 2021-05-28 LAB — COMPREHENSIVE METABOLIC PANEL
ALT: 21 U/L (ref 0–44)
AST: 18 U/L (ref 15–41)
Albumin: 3.1 g/dL — ABNORMAL LOW (ref 3.5–5.0)
Alkaline Phosphatase: 61 U/L (ref 38–126)
Anion gap: 7 (ref 5–15)
BUN: <5 mg/dL — ABNORMAL LOW (ref 6–20)
CO2: 22 mmol/L (ref 22–32)
Calcium: 9.1 mg/dL (ref 8.9–10.3)
Chloride: 107 mmol/L (ref 98–111)
Creatinine, Ser: 0.7 mg/dL (ref 0.44–1.00)
GFR, Estimated: >60 mL/min (ref >60)
Glucose, Bld: 73 mg/dL (ref 70–99)
Potassium: 3.9 mmol/L (ref 3.5–5.1)
Sodium: 136 mmol/L (ref 135–145)
Total Bilirubin: 0.6 mg/dL (ref 0.3–1.2)
Total Protein: 6.5 g/dL (ref 6.5–8.1)

## 2021-05-28 LAB — CBC
HCT: 34.8 % — ABNORMAL LOW (ref 36.0–46.0)
Hemoglobin: 12.3 g/dL (ref 12.0–15.0)
MCH: 28.3 pg (ref 26.0–34.0)
MCHC: 35.3 g/dL (ref 30.0–36.0)
MCV: 80 fL (ref 80.0–100.0)
Platelets: 217 10*3/uL (ref 150–400)
RBC: 4.35 MIL/uL (ref 3.87–5.11)
RDW: 13.2 % (ref 11.5–15.5)
WBC: 7.3 10*3/uL (ref 4.0–10.5)
nRBC: 0 % (ref 0.0–0.2)

## 2021-05-28 LAB — URINALYSIS, ROUTINE W REFLEX MICROSCOPIC
Bilirubin Urine: NEGATIVE
Glucose, UA: NEGATIVE mg/dL
Hgb urine dipstick: NEGATIVE
Ketones, ur: NEGATIVE mg/dL
Leukocytes,Ua: NEGATIVE
Nitrite: NEGATIVE
Protein, ur: 30 mg/dL — AB
Specific Gravity, Urine: 1.01 (ref 1.005–1.030)
pH: 7 (ref 5.0–8.0)

## 2021-05-28 LAB — LIPASE, BLOOD: Lipase: 23 U/L (ref 11–51)

## 2021-05-28 MED ORDER — ALUM & MAG HYDROXIDE-SIMETH 200-200-20 MG/5ML PO SUSP
30.0000 mL | Freq: Once | ORAL | Status: AC
  Administered 2021-05-28: 30 mL via ORAL
  Filled 2021-05-28: qty 30

## 2021-05-28 MED ORDER — LIDOCAINE VISCOUS HCL 2 % MT SOLN
15.0000 mL | Freq: Once | OROMUCOSAL | Status: AC
  Administered 2021-05-28: 15 mL via ORAL
  Filled 2021-05-28: qty 15

## 2021-05-28 MED ORDER — FAMOTIDINE 20 MG PO TABS: 20.0000 mg | ORAL_TABLET | Freq: Every day | ORAL | 0 refills | Status: DC

## 2021-05-28 MED ORDER — TERCONAZOLE 0.4 % VA CREA: 1.0000 | TOPICAL_CREAM | Freq: Every day | VAGINAL | 0 refills | Status: DC

## 2021-05-28 NOTE — MAU Note (Addendum)
Lisa [name] is a 26 y.o. at [redacted]w[redacted]d here in MAU reporting: constant sharp upper abdominal pain that began 3 days ago. Denies VB or LOF.  Also states has brown colored urine.  Denies dysuria and burning with urination.  Onset of complaint: 3 days ago Pain score: 8/10 Vitals:   05/28/21 1446  BP: 123/66  Pulse: 91  Resp: 19  Temp: 98.8 F (37.1 C)  SpO2: 98%     FHT: 158 bpm Lab orders placed from triage:   UA
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
## 2021-05-29 ENCOUNTER — Ambulatory Visit: Payer: Medicaid Other | Admitting: Student

## 2021-05-29 ENCOUNTER — Ambulatory Visit (INDEPENDENT_AMBULATORY_CARE_PROVIDER_SITE_OTHER): Payer: Medicaid Other | Admitting: Student

## 2021-05-29 VITALS — BP 108/79 | HR 81 | Ht 62.0 in | Wt 243.4 lb

## 2021-05-29 DIAGNOSIS — O26892 Other specified pregnancy related conditions, second trimester: Secondary | ICD-10-CM

## 2021-05-29 DIAGNOSIS — B3731 Acute candidiasis of vulva and vagina: Secondary | ICD-10-CM

## 2021-05-29 DIAGNOSIS — R12 Heartburn: Secondary | ICD-10-CM | POA: Diagnosis not present

## 2021-05-29 LAB — GC/CHLAMYDIA PROBE AMP (~~LOC~~) NOT AT ARMC
Chlamydia: NEGATIVE
Comment: NEGATIVE
Comment: NORMAL
Neisseria Gonorrhea: NEGATIVE

## 2021-05-29 NOTE — Patient Instructions (Signed)
It was great to see you! Thank you for allowing me to participate in your care!  It looks like at your visit yesterday, they diagnosed you with heart burn. They recommended a medication to help treat this. I recommend you pick this up, and the medicine for your yeast infection.   Our plans for today:  - Pick up/Start taking the pepcid for your belly pain - Start taking the Terazol for your yeast infection  Seek medical care if: - If you start having intense abdominal pain, feeling like cramps/contractions - If you have a gush/leakage of fluid from your vagina - If you have bleeding from your vagina - If you go 2 or more days without feeling baby movement  Take care and seek immediate care sooner if you develop any concerns.   Dr. Bess Kinds, MD [facility]

## 2021-05-29 NOTE — Progress Notes (Signed)
  SUBJECTIVE:   CHIEF COMPLAINT / HPI:   Stomach Pain: Almost fainted last night. Standing in brother room, and got light headed and dizzy. Decided to go lay down. Ate and then went to sleep until 10 am. Belly pain started 4 days ago, with some back pain. No fever, nausea, did throw up some water two days ago, but none since. Notes that her pee is a little brown when looking in toilet bowl, but no discharge. Belly pain is sharp pain, feels like being poked with a needle. Is constant, but sometimes comes and goes. Pain located in center of the belly, above umbilicale. Pain is tightening feeling at some times, then it'll let go, 20 min later. No pain or symptoms with meals, although soda's will hurt her stomach some times. Last bowel movement Friday.   Does feel baby movement sometimes. Can go days without feeling.  Had pain lasting 5 min tightening and painful.  Went to bathroom after that, and had small gush of fluid, was clear, didn't seem like she had to pee.   PERTINENT  PMH / PSH: 16 wk 6 d pregnant, ADHD, developmental delay  OBJECTIVE:  BP 108/79   Pulse 81   Ht 5\' 2"  (1.575 m)   Wt 243 lb 6.4 oz (110.4 kg)   LMP 01/24/2021   SpO2 98%   BMI 44.52 kg/m   General: NAD, pleasant, able to participate in exam Cardiac: RRR, no murmurs auscultated. Respiratory: CTAB, normal effort, no wheezes, rales or rhonchi Abdomen: soft, TTP in epigastric region/lateral abdomen, non-distended,  Extremities: warm and well perfused, no edema or cyanosis. Skin: warm and dry, no rashes noted Neuro: alert, no obvious focal deficits, speech normal Psych: Normal affect and mood Pelvic: Normal appearing external genitalia, vaginal canal normal and non-erythematous, no pooling of clear liquid, notable white discharge, Cervix visualized and normal in appearance  ASSESSMENT/PLAN:  Reflux/Heart Burn Patient seen in MAU yesterday for abdominal pain that improved after GI cocktail.  Patient diagnosed with  heartburn and prescribed Pepcid.  Patient has yet to pick up medication.  -Encourage patient to pick up Pepcid and to start taking -Return precautions given  Yeast Infection Patient noted to have yeast infection on MAU visit yesterday.  White discharge noted on pelvic exam, consistent with yeast infection. -Encourage patient to pick up Terazol and start taking  No problem-specific Assessment & Plan notes found for this encounter.   No orders of the defined types were placed in this encounter.  No orders of the defined types were placed in this encounter.  No follow-ups on file. @SIGNNOTE @

## 2021-06-08 ENCOUNTER — Ambulatory Visit: Payer: Self-pay

## 2021-06-08 ENCOUNTER — Ambulatory Visit (INDEPENDENT_AMBULATORY_CARE_PROVIDER_SITE_OTHER): Payer: Medicaid Other | Admitting: Family Medicine

## 2021-06-08 VITALS — BP 104/47 | HR 91 | Temp 97.6°F | Wt 243.0 lb

## 2021-06-08 DIAGNOSIS — Z3402 Encounter for supervision of normal first pregnancy, second trimester: Secondary | ICD-10-CM

## 2021-06-08 DIAGNOSIS — R768 Other specified abnormal immunological findings in serum: Secondary | ICD-10-CM

## 2021-06-08 NOTE — Patient Instructions (Signed)
It was great to see you again today.  Go to the [facility] at [facility] if: You have pain in your lower abdomen or pelvic area Your water breaks.  Sometimes it is a big gush of fluid, sometimes it is just a trickle that keeps getting your underwear wet or running down your legs You have vaginal bleeding.   Be well, Dr. Kaimana Lurz 

## 2021-06-08 NOTE — Progress Notes (Signed)
[facility] Routine Faculty Prenatal Visit  Lisa [name] is a 26 y.o. G1P0000 at [redacted]w[redacted]d (via 6w u/s) who presents to [facility] for routine follow up.   Denies cramping/ctx or vaginal bleeding. Taking PNV and baby aspirin. Thinks she has begun to feel baby move. Endorses ongoing nausea, no vomiting. Not eating much due to feeling nauseated. Tried the diclegis once but it didn't help so she didn't keep taking it. Also feels dizzy, thinks due to not eating. Has taken tylenol some for headaches which has helped. Endorses some pain on sides of lower abdomen especially when turning over in bed or standing up.  Exam BP (!) 104/47   Pulse 91   Temp 97.6 F (36.4 C)   Wt 243 lb (110.2 kg)   LMP 01/24/2021   BMI 44.45 kg/m   FHR: 156 bpm  Assessment & Plan  1. Routine prenatal care: - continue aspirin & pnv - reviewed AFP results, normal - has appointment 6/6 for detailed anatomy scan - pain reported by patient consistent with round ligament pain, reassured her  2. 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 Advised need to start valtrex suppression at 36w  3. Seizure disorder - has not seen neuro, appears neuro has attempted to call her to schedule - reminded mom to call to schedule  4. Social  - mom has been in touch with Christen Butter, clinic social worker for [facility].  - I will plan to talk to Marena Chancy to clarify what steps are needed regarding guardianship.  5. Psych - MDD/GAD/pseudoseizures/PTSD/intellectual disability - overall stable at present  6. Trichomonas in pregnancy - TOC negative on 5/9  7. 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 L nipple bloody discharge and reported mass - - ultrasound was unremarkable, per patient has not recurred - if recurs or persists would need another ultrasound vs mammogram, or surgical consultation.   9. Poor weight gain - has gained some since last visit - advised how to take diclegis for nausea, given written instructions, encouraged to take regularly - discussed small frequent snacks  10. ASCUS pap - given prior abnormal paps (LSIL in 2020 and 2021) needs colpo, defer this to postpartum  Next prenatal visit in 4 weeks. Pain & bleeding precautions discussed. I will contact patient about scheduling an appointment since I do not have clinic spots in 4 weeks.  Levert Feinstein, MD [facility]

## 2021-06-08 NOTE — Telephone Encounter (Signed)
Pt called to find out information about female condom. Pt advised per CDC if she uses female condom, her partner should not use condom to avoid tearing.

## 2021-06-08 NOTE — Telephone Encounter (Signed)
Summary: questions regarding female condom   Pt requests that a nurse return her call as she has questions regarding female condom. Cb# [phone]      Called pt back but unable to LM, VM not set up.

## 2021-06-09 ENCOUNTER — Ambulatory Visit: Payer: Self-pay | Admitting: *Deleted

## 2021-06-09 NOTE — Telephone Encounter (Signed)
Chief Complaint: labor pains Symptoms: 4 months pregnant Frequency: very frequent Pertinent Negatives: Patient denies na Disposition: [] ED /[] Urgent Care (no appt availability in office) / [] Appointment(In office/virtual)/ []  [place] [facility]/ [] Home Care/ [] Refused Recommended Disposition /[] [place] [facility]/ []  Follow-up with PCP Additional Notes: EMS called, no triage questions asked due to pt in distress.  Reason for Disposition  [1] SEVERE abdominal pain (e.g., excruciating) AND [2] constant AND [3] present > 1 hour  Answer Assessment - Initial Assessment Questions 1. ONSET: "When did the symptoms begin?"        NA 2. CONTRACTIONS: "Describe the contractions that you are having." (e.g., duration, frequency, regularity, severity)     NA 3. EDD: "What date are you expecting to deliver?"     NA 4. PARITY: "Have you had a baby before?" If Yes, ask: "How long did the labor last?"     NA 5. FETAL MOVEMENT: "Has the baby's movement decreased or changed significantly from normal?"     NA 6. OTHER SYMPTOMS: "Do you have any other symptoms?" (e.g., leaking fluid from vagina, fever, hand/facial swelling)     NA  Protocols used: Pregnancy - Labor - Preterm-A-AH

## 2021-06-13 ENCOUNTER — Ambulatory Visit: Payer: Medicaid Other

## 2021-06-13 ENCOUNTER — Ambulatory Visit: Payer: Medicaid Other | Attending: Family Medicine

## 2021-06-13 ENCOUNTER — Other Ambulatory Visit: Payer: Self-pay | Admitting: *Deleted

## 2021-06-13 ENCOUNTER — Ambulatory Visit: Payer: Medicaid Other | Attending: Family Medicine | Admitting: Obstetrics

## 2021-06-13 ENCOUNTER — Encounter: Payer: Self-pay | Admitting: *Deleted

## 2021-06-13 DIAGNOSIS — Z363 Encounter for antenatal screening for malformations: Secondary | ICD-10-CM | POA: Diagnosis not present

## 2021-06-13 DIAGNOSIS — Z3402 Encounter for supervision of normal first pregnancy, second trimester: Secondary | ICD-10-CM

## 2021-06-13 DIAGNOSIS — O99352 Diseases of the nervous system complicating pregnancy, second trimester: Secondary | ICD-10-CM | POA: Diagnosis not present

## 2021-06-13 DIAGNOSIS — Z3A19 19 weeks gestation of pregnancy: Secondary | ICD-10-CM | POA: Insufficient documentation

## 2021-06-13 DIAGNOSIS — O321XX0 Maternal care for breech presentation, not applicable or unspecified: Secondary | ICD-10-CM | POA: Diagnosis not present

## 2021-06-13 DIAGNOSIS — G40909 Epilepsy, unspecified, not intractable, without status epilepticus: Secondary | ICD-10-CM | POA: Insufficient documentation

## 2021-06-13 DIAGNOSIS — Z148 Genetic carrier of other disease: Secondary | ICD-10-CM | POA: Diagnosis not present

## 2021-06-13 DIAGNOSIS — O99212 Obesity complicating pregnancy, second trimester: Secondary | ICD-10-CM | POA: Insufficient documentation

## 2021-06-13 NOTE — Progress Notes (Signed)
MFM Note  Lisa [name] was seen for a detailed fetal anatomy scan due to maternal obesity with a BMI of 46.  The patient has an intellectual disability and psychiatric issues.  She is not currently treated with any psychiatric medications.  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  Her sister has 3 children with Fragile X syndrome who are severely disabled.  The patient already had genetic counseling regarding her increased risk of having a child with Fragile X syndrome.  She denies any other significant past medical history and denies any problems in her current pregnancy.    She had a cell free DNA test earlier in her pregnancy which indicated a low risk for trisomy 29, 55, and 13. A female fetus is predicted.   She was informed that the fetal growth and amniotic fluid level were appropriate for her gestational age.   There were no obvious fetal anomalies noted on today's ultrasound exam.  However, the views of the fetal anatomy were limited today due to the fetal position and maternal body habitus.  The patient was informed that anomalies may be missed due to technical limitations. If the fetus is in a suboptimal position or maternal habitus is increased, visualization of the fetus in the maternal uterus may be impaired.  The following were discussed during today's consultation:  Premutation carrier for Fragile X syndrome  The patient and her mother were advised that due to the large number of trinucleotide repeats (154) that she carries, her baby is at significant risk of having fragile X syndrome.  She understands that should her child have fragile X syndrome, the child may have developmental delays, learning disabilities, intellectual disability, and social and behavioral problems.  She was advised that prenatal diagnosis of Fragile X syndrome in her fetus is available via an amniocentesis procedure today.  She  declined an amniocentesis and will have her baby tested after birth.  Obesity in pregnancy  The recommended total weight gain in pregnancy for obese women is between 10 to 20 pounds.  Due to obesity, she should be screened for gestational diabetes at 71 weeks.  As maternal obesity may present challenges associated with the management of anesthesia, an anesthesia consult should be obtained when she is admitted in labor.  We will continue to follow her with monthly growth scans.    As her BMI is greater than 40, weekly fetal testing will be started at around 34 weeks.  The patient and her mother stated that they understood everything that was discussed with them today and all of their questions were answered.  A follow-up exam was scheduled in 4 weeks to assess the fetal growth and to complete the views of the fetal anatomy.  A total of 45 minutes was spent counseling and coordinating the care for this patient.  Greater than 50% of the time was spent in direct face-to-face contact.

## 2021-06-14 ENCOUNTER — Telehealth: Payer: Self-pay
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
## 2021-06-15 ENCOUNTER — Ambulatory Visit: Payer: Self-pay | Admitting: *Deleted
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
## 2021-06-23 ENCOUNTER — Telehealth: Payer: Self-pay

## 2021-06-23 NOTE — Telephone Encounter (Signed)
Patient calls nurse line regarding abdominal pain and vomiting. Patient is 20.[redacted] weeks pregnant. Patient reports that she has been having constant abdominal pain since last night and yellow emesis that started early this morning.   Patient also reports that she felt a "gush" when she went to the bathroom this morning. Denies vaginal bleeding. Reports fetal movement but is not feeling strong kicks yet.   Advised that patient be evaluated in MAU due to the above symptoms. Patient verbalizes understanding.   Veronda Prude, RN

## 2021-06-26 ENCOUNTER — Encounter: Payer: Self-pay | Admitting: Family Medicine

## 2021-06-28 ENCOUNTER — Encounter (HOSPITAL_COMMUNITY): Payer: Self-pay | Admitting: Obstetrics and Gynecology

## 2021-06-28 ENCOUNTER — Other Ambulatory Visit: Payer: Self-pay

## 2021-06-28 ENCOUNTER — Inpatient Hospital Stay (HOSPITAL_COMMUNITY)
Admission: AD | Admit: 2021-06-28 | Discharge: 2021-06-28 | Disposition: A | Discharge status: Home / Self Care | Payer: Medicaid Other | Attending: Obstetrics and Gynecology | Admitting: Obstetrics and Gynecology

## 2021-06-28 DIAGNOSIS — O26852 Spotting complicating pregnancy, second trimester: Secondary | ICD-10-CM | POA: Diagnosis not present

## 2021-06-28 DIAGNOSIS — Z3A21 21 weeks gestation of pregnancy: Secondary | ICD-10-CM | POA: Diagnosis not present

## 2021-06-28 DIAGNOSIS — R1030 Lower abdominal pain, unspecified: Secondary | ICD-10-CM | POA: Diagnosis not present

## 2021-06-28 DIAGNOSIS — R109 Unspecified abdominal pain: Secondary | ICD-10-CM

## 2021-06-28 DIAGNOSIS — R519 Headache, unspecified: Secondary | ICD-10-CM | POA: Insufficient documentation

## 2021-06-28 DIAGNOSIS — O26899 Other specified pregnancy related conditions, unspecified trimester: Secondary | ICD-10-CM | POA: Diagnosis not present

## 2021-06-28 DIAGNOSIS — O368120 Decreased fetal movements, second trimester, not applicable or unspecified: Secondary | ICD-10-CM | POA: Diagnosis not present

## 2021-06-28 DIAGNOSIS — O26892 Other specified pregnancy related conditions, second trimester: Secondary | ICD-10-CM | POA: Insufficient documentation

## 2021-06-28 LAB — URINALYSIS, ROUTINE W REFLEX MICROSCOPIC
Bilirubin Urine: NEGATIVE
Glucose, UA: NEGATIVE mg/dL
Hgb urine dipstick: NEGATIVE
Ketones, ur: 20 mg/dL — AB
Leukocytes,Ua: NEGATIVE
Nitrite: NEGATIVE
Protein, ur: 30 mg/dL — AB
Specific Gravity, Urine: 1.024 (ref 1.005–1.030)
pH: 6 (ref 5.0–8.0)

## 2021-06-28 LAB — WET PREP, GENITAL
Clue Cells Wet Prep HPF POC: NONE SEEN
Sperm: NONE SEEN
Trich, Wet Prep: NONE SEEN
WBC, Wet Prep HPF POC: <10 (ref <10)
Yeast Wet Prep HPF POC: NONE SEEN

## 2021-06-28 MED ORDER — ACETAMINOPHEN 500 MG PO TABS
1000.0000 mg | ORAL_TABLET | Freq: Once | ORAL | Status: AC
  Administered 2021-06-28: 1000 mg via ORAL
  Filled 2021-06-28: qty 2
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
## 2021-06-28 NOTE — MAU Note (Signed)
Lisa [name] is a 26 y.o. at [redacted]w[redacted]d here in MAU reporting: DFM for the past couple of days. Abdominal pain. Had a little bit of bleeding this AM but none since then. No LOF.   Onset of complaint: on going  Pain score: 7/10  Vitals:   06/28/21 1000  BP: 113/89  Pulse: 84  Resp: 16  Temp: 98.5 F (36.9 C)  SpO2: 99%     FHT:154  Lab orders placed from triage: UA

## 2021-06-29 LAB — GC/CHLAMYDIA PROBE AMP (~~LOC~~) NOT AT ARMC
Chlamydia: NEGATIVE
Comment: NEGATIVE
Comment: NORMAL
Neisseria Gonorrhea: NEGATIVE

## 2021-07-06 ENCOUNTER — Telehealth: Payer: Self-pay

## 2021-07-06 NOTE — Telephone Encounter (Signed)
Patient calls nurse line regarding SHOB and chest pain. Patient states that [facility] started approx 2 nights ago. Reports that she will wake up "gasping for air."   Currently patient states that she feels short of breath and is having tightening in her chest.   Advised that patient proceed to MAU for further evaluation. Patient verbalizes understanding.   Veronda Prude, RN

## 2021-07-10 ENCOUNTER — Encounter: Payer: Self-pay | Admitting: *Deleted

## 2021-07-10 ENCOUNTER — Ambulatory Visit: Payer: Medicaid Other | Attending: Obstetrics

## 2021-07-10 ENCOUNTER — Ambulatory Visit: Payer: Medicaid Other | Admitting: *Deleted

## 2021-07-10 VITALS — BP 130/74 | HR 72

## 2021-07-10 DIAGNOSIS — Z148 Genetic carrier of other disease: Secondary | ICD-10-CM

## 2021-07-10 DIAGNOSIS — O285 Abnormal chromosomal and genetic finding on antenatal screening of mother: Secondary | ICD-10-CM

## 2021-07-10 DIAGNOSIS — Z3A22 22 weeks gestation of pregnancy: Secondary | ICD-10-CM

## 2021-07-10 DIAGNOSIS — G40909 Epilepsy, unspecified, not intractable, without status epilepticus: Secondary | ICD-10-CM | POA: Diagnosis not present

## 2021-07-10 DIAGNOSIS — O99352 Diseases of the nervous system complicating pregnancy, second trimester: Secondary | ICD-10-CM | POA: Diagnosis not present

## 2021-07-10 DIAGNOSIS — O99212 Obesity complicating pregnancy, second trimester: Secondary | ICD-10-CM | POA: Insufficient documentation

## 2021-07-10 DIAGNOSIS — Z6841 Body Mass Index (BMI) 40.0 and over, adult: Secondary | ICD-10-CM | POA: Diagnosis present

## 2021-07-10 DIAGNOSIS — E669 Obesity, unspecified: Secondary | ICD-10-CM

## 2021-07-12 ENCOUNTER — Other Ambulatory Visit: Payer: Self-pay | Admitting: *Deleted

## 2021-07-12 ENCOUNTER — Encounter: Payer: Self-pay | Admitting: Family Medicine

## 2021-07-12 DIAGNOSIS — R638 Other symptoms and signs concerning food and fluid intake: Secondary | ICD-10-CM

## 2021-07-13 ENCOUNTER — Other Ambulatory Visit: Payer: Self-pay

## 2021-07-13 ENCOUNTER — Ambulatory Visit (INDEPENDENT_AMBULATORY_CARE_PROVIDER_SITE_OTHER): Payer: Medicaid Other | Admitting: Family Medicine

## 2021-07-13 VITALS — BP 119/90 | HR 72 | Wt 247.0 lb

## 2021-07-13 DIAGNOSIS — O162 Unspecified maternal hypertension, second trimester: Secondary | ICD-10-CM

## 2021-07-13 DIAGNOSIS — T7840XD Allergy, unspecified, subsequent encounter: Secondary | ICD-10-CM

## 2021-07-13 DIAGNOSIS — Z3402 Encounter for supervision of normal first pregnancy, second trimester: Secondary | ICD-10-CM

## 2021-07-13 DIAGNOSIS — O169 Unspecified maternal hypertension, unspecified trimester: Secondary | ICD-10-CM

## 2021-07-13 DIAGNOSIS — J4540 Moderate persistent asthma, uncomplicated: Secondary | ICD-10-CM

## 2021-07-13 DIAGNOSIS — Z3A23 23 weeks gestation of pregnancy: Secondary | ICD-10-CM | POA: Diagnosis not present

## 2021-07-13 DIAGNOSIS — O99512 Diseases of the respiratory system complicating pregnancy, second trimester: Secondary | ICD-10-CM

## 2021-07-13 MED ORDER — EPINEPHRINE 0.3 MG/0.3ML IJ SOAJ: 0.3000 mg | Freq: Once | INTRAMUSCULAR | 1 refills | Status: DC | PRN

## 2021-07-13 MED ORDER — FLUTICASONE PROPIONATE 50 MCG/ACT NA SUSP: NASAL | 2 refills | Status: DC

## 2021-07-13 MED ORDER — PRENATAL 27-0.8 MG PO TABS: 1.0000 | ORAL_TABLET | Freq: Every day | ORAL | 1 refills | Status: DC

## 2021-07-13 NOTE — Patient Instructions (Addendum)
Refilled medications  Start flovent for your breathing Follow up with me in a few weeks - will contact you to schedule  Come back tomorrow for nurse visit to check blood pressure Checking labs today  Go to the MAU at [facility] if: You have cramping/contractions that do not go away with drinking water Your water breaks.  Sometimes it is a big gush of fluid, sometimes it is just a trickle that keeps getting your underwear wet or running down your legs You have vaginal bleeding.    You do not feel your baby moving like normal.  If you do not, get something to eat and drink and lay down and focus on feeling your baby move. If your baby is still not moving like normal, you should go to MAU.

## 2021-07-13 NOTE — Progress Notes (Addendum)
[facility] Routine Faculty Prenatal Visit  Lisa [name] is a 26 y.o. G1P0000 at [redacted]w[redacted]d (via 6w u/s) who presents for routine follow up.   Denies cramping/ctx, fluid leaking, vaginal bleeding, or decreased fetal movement.    Reports multiple issues today: - breathing issues - feeling more shortness of breath at night, having to use albuterol frequently overnight. Feels better when using albuterol. History of asthma, not on controller medication. - back pain - some pain in low and mid thoracic back, better with stretching. Denies incontinence of bowel or bladder. - anaphylaxis - reports 2-3 weeks ago had an episode of hives and breathing issues after eating some food that may have contained fish. Known fish allergy. Took epipen, called EMS. Did not go to ED. - mood - having more outbursts lately. Recently poked herself with a thumbtack in order to relieve stress/feel pain. Did not intend to kill herself with that. Does not have plans of trying to kill herself. Has not followed up with counseling or psychiatry. Not on any psychiatric medications at this time.  Exam BP 119/90   Pulse 72   Wt 247 lb (112 kg)   LMP 01/24/2021   BMI 45.18 kg/m   FHR: 145 bpm Fundal height: 25 bpm Gen: no acute distress, pleasant cooperative Heart: regular rate and rhythm, no murmur Lungs: clear to auscultation bilaterally, normal work of breathing  Back : mild paraspinal muscle tenderness over lumbar and thoracic spine bilaterally Neuro: normal gait and speech  Assessment & Plan  1. Routine prenatal care: - continue aspirin & pnv - refilled PNV - advised using the type she is currently using and tolerating since there was an alert that suggested an issue with crossreactivity  - reviewed anatomy u/s from 6/6 - limited views, f/u u/s on 7/3 completed cardiac views, had appropriate growth and AFI. Has next growth scan scheduled 7/31  2. Elevated BP - BP noted mildly elevated at  today's visit, initially at 135/82, on recheck 119/90. Patient denies headache or blurry vision. - check PIH labs for baseline - UPC, CBC, CMET - RN visit scheduled for tomorrow AM to recheck BP - discussed return precautions, reasons to go to MAU  3. Asthma - endorsing increased albuterol use due to shortness of breath at night, which improves with using albuterol. Not on controller medication at present. 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 Seizure disorder - per mom no seizures since last visit. Has not seen neuro, appears neuro has attempted to call her to schedule - reminded mom to call to schedule  6. Maternal obesity - BMI 45. MFM doing monthly growth scans and recommend weekly fetal testing beginning at 34 weeks. - had lost weight initially in pregnancy due to n/v, now improving and has gained some weight, continue to trend.  7. Psych - MDD/GAD/pseudoseizures/PTSD/intellectual disability - having more outbursts lately. Denies active plans of wanting to end her life, does admit to recently pricking herself with a tack to feel stress relief. - encouraged follow up with [facility] for counseling - advised medications can be safe in pregnancy - this is not a reason to not take medications, encouraged follow up with psychiatry at [facility] - discussed safety planning, call always call [phone] if has thoughts of self harm. No active SI or HI at  present.  8. Guardianship - will almost certainly not be able to have custody of baby due to her own intellectual disability and history of poor decision making. Her mother is prepared to assume custody of baby. Working on getting guardianship of  - mom has been in touch with Christen Butter, clinic social worker for [facility].  - will attempt to discuss with CSW necessary steps for  guardianship  9. Fragile X premutation carrier/Family history of Fragile X syndrome - saw genetic counseling, they recommended baby see peds genetics after delivery - also recommended patient get in with [facility], genetic counselor working to facilitate this, no recent updates on that appointment   10. L nipple bloody discharge and reported mass - ultrasound was unremarkable, per patient has not recurred - if recurs or persists would need another ultrasound vs mammogram, or surgical consultation.   11.  Trichomonas in pregnancy - TOC negative on 5/9  12. ASCUS pap - given prior abnormal paps (LSIL in 2020 and 2021) needs colpo, defer this to postpartum  13. Anaphylaxis - recently used epipen due to hives, trouble breathing after possibly contacting fish. - refill epipen - advised if she needs to use epipen at any point should go to ED for monitoring and assessment afterwards  Follow up tomorrow for RN BP check. MAU precautions discussed. I would like to see her back in a few weeks to see how asthma is doing, will contact her to schedule since currently I have no appts.   Levert Feinstein, MD [facility] Faculty

## 2021-07-14 ENCOUNTER — Encounter (HOSPITAL_COMMUNITY): Payer: Self-pay | Admitting: Obstetrics & Gynecology

## 2021-07-14 ENCOUNTER — Inpatient Hospital Stay (HOSPITAL_COMMUNITY)
Admission: AD | Admit: 2021-07-14 | Discharge: 2021-07-17 | DRG: 833 | Disposition: A | Discharge status: Home / Self Care | Payer: Medicaid Other | Attending: Obstetrics & Gynecology | Admitting: Obstetrics & Gynecology

## 2021-07-14 ENCOUNTER — Ambulatory Visit (INDEPENDENT_AMBULATORY_CARE_PROVIDER_SITE_OTHER): Payer: Medicaid Other

## 2021-07-14 VITALS — BP 122/86 | HR 66

## 2021-07-14 DIAGNOSIS — Z3A23 23 weeks gestation of pregnancy: Secondary | ICD-10-CM | POA: Diagnosis not present

## 2021-07-14 DIAGNOSIS — R030 Elevated blood-pressure reading, without diagnosis of hypertension: Secondary | ICD-10-CM | POA: Diagnosis present

## 2021-07-14 DIAGNOSIS — W182XXA Fall in (into) shower or empty bathtub, initial encounter: Secondary | ICD-10-CM | POA: Diagnosis present

## 2021-07-14 DIAGNOSIS — J45909 Unspecified asthma, uncomplicated: Secondary | ICD-10-CM | POA: Diagnosis present

## 2021-07-14 DIAGNOSIS — O162 Unspecified maternal hypertension, second trimester: Secondary | ICD-10-CM | POA: Diagnosis present

## 2021-07-14 DIAGNOSIS — Z0130 Encounter for examination of blood pressure without abnormal findings: Secondary | ICD-10-CM

## 2021-07-14 DIAGNOSIS — O99891 Other specified diseases and conditions complicating pregnancy: Principal | ICD-10-CM | POA: Diagnosis present

## 2021-07-14 DIAGNOSIS — O99212 Obesity complicating pregnancy, second trimester: Secondary | ICD-10-CM | POA: Diagnosis present

## 2021-07-14 DIAGNOSIS — O26892 Other specified pregnancy related conditions, second trimester: Secondary | ICD-10-CM | POA: Diagnosis present

## 2021-07-14 DIAGNOSIS — R001 Bradycardia, unspecified: Secondary | ICD-10-CM | POA: Diagnosis not present

## 2021-07-14 DIAGNOSIS — R42 Dizziness and giddiness: Secondary | ICD-10-CM | POA: Diagnosis present

## 2021-07-14 DIAGNOSIS — O99512 Diseases of the respiratory system complicating pregnancy, second trimester: Secondary | ICD-10-CM | POA: Diagnosis present

## 2021-07-14 DIAGNOSIS — Y93E1 Activity, personal bathing and showering: Secondary | ICD-10-CM

## 2021-07-14 DIAGNOSIS — Z148 Genetic carrier of other disease: Secondary | ICD-10-CM

## 2021-07-14 DIAGNOSIS — R0602 Shortness of breath: Secondary | ICD-10-CM | POA: Diagnosis present

## 2021-07-14 DIAGNOSIS — Z7982 Long term (current) use of aspirin: Secondary | ICD-10-CM

## 2021-07-14 LAB — COMPREHENSIVE METABOLIC PANEL
ALT: 20 U/L (ref 0–44)
AST: 17 U/L (ref 15–41)
Albumin: 2.7 g/dL — ABNORMAL LOW (ref 3.5–5.0)
Alkaline Phosphatase: 56 U/L (ref 38–126)
Anion gap: 8 (ref 5–15)
BUN: <5 mg/dL — ABNORMAL LOW (ref 6–20)
CO2: 21 mmol/L — ABNORMAL LOW (ref 22–32)
Calcium: 9 mg/dL (ref 8.9–10.3)
Chloride: 108 mmol/L (ref 98–111)
Creatinine, Ser: 0.76 mg/dL (ref 0.44–1.00)
GFR, Estimated: >60 mL/min (ref >60)
Glucose, Bld: 79 mg/dL (ref 70–99)
Potassium: 3.4 mmol/L — ABNORMAL LOW (ref 3.5–5.1)
Sodium: 137 mmol/L (ref 135–145)
Total Bilirubin: 0.3 mg/dL (ref 0.3–1.2)
Total Protein: 5.9 g/dL — ABNORMAL LOW (ref 6.5–8.1)

## 2021-07-14 LAB — CBC
HCT: 33.9 % — ABNORMAL LOW (ref 36.0–46.0)
Hematocrit: 34.2 % (ref 34.0–46.6)
Hemoglobin: 11.8 g/dL (ref 11.1–15.9)
Hemoglobin: 11.9 g/dL — ABNORMAL LOW (ref 12.0–15.0)
MCH: 28.3 pg (ref 26.6–33.0)
MCH: 28.2 pg (ref 26.0–34.0)
MCHC: 34.5 g/dL (ref 31.5–35.7)
MCHC: 35.1 g/dL (ref 30.0–36.0)
MCV: 82 fL (ref 79–97)
MCV: 80.3 fL (ref 80.0–100.0)
Platelets: 186 10*3/uL (ref 150–450)
Platelets: 193 10*3/uL (ref 150–400)
RBC: 4.17 x10E6/uL (ref 3.77–5.28)
RBC: 4.22 MIL/uL (ref 3.87–5.11)
RDW: 13.6 % (ref 11.7–15.4)
RDW: 13.9 % (ref 11.5–15.5)
WBC: 5.1 10*3/uL (ref 3.4–10.8)
WBC: 5.3 10*3/uL (ref 4.0–10.5)
nRBC: 0 % (ref 0.0–0.2)

## 2021-07-14 LAB — TYPE AND SCREEN
ABO/RH(D): B POS
Antibody Screen: NEGATIVE

## 2021-07-14 LAB — CMP14+EGFR
ALT: 15 IU/L (ref 0–32)
AST: 15 IU/L (ref 0–40)
Albumin/Globulin Ratio: 1.4 (ref 1.2–2.2)
Albumin: 3.3 g/dL — ABNORMAL LOW (ref 3.9–5.0)
Alkaline Phosphatase: 67 IU/L (ref 44–121)
BUN/Creatinine Ratio: 6 — ABNORMAL LOW (ref 9–23)
BUN: 4 mg/dL — ABNORMAL LOW (ref 6–20)
Bilirubin Total: <0.2 mg/dL (ref 0.0–1.2)
CO2: 18 mmol/L — ABNORMAL LOW (ref 20–29)
Calcium: 9 mg/dL (ref 8.7–10.2)
Chloride: 107 mmol/L — ABNORMAL HIGH (ref 96–106)
Creatinine, Ser: 0.65 mg/dL (ref 0.57–1.00)
Globulin, Total: 2.4 g/dL (ref 1.5–4.5)
Glucose: 74 mg/dL (ref 70–99)
Potassium: 3.5 mmol/L (ref 3.5–5.2)
Sodium: 138 mmol/L (ref 134–144)
Total Protein: 5.7 g/dL — ABNORMAL LOW (ref 6.0–8.5)
eGFR: 125 mL/min/{1.73_m2} (ref >59)

## 2021-07-14 LAB — URINALYSIS, ROUTINE W REFLEX MICROSCOPIC
Glucose, UA: NEGATIVE mg/dL
Hgb urine dipstick: NEGATIVE
Ketones, ur: 5 mg/dL — AB
Nitrite: NEGATIVE
Protein, ur: 100 mg/dL — AB
Specific Gravity, Urine: 1.027 (ref 1.005–1.030)
pH: 5 (ref 5.0–8.0)

## 2021-07-14 LAB — TROPONIN I (HIGH SENSITIVITY): Troponin I (High Sensitivity): 6 ng/L (ref <18)

## 2021-07-14 LAB — PROTEIN / CREATININE RATIO, URINE
Creatinine, Urine: 649.7 mg/dL
Creatinine, Urine: 652.82 mg/dL
Protein Creatinine Ratio: 0.19 mg/mg{Cre} — ABNORMAL HIGH (ref 0.00–0.15)
Protein, Ur: 214.7 mg/dL
Protein/Creat Ratio: 330 mg/g creat — ABNORMAL HIGH (ref 0–200)
Total Protein, Urine: 125 mg/dL

## 2021-07-14 LAB — BRAIN NATRIURETIC PEPTIDE: B Natriuretic Peptide: 154.5 pg/mL — ABNORMAL HIGH (ref 0.0–100.0)

## 2021-07-14 LAB — TSH: TSH: 1.546 u[IU]/mL (ref 0.350–4.500)

## 2021-07-14 MED ORDER — ACETAMINOPHEN 325 MG PO TABS
650.0000 mg | ORAL_TABLET | ORAL | Status: DC | PRN
  Administered 2021-07-16: 650 mg via ORAL
  Filled 2021-07-14: qty 2

## 2021-07-14 MED ORDER — MECLIZINE HCL 25 MG PO TABS
50.0000 mg | ORAL_TABLET | Freq: Once | ORAL | Status: AC
  Administered 2021-07-14: 50 mg via ORAL
  Filled 2021-07-14: qty 2

## 2021-07-14 MED ORDER — CALCIUM CARBONATE ANTACID 500 MG PO CHEW: 2.0000 | CHEWABLE_TABLET | ORAL | Status: DC | PRN

## 2021-07-14 MED ORDER — LACTATED RINGERS IV BOLUS
1000.0000 mL | Freq: Once | INTRAVENOUS | Status: AC
Start: 2021-07-14 — End: 2021-07-14
  Administered 2021-07-14: 1000 mL via INTRAVENOUS

## 2021-07-14 MED ORDER — ZOLPIDEM TARTRATE 5 MG PO TABS: 5.0000 mg | ORAL_TABLET | Freq: Every evening | ORAL | Status: DC | PRN

## 2021-07-14 MED ORDER — PRENATAL MULTIVITAMIN CH
1.0000 | ORAL_TABLET | Freq: Every day | ORAL | Status: DC
  Administered 2021-07-15 – 2021-07-17 (×3): 1 via ORAL
  Filled 2021-07-14 (×3): qty 1

## 2021-07-14 MED ORDER — LACTATED RINGERS IV BOLUS
1000.0000 mL | Freq: Once | INTRAVENOUS | Status: AC
  Administered 2021-07-14: 1000 mL via INTRAVENOUS

## 2021-07-14 MED ORDER — LACTATED RINGERS IV BOLUS: 1000.0000 mL | Freq: Once | INTRAVENOUS | Status: DC

## 2021-07-14 MED ORDER — DOCUSATE SODIUM 100 MG PO CAPS
100.0000 mg | ORAL_CAPSULE | Freq: Every day | ORAL | Status: DC
  Administered 2021-07-15 – 2021-07-17 (×3): 100 mg via ORAL
  Filled 2021-07-14 (×3): qty 1

## 2021-07-14 MED ORDER — FLUTICASONE PROPIONATE HFA 44 MCG/ACT IN AERO
2.0000 | INHALATION_SPRAY | Freq: Two times a day (BID) | RESPIRATORY_TRACT | 3 refills | Status: DC
Start: 2021-07-14 — End: 2022-03-05

## 2021-07-14 MED ORDER — LACTATED RINGERS IV SOLN: INTRAVENOUS | Status: DC

## 2021-07-14 MED ORDER — ACETAMINOPHEN 500 MG PO TABS
1000.0000 mg | ORAL_TABLET | Freq: Once | ORAL | Status: AC
  Administered 2021-07-14: 1000 mg via ORAL
  Filled 2021-07-14: qty 2
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
## 2021-07-14 NOTE — Progress Notes (Signed)
Patient presents to nurse clinic for BP check. Patient was seen by Dr. Pollie Meyer yesterday and BP was 119/90.   BP today is 122/86 with adult large manual cuff. HR: 66 SpO2: 98% RA.   Patient states that she has been having headaches and blurry vision x 1 month.   Spoke with Dr. Pollie Meyer regarding patient. Provider went to room to discuss BP and symptoms further. Patient reported fall this morning to provider. Provider recommended MAU evaluation.   Assisted patient to wheelchair and transported to MAU.   Lisa Prude, RN

## 2021-07-14 NOTE — Progress Notes (Signed)
Patient presented today for blood pressure recheck after having mild blood pressure elevation during yesterday's routine visit.  BP today 122/86, reassuring. CMP and CBC look fine, UPC is pending still.  Notably though patient reports headache and blurry vision x1 month, reports she did not endorse these symptoms yesterday when asked because she was afraid to say anything.  Says headache is constant, not relieved by tylenol. Also endorses feeling her vision going out like she's "blacking out". Reports sister yesterday saw her fall asleep on couch mid conversation.  Also reports she fell down this AM in her bathroom, fell into sink/wall and hit her L flank and L abdomen and has ongoing pain in both of those places. Cannot describe what caused her to fall. Mom was present and accompanies her to today's visit and corroborates that patient fell today and that patient reported not feeling well.  Exam: Gen: no acute distress, appears somewhat tired especially towards end of visit Heart: regular rate and rhythm, no murmur Lungs: clear to auscultation bilaterally, normal work of breathing  Neuro: speech and gait normal. Abdomen: soft, obese. +tender to palpation over left lateral aspect of abdomen/flank FHR 144bpm  Lisa [name] is a 26 y.o. G1P0000 at [redacted]w[redacted]d. Pregnancy complicated by multiple psychiatric, social, and medical issues (history depression/PTSD, intellectual disability, fragile X premutation carrier,  obesity, asthma, prior seizure disorder not on medications, among other issues).  Unclear what to make of many of the symptoms patient reports. She typically has a host of somatic complaints reported at each visit. Her care is very challenging as I estimate she functions cognitively at about the level of a 25 year old, and may be prone to embellishing symptoms. I do not want to minimize these symptoms, for fear that she will be reluctant to share "true" symptoms moving forward. It's hard to  discern if the "blacking out" symptoms she reports are simply her falling asleep, or syncope, or perhaps even absence seizures. She does have a history of absence seizures previously but has not been on any seizure medications in quite some time. Neuro appointment is pending, waiting for mom to schedule (has been in contact with neuro office and is waiting to hear back).  Recommendations:  - Given fall with abdominal trauma today post-viability, recommend she be seen at MAU for fetal monitoring - Reports lightheadedness and generally feeling weak, may benefit from IVF. - Recommend continued monitoring of blood pressure at MAU.  - Consider EKG for evaluation of fall, possible synocope?  - Consider starting mag oxide for headache prophylaxis during pregnancy  Patient agreeable with going to MAU and was wheeled there by Glori Bickers RN. Her mother will meet them at the MAU. I called and spoke with MAU provider Judeth Horn NP to inform of patient's history and pending arrival.  Latrelle Dodrill, MD
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
## 2021-07-14 NOTE — MAU Note (Signed)
.  Lisa [name] is a 26 y.o. at [redacted]w[redacted]d here in MAU reporting: that she checked her BP this morning and she reports that "the bottom one was low, but I'm not sure about the top". She reports that she fell after getting out of the shower this morning and has since had blurry vision and a HA (8/10). Denies taking anything for her HA. She reports that her left side is still hurting from the fall and describes the pain as sharp (10/10). She reports that her chest feels tight and it makes it more difficult to breathe, she reports that is started three days ago and is constant (9/10). She says that it does sometimes get better after using her inhaler. Denies any VB or LOF. Reports feeling a normal amount of FM for her.   Onset of complaint: today  Vitals:   07/14/21 1204  BP: (!) 148/89  Pulse: (!) 51  Resp: 17  Temp: 97.9 F (36.6 C)  SpO2: 100%     FHT:150

## 2021-07-15 ENCOUNTER — Observation Stay (HOSPITAL_COMMUNITY): Payer: Medicaid Other

## 2021-07-15 DIAGNOSIS — R0609 Other forms of dyspnea: Secondary | ICD-10-CM

## 2021-07-15 DIAGNOSIS — R42 Dizziness and giddiness: Secondary | ICD-10-CM

## 2021-07-15 DIAGNOSIS — O162 Unspecified maternal hypertension, second trimester: Secondary | ICD-10-CM | POA: Diagnosis present

## 2021-07-15 DIAGNOSIS — Z3A23 23 weeks gestation of pregnancy: Secondary | ICD-10-CM | POA: Diagnosis not present

## 2021-07-15 DIAGNOSIS — Z7982 Long term (current) use of aspirin: Secondary | ICD-10-CM | POA: Diagnosis not present

## 2021-07-15 DIAGNOSIS — R001 Bradycardia, unspecified: Secondary | ICD-10-CM | POA: Diagnosis present

## 2021-07-15 DIAGNOSIS — R0602 Shortness of breath: Secondary | ICD-10-CM | POA: Diagnosis present

## 2021-07-15 DIAGNOSIS — Z148 Genetic carrier of other disease: Secondary | ICD-10-CM | POA: Diagnosis not present

## 2021-07-15 DIAGNOSIS — W182XXA Fall in (into) shower or empty bathtub, initial encounter: Secondary | ICD-10-CM | POA: Diagnosis present

## 2021-07-15 DIAGNOSIS — O26892 Other specified pregnancy related conditions, second trimester: Secondary | ICD-10-CM | POA: Diagnosis present

## 2021-07-15 DIAGNOSIS — O99212 Obesity complicating pregnancy, second trimester: Secondary | ICD-10-CM | POA: Diagnosis present

## 2021-07-15 DIAGNOSIS — R030 Elevated blood-pressure reading, without diagnosis of hypertension: Secondary | ICD-10-CM | POA: Diagnosis present

## 2021-07-15 DIAGNOSIS — O99512 Diseases of the respiratory system complicating pregnancy, second trimester: Secondary | ICD-10-CM | POA: Diagnosis present

## 2021-07-15 DIAGNOSIS — Z6841 Body Mass Index (BMI) 40.0 and over, adult: Secondary | ICD-10-CM

## 2021-07-15 DIAGNOSIS — J45909 Unspecified asthma, uncomplicated: Secondary | ICD-10-CM | POA: Diagnosis present

## 2021-07-15 DIAGNOSIS — Y93E1 Activity, personal bathing and showering: Secondary | ICD-10-CM | POA: Diagnosis not present

## 2021-07-15 DIAGNOSIS — O99891 Other specified diseases and conditions complicating pregnancy: Secondary | ICD-10-CM | POA: Diagnosis present

## 2021-07-15 LAB — COMPREHENSIVE METABOLIC PANEL
ALT: 15 U/L (ref 0–44)
AST: 14 U/L — ABNORMAL LOW (ref 15–41)
Albumin: 2.2 g/dL — ABNORMAL LOW (ref 3.5–5.0)
Alkaline Phosphatase: 52 U/L (ref 38–126)
Anion gap: 11 (ref 5–15)
BUN: <5 mg/dL — ABNORMAL LOW (ref 6–20)
CO2: 20 mmol/L — ABNORMAL LOW (ref 22–32)
Calcium: 8.7 mg/dL — ABNORMAL LOW (ref 8.9–10.3)
Chloride: 106 mmol/L (ref 98–111)
Creatinine, Ser: 0.65 mg/dL (ref 0.44–1.00)
GFR, Estimated: >60 mL/min (ref >60)
Glucose, Bld: 80 mg/dL (ref 70–99)
Potassium: 3.2 mmol/L — ABNORMAL LOW (ref 3.5–5.1)
Sodium: 137 mmol/L (ref 135–145)
Total Bilirubin: 0.2 mg/dL — ABNORMAL LOW (ref 0.3–1.2)
Total Protein: 5 g/dL — ABNORMAL LOW (ref 6.5–8.1)

## 2021-07-15 LAB — CBC
HCT: 30.9 % — ABNORMAL LOW (ref 36.0–46.0)
Hemoglobin: 11 g/dL — ABNORMAL LOW (ref 12.0–15.0)
MCH: 28.4 pg (ref 26.0–34.0)
MCHC: 35.6 g/dL (ref 30.0–36.0)
MCV: 79.8 fL — ABNORMAL LOW (ref 80.0–100.0)
Platelets: 175 10*3/uL (ref 150–400)
RBC: 3.87 MIL/uL (ref 3.87–5.11)
RDW: 14 % (ref 11.5–15.5)
WBC: 4.5 10*3/uL (ref 4.0–10.5)
nRBC: 0 % (ref 0.0–0.2)

## 2021-07-15 LAB — ECHOCARDIOGRAM COMPLETE
AR max vel: 1.85 cm2
AV Area VTI: 1.76 cm2
AV Area mean vel: 1.83 cm2
AV Mean grad: 6 mmHg
AV Peak grad: 12.3 mmHg
Ao pk vel: 1.75 m/s
Area-P 1/2: 4.21 cm2
S' Lateral: 2.9 cm

## 2021-07-15 MED ORDER — SODIUM CHLORIDE 0.9% FLUSH
3.0000 mL | Freq: Two times a day (BID) | INTRAVENOUS | Status: DC
  Administered 2021-07-15 – 2021-07-17 (×4): 3 mL via INTRAVENOUS

## 2021-07-15 MED ORDER — FUROSEMIDE 10 MG/ML IJ SOLN
20.0000 mg | Freq: Once | INTRAMUSCULAR | Status: AC
  Administered 2021-07-15: 20 mg via INTRAVENOUS
  Filled 2021-07-15: qty 2

## 2021-07-15 MED ORDER — IPRATROPIUM-ALBUTEROL 0.5-2.5 (3) MG/3ML IN SOLN
3.0000 mL | Freq: Four times a day (QID) | RESPIRATORY_TRACT | Status: DC
  Administered 2021-07-16 (×3): 3 mL via RESPIRATORY_TRACT
  Filled 2021-07-15 (×8): qty 3

## 2021-07-15 MED ORDER — POTASSIUM CHLORIDE CRYS ER 20 MEQ PO TBCR
40.0000 meq | EXTENDED_RELEASE_TABLET | ORAL | Status: AC
  Administered 2021-07-15 (×2): 40 meq via ORAL
  Filled 2021-07-15 (×2): qty 2

## 2021-07-15 NOTE — Progress Notes (Signed)
Patient ID: Lisa [name], female   DOB: [birth year], 26 y.o.   MRN: [id number] FACULTY PRACTICE ANTEPARTUM(COMPREHENSIVE) NOTE  Lisa [name] is a 26 y.o. G1P0000 at [redacted]w[redacted]d by best clinical estimate who is admitted for symptomatic bradycardia.   Fetal presentation is cephalic. Length of Stay:  0  Days  ASSESSMENT: Principal Problem:   Symptomatic bradycardia Active Problems:   Morbid obesity with body mass index of 45.0-49.9 in adult (HCC)   Elevated blood pressure affecting pregnancy in second trimester, antepartum   Shortness of breath   PLAN: Symptomatic bradycardia Patient reports not feeling much better. On telemetry and pulse has been in the 50s most of the night. Normal troponins Cardiology consult this morning Echo today  Elevated blood pressure affecting pregnancy in second trimester, antepartum Consistently elevated blood pressures during this hospitalization with no prior ones.  Labs are essentially normal. We will continue to follow  Shortness of breath This is a new report today and per mom she has issues with gasping in the night that might be consistent with sleep apnea.  That she has been using her inhaler more than usual at home. Chest x-ray today Mild elevation in BNP, consider 1 dose of Lasix Likely needs evaluation for sleep apnea as an outpatient.  Morbid obesity with body mass index of 45.0-49.9 in adult [facility]) Certainly because patient at increased risk for sleep apnea and other consequences of pregnancy.   Subjective: Patient is quite somnolent this morning and difficult to arouse.  Per mom she had increasing shortness of breath overnight. Patient reports the fetal movement as active. Patient reports uterine contraction  activity as none. Patient reports  vaginal bleeding as none. Patient describes fluid per vagina as None.  Vitals:  Blood pressure (!) 143/78, pulse (!) 55, temperature 98.5 F (36.9 C), temperature source Oral, resp. rate 18,  height 5\' 2"  (1.575 m), weight 112.3 kg, last menstrual period 01/24/2021, SpO2 100 %. Physical Examination:  General appearance - alert, well appearing, and in no distress Chest - normal effort Abdomen - gravid, non-tender Fundal Height:  size equals dates Extremities: Homans sign is negative, no sign of DVT  Membranes:intact  Fetal Monitoring:  Baseline: 150 bpm, Variability: Good {> 6 bpm), Accelerations: Reactive, and Decelerations: Absent  Labs:  Results for orders placed or performed during the hospital encounter of 07/14/21 (from the past 24 hour(s))  Protein / creatinine ratio, urine   Collection Time: 07/14/21 11:55 AM  Result Value Ref Range   Creatinine, Urine 652.82 mg/dL   Total Protein, Urine 125 mg/dL   Protein Creatinine Ratio 0.19 (H) 0.00 - 0.15 mg/mg[Cre]  Urinalysis, Routine w reflex microscopic Urine, Clean Catch   Collection Time: 07/14/21 11:55 AM  Result Value Ref Range   Color, Urine AMBER (A) YELLOW   APPearance CLOUDY (A) CLEAR   Specific Gravity, Urine 1.027 1.005 - 1.030   pH 5.0 5.0 - 8.0   Glucose, UA NEGATIVE NEGATIVE mg/dL   Hgb urine dipstick NEGATIVE NEGATIVE   Bilirubin Urine SMALL (A) NEGATIVE   Ketones, ur 5 (A) NEGATIVE mg/dL   Protein, ur 09/14/21 (A) NEGATIVE mg/dL   Nitrite NEGATIVE NEGATIVE   Leukocytes,Ua TRACE (A) NEGATIVE   RBC / HPF 6-10 0 - 5 RBC/hpf   WBC, UA 11-20 0 - 5 WBC/hpf   Bacteria, UA RARE (A) NONE SEEN   Squamous Epithelial / LPF 21-50 0 - 5   Mucus PRESENT    Amorphous Crystal PRESENT   CBC  Collection Time: 07/14/21 12:39 PM  Result Value Ref Range   WBC 5.3 4.0 - 10.5 K/uL   RBC 4.22 3.87 - 5.11 MIL/uL   Hemoglobin 11.9 (L) 12.0 - 15.0 g/dL   HCT 55.7 (L) 32.2 - 02.5 %   MCV 80.3 80.0 - 100.0 fL   MCH 28.2 26.0 - 34.0 pg   MCHC 35.1 30.0 - 36.0 g/dL   RDW 42.7 06.2 - 37.6 %   Platelets 193 150 - 400 K/uL   nRBC 0.0 0.0 - 0.2 %  Comprehensive metabolic panel   Collection Time: 07/14/21 12:39 PM  Result  Value Ref Range   Sodium 137 135 - 145 mmol/L   Potassium 3.4 (L) 3.5 - 5.1 mmol/L   Chloride 108 98 - 111 mmol/L   CO2 21 (L) 22 - 32 mmol/L   Glucose, Bld 79 70 - 99 mg/dL   BUN <5 (L) 6 - 20 mg/dL   Creatinine, Ser 2.83 0.44 - 1.00 mg/dL   Calcium 9.0 8.9 - 15.1 mg/dL   Total Protein 5.9 (L) 6.5 - 8.1 g/dL   Albumin 2.7 (L) 3.5 - 5.0 g/dL   AST 17 15 - 41 U/L   ALT 20 0 - 44 U/L   Alkaline Phosphatase 56 38 - 126 U/L   Total Bilirubin 0.3 0.3 - 1.2 mg/dL   GFR, Estimated >76 >16 mL/min   Anion gap 8 5 - 15  Troponin I (High Sensitivity)   Collection Time: 07/14/21 12:39 PM  Result Value Ref Range   Troponin I (High Sensitivity) 6 <18 ng/L  Brain natriuretic peptide   Collection Time: 07/14/21  6:09 PM  Result Value Ref Range   B Natriuretic Peptide 154.5 (H) 0.0 - 100.0 pg/mL  TSH   Collection Time: 07/14/21  6:09 PM  Result Value Ref Range   TSH 1.546 0.350 - 4.500 uIU/mL  Type and screen [facility]   Collection Time: 07/14/21  7:09 PM  Result Value Ref Range   ABO/RH(D) B POS    Antibody Screen NEG    Sample Expiration      07/17/2021,2359 Performed at [facility], [address], [place], [place] [address]   Comprehensive metabolic panel   Collection Time: 07/15/21  4:14 AM  Result Value Ref Range   Sodium 137 135 - 145 mmol/L   Potassium 3.2 (L) 3.5 - 5.1 mmol/L   Chloride 106 98 - 111 mmol/L   CO2 20 (L) 22 - 32 mmol/L   Glucose, Bld 80 70 - 99 mg/dL   BUN <5 (L) 6 - 20 mg/dL   Creatinine, Ser 0.62 0.44 - 1.00 mg/dL   Calcium 8.7 (L) 8.9 - 10.3 mg/dL   Total Protein 5.0 (L) 6.5 - 8.1 g/dL   Albumin 2.2 (L) 3.5 - 5.0 g/dL   AST 14 (L) 15 - 41 U/L   ALT 15 0 - 44 U/L   Alkaline Phosphatase 52 38 - 126 U/L   Total Bilirubin 0.2 (L) 0.3 - 1.2 mg/dL   GFR, Estimated >69 >48 mL/min   Anion gap 11 5 - 15  CBC   Collection Time: 07/15/21  4:14 AM  Result Value Ref Range   WBC 4.5 4.0 - 10.5 K/uL   RBC 3.87 3.87 - 5.11 MIL/uL    Hemoglobin 11.0 (L) 12.0 - 15.0 g/dL   HCT 54.6 (L) 27.0 - 35.0 %   MCV 79.8 (L) 80.0 - 100.0 fL   MCH 28.4  26.0 - 34.0 pg   MCHC 35.6 30.0 - 36.0 g/dL   RDW 35.5 73.2 - 20.2 %   Platelets 175 150 - 400 K/uL   nRBC 0.0 0.0 - 0.2 %    Imaging Studies:    Ultrasound from 07/10/2021  This patient was seen for a follow up exam due to maternal  obesity with a BMI of 46 and as 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  She has declined an amniocentesis for  prenatal diagnosis.  She denies any problems since her last  exam.  She was informed that the fetal growth and amniotic fluid  level appears appropriate for her gestational age.  The fetal cardiac views were visualized today.  There were no  obvious cardiac anomalies noted.  The limitations of  ultrasound in the detection of all anomalies was discussed.  Due to maternal obesity, a follow-up exam was scheduled in  4 weeks Medications:  Scheduled  docusate sodium  100 mg Oral Daily   prenatal multivitamin  1 tablet Oral Q1200   I have reviewed the patient's current medications.   Reva Bores, MD 07/15/2021,7:42 AM
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
## 2021-07-15 NOTE — Progress Notes (Signed)
  Echocardiogram 2D Echocardiogram has been performed.  Roosvelt Maser F 07/15/2021, 8:59 AM

## 2021-07-16 ENCOUNTER — Other Ambulatory Visit: Payer: Self-pay

## 2021-07-16 DIAGNOSIS — Z3A23 23 weeks gestation of pregnancy: Secondary | ICD-10-CM | POA: Diagnosis not present

## 2021-07-16 DIAGNOSIS — R0602 Shortness of breath: Secondary | ICD-10-CM | POA: Diagnosis not present

## 2021-07-16 DIAGNOSIS — O162 Unspecified maternal hypertension, second trimester: Secondary | ICD-10-CM | POA: Diagnosis not present

## 2021-07-16 DIAGNOSIS — R001 Bradycardia, unspecified: Secondary | ICD-10-CM | POA: Diagnosis not present

## 2021-07-16 LAB — BASIC METABOLIC PANEL
Anion gap: 7 (ref 5–15)
BUN: <5 mg/dL — ABNORMAL LOW (ref 6–20)
CO2: 22 mmol/L (ref 22–32)
Calcium: 8.7 mg/dL — ABNORMAL LOW (ref 8.9–10.3)
Chloride: 109 mmol/L (ref 98–111)
Creatinine, Ser: 0.69 mg/dL (ref 0.44–1.00)
GFR, Estimated: >60 mL/min (ref >60)
Glucose, Bld: 84 mg/dL (ref 70–99)
Potassium: 3.7 mmol/L (ref 3.5–5.1)
Sodium: 138 mmol/L (ref 135–145)

## 2021-07-16 MED ORDER — PREDNISONE 20 MG PO TABS
40.0000 mg | ORAL_TABLET | Freq: Every day | ORAL | Status: AC
  Administered 2021-07-16: 40 mg via ORAL
  Filled 2021-07-16: qty 2

## 2021-07-16 NOTE — Progress Notes (Signed)
[facility]  PROGRESS NOTE  Lisa [name] KCL:[id number] DOB: 12/23/1995 DOA: 07/14/2021 PCP: Latrelle Dodrill, MD   Brief HPI:    26 y.o. female, G1 P0000, at 68 w4d came to hospital with chief complaints of shortness of breath on exertion.  Patient also stated that she feels dizzy upon standing.  She has been using her inhalers for possible chest tightness.  She also has trouble laying flat while sleeping.  Usually sleeps on her side.  Denies coughing up any phlegm.  Denies fever or chills.  Denies coughing up any blood. She also has a history of asthma. She also was found to have elevated blood pressure.  No previous history of hypertension.  Echocardiogram was obtained today which showed EF of 60 to 65%, left ventricular systolic parameters were normal.    Subjective   Patient seen and examined, breathing little better this morning.   Assessment/Plan:    Dyspnea on exertion -Likely from asthma exacerbation -Improved with DuoNeb nebulizers every 6 hours -We will start prednisone taper, give 40 mg prednisone today and taper over the next 4 days -Chest x-ray showed cardiomegaly; BNP was 154 -Echocardiogram showed normal EF, left ventricular diastolic parameters were normal -Patient was given Lasix 20 mg IV x1 yesterday with adequate diuresis -We will continue with DuoNeb nebulizers for 1 more day  Bradycardia -Patient's heart rate has been in 36s -Discussed with cardiology Dr. Rennis Golden; he reviewed the EKG -No intervention recommended at this time, patient is asymptomatic -She can follow-up with women[facility] cardiology clinic as outpatient -Cardiology will call to set up an appointment  Dizziness -Improved -Orthostatic vital signs are pending  Elevated blood pressure in the setting of pregnancy -Blood pressure has been mildly elevated -Management per OB  Hypokalemia -Replete       Medications     docusate sodium  100 mg Oral Daily   ipratropium-albuterol  3  mL Nebulization Q6H   predniSONE  40 mg Oral Q breakfast   prenatal multivitamin  1 tablet Oral Q1200   sodium chloride flush  3 mL Intravenous Q12H     Data Reviewed:   CBG:  No results for input(s): "GLUCAP" in the last 168 hours.  SpO2: 100 %    Vitals:   07/16/21 0603 07/16/21 0914 07/16/21 0915 07/16/21 0920  BP: (!) 145/52     Pulse: (!) 54     Resp: 18     Temp: 98.3 F (36.8 C)     TempSrc: Oral     SpO2: 98% 100% 100% 100%  Weight:      Height:          Data Reviewed:  Basic Metabolic Panel: Recent Labs  Lab 07/13/21 1244 07/14/21 1239 07/15/21 0414 07/16/21 0737  NA 138 137 137 138  K 3.5 3.4* 3.2* 3.7  CL 107* 108 106 109  CO2 18* 21* 20* 22  GLUCOSE 74 79 80 84  BUN 4* <5* <5* <5*  CREATININE 0.65 0.76 0.65 0.69  CALCIUM 9.0 9.0 8.7* 8.7*    CBC: Recent Labs  Lab 07/13/21 1244 07/14/21 1239 07/15/21 0414  WBC 5.1 5.3 4.5  HGB 11.8 11.9* 11.0*  HCT 34.2 33.9* 30.9*  MCV 82 80.3 79.8*  PLT 186 193 175    LFT Recent Labs  Lab 07/13/21 1244 07/14/21 1239 07/15/21 0414  AST 15 17 14*  ALT 15 20 15   ALKPHOS 67 56 52  BILITOT <0.2 0.3 0.2*  PROT 5.7* 5.9* 5.0*  ALBUMIN 3.3* 2.7* 2.2*     Antibiotics: Anti-infectives (From admission, onward)    None           Objective    Physical Examination:   General-appears in no acute distress Heart-S1-S2, regular, no murmur auscultated Lungs-decreased breath sounds at lung bases Abdomen-soft, nontender, no organomegaly Extremities-no edema in the lower extremities Neuro-alert, oriented x3, no focal deficit noted  Status is: Inpatient:        Meredeth Ide   [facility] If 7PM-7AM, please contact night-coverage at [email or web address], Office  [phone]   07/16/2021, 9:45 AM  LOS: 1 day

## 2021-07-16 NOTE — Progress Notes (Signed)
Patient ID: Lisa [name], female   DOB: [birth year], 26 y.o.   MRN: [id number] FACULTY PRACTICE ANTEPARTUM(COMPREHENSIVE) NOTE  Lisa [name] is a 26 y.o. G1P0000 at [redacted]w[redacted]d by best clinical estimate who is admitted for symptomatic bradycardia.   Fetal presentation is cephalic. Length of Stay:  1  Days  ASSESSMENT: Principal Problem:   Symptomatic bradycardia Active Problems:   Morbid obesity with body mass index of 45.0-49.9 in adult (HCC)   Elevated blood pressure affecting pregnancy in second trimester, antepartum   Shortness of breath   Bradycardia   PLAN: Symptomatic bradycardia Patient reports feeling a little better, able to ambulate to bathroom with some assistance. On telemetry and pulse has been in the 50s most of the night. Normal troponin Normal ECHO Followed by [facility], will follow recommendations  Elevated blood pressure affecting pregnancy in second trimester, antepartum Consistently elevated systolic blood pressures during this hospitalization with no prior ones.  Labs are essentially normal. Concerning for possible GHTN. Negative preeclamspia labs, no severe features.  We will continue to follow  Shortness of breath This is a new report today and per mom she has issues with gasping in the night that might be consistent with sleep apnea.  That she has been using her inhaler more than usual at home. Chest x-ray  on 7/10 showed mild cardiomegaly and she had mild elevation in BNP, she is s/p one dose Lasix 20 mg IV Likely needs evaluation for sleep apnea as an outpatient. Also had history of reactive airway disease, Duoneb ordered.   Will follow [facility] recommendations.  Morbid obesity with body mass index of 45.0-49.9 in adult [facility]) Certainly because patient at increased risk for sleep apnea and other consequences of pregnancy.   Subjective: Patient is much better this morning. Awake and oriented, reports walking to bathroom but has to hold onto walls.  Per mom she is  doing better. Patient reports the fetal movement as active. Patient reports uterine contraction  activity as none. Patient reports  vaginal bleeding as none. Patient describes fluid per vagina as none.  Vitals:  Blood pressure (!) 145/52, pulse (!) 54, temperature 98.3 F (36.8 C), temperature source Oral, resp. rate 18, height 5\' 2"  (1.575 m), weight 112.3 kg, last menstrual period 01/24/2021, SpO2 98 %. Physical Examination: General appearance - alert, well appearing, and in no distress Chest - normal effort Abdomen - gravid, non-tender Fundal Height:  size equals dates Extremities: Homans sign is negative, no sign of DVT  Membranes:intact  Fetal Monitoring:  Baseline: 150 bpm, Variability: Good {> 6 bpm), Accelerations: Reactive, and Decelerations: Absent  Labs:  Results for orders placed or performed during the hospital encounter of 07/14/21 (from the past 24 hour(s))  Basic metabolic panel   Collection Time: 07/16/21  7:37 AM  Result Value Ref Range   Sodium 138 135 - 145 mmol/L   Potassium 3.7 3.5 - 5.1 mmol/L   Chloride 109 98 - 111 mmol/L   CO2 22 22 - 32 mmol/L   Glucose, Bld 84 70 - 99 mg/dL   BUN <5 (L) 6 - 20 mg/dL   Creatinine, Ser 09/16/21 0.44 - 1.00 mg/dL   Calcium 8.7 (L) 8.9 - 10.3 mg/dL   GFR, Estimated 3.55 >73 mL/min   Anion gap 7 5 - 15    Imaging Studies:    Ultrasound from 07/10/2021  This patient was seen for a follow up exam due to maternal  obesity with a BMI of 46 and as 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  She has declined an amniocentesis for  prenatal diagnosis.  She denies any problems since her last  exam.  She was informed that the fetal growth and amniotic fluid  level appears appropriate for her gestational age.  The fetal cardiac views were visualized today.  There were no  obvious cardiac anomalies noted.  The limitations of  ultrasound in the detection of all anomalies was discussed.  Due to maternal obesity, a follow-up exam  was scheduled in  4 weeks Medications:  Scheduled  docusate sodium  100 mg Oral Daily   ipratropium-albuterol  3 mL Nebulization Q6H   predniSONE  40 mg Oral Q breakfast   prenatal multivitamin  1 tablet Oral Q1200   sodium chloride flush  3 mL Intravenous Q12H   I have reviewed the patient's current medications.   Jaynie Collins, MD 07/16/2021,9:18 AM

## 2021-07-17 DIAGNOSIS — R001 Bradycardia, unspecified: Secondary | ICD-10-CM | POA: Diagnosis not present

## 2021-07-17 DIAGNOSIS — O162 Unspecified maternal hypertension, second trimester: Secondary | ICD-10-CM | POA: Diagnosis not present

## 2021-07-17 DIAGNOSIS — R0602 Shortness of breath: Secondary | ICD-10-CM | POA: Diagnosis not present

## 2021-07-17 MED ORDER — ALBUTEROL SULFATE HFA 108 (90 BASE) MCG/ACT IN AERS: 2.0000 | INHALATION_SPRAY | RESPIRATORY_TRACT | 3 refills | Status: DC | PRN

## 2021-07-17 MED ORDER — LABETALOL HCL 5 MG/ML IV SOLN: 40.0000 mg | INTRAVENOUS | Status: DC | PRN

## 2021-07-17 MED ORDER — PREDNISONE 20 MG PO TABS
30.0000 mg | ORAL_TABLET | Freq: Once | ORAL | Status: AC
Start: 2021-07-17 — End: 2021-07-17
  Administered 2021-07-17: 30 mg via ORAL
  Filled 2021-07-17: qty 2

## 2021-07-17 MED ORDER — NIFEDIPINE ER OSMOTIC RELEASE 30 MG PO TB24
30.0000 mg | ORAL_TABLET | Freq: Every day | ORAL | Status: DC
  Administered 2021-07-17: 30 mg via ORAL
  Filled 2021-07-17: qty 1

## 2021-07-17 MED ORDER — LABETALOL HCL 5 MG/ML IV SOLN: 20.0000 mg | INTRAVENOUS | Status: DC | PRN

## 2021-07-17 MED ORDER — HYDRALAZINE HCL 20 MG/ML IJ SOLN
5.0000 mg | INTRAMUSCULAR | Status: DC | PRN
Start: 2021-07-17 — End: 2021-07-17
  Administered 2021-07-17: 5 mg via INTRAVENOUS
  Filled 2021-07-17: qty 1

## 2021-07-17 MED ORDER — HYDRALAZINE HCL 20 MG/ML IJ SOLN
10.0000 mg | INTRAMUSCULAR | Status: DC | PRN
Start: 2021-07-17 — End: 2021-07-17

## 2021-07-17 MED ORDER — PREDNISONE 20 MG PO TABS
20.0000 mg | ORAL_TABLET | Freq: Every day | ORAL | Status: DC
Start: 2021-07-18 — End: 2021-07-17

## 2021-07-17 MED ORDER — PREDNISONE 10 MG PO TABS: ORAL_TABLET | ORAL | 0 refills | Status: DC

## 2021-07-17 MED ORDER — NIFEDIPINE ER 30 MG PO TB24: 30.0000 mg | ORAL_TABLET | Freq: Every day | ORAL | 5 refills | Status: DC

## 2021-07-17 MED ORDER — ALBUTEROL SULFATE HFA 108 (90 BASE) MCG/ACT IN AERS: 2.0000 | INHALATION_SPRAY | RESPIRATORY_TRACT | Status: DC | PRN

## 2021-07-17 NOTE — Plan of Care (Signed)
  Problem: Education: ?Goal: Knowledge of General Education information will improve ?Description: Including pain rating scale, medication(s)/side effects and non-pharmacologic comfort measures ?Outcome: Completed/Met ?  ?Problem: Health Behavior/Discharge Planning: ?Goal: Ability to manage health-related needs will improve ?Outcome: Completed/Met ?  ?Problem: Clinical Measurements: ?Goal: Ability to maintain clinical measurements within normal limits will improve ?Outcome: Completed/Met ?Goal: Will remain free from infection ?Outcome: Completed/Met ?Goal: Diagnostic test results will improve ?Outcome: Completed/Met ?Goal: Respiratory complications will improve ?Outcome: Completed/Met ?Goal: Cardiovascular complication will be avoided ?Outcome: Completed/Met ?  ?Problem: Activity: ?Goal: Risk for activity intolerance will decrease ?Outcome: Completed/Met ?  ?Problem: Nutrition: ?Goal: Adequate nutrition will be maintained ?Outcome: Completed/Met ?  ?Problem: Coping: ?Goal: Level of anxiety will decrease ?Outcome: Completed/Met ?  ?Problem: Elimination: ?Goal: Will not experience complications related to bowel motility ?Outcome: Completed/Met ?Goal: Will not experience complications related to urinary retention ?Outcome: Completed/Met ?  ?Problem: Pain Managment: ?Goal: General experience of comfort will improve ?Outcome: Completed/Met ?  ?Problem: Safety: ?Goal: Ability to remain free from injury will improve ?Outcome: Completed/Met ?  ?Problem: Skin Integrity: ?Goal: Risk for impaired skin integrity will decrease ?Outcome: Completed/Met ?  ?Problem: Education: ?Goal: Knowledge of disease or condition will improve ?Outcome: Completed/Met ?Goal: Knowledge of the prescribed therapeutic regimen will improve ?Outcome: Completed/Met ?Goal: Individualized Educational Video(s) ?Outcome: Completed/Met ?  ?Problem: Clinical Measurements: ?Goal: Complications related to the disease process, condition or treatment will be  avoided or minimized ?Outcome: Completed/Met ?  ?

## 2021-07-17 NOTE — Progress Notes (Signed)
[facility]  PROGRESS NOTE  Lisa [name] DSK:[id number] DOB: [birth year] DOA: 07/14/2021 PCP: Latrelle Dodrill, MD   Brief HPI:    26 y.o. female, G1 P0000, at 3 w4d came to hospital with chief complaints of shortness of breath on exertion.  Patient also stated that she feels dizzy upon standing.  She has been using her inhalers for possible chest tightness.  She also has trouble laying flat while sleeping.  Usually sleeps on her side.  Denies coughing up any phlegm.  Denies fever or chills.  Denies coughing up any blood. She also has a history of asthma. She also was found to have elevated blood pressure.  No previous history of hypertension.  Echocardiogram was obtained today which showed EF of 60 to 65%, left ventricular systolic parameters were normal.    Subjective   Patient seen breathing has significantly improved.   Assessment/Plan:    Dyspnea on exertion -Likely from asthma exacerbation -Improved with DuoNeb nebulizers every 6 hours -Prednisone 40 mg p.o. given yesterday; will do prednisone 30 mg today and this can be tapered off in the next 3 days Patient can be discharged on controlled inhaler as needed for shortness of breath -Consider antihistamines as outpatient   Questionable cardiomegaly -Seen on chest x-ray, BNP was 154 -Echocardiogram showed normal EF, left ventricular diastolic parameters were normal -Patient was given Lasix 20 mg IV x1 with adequate diuresis -As echocardiogram is normal, no further intervention recommended.  Bradycardia -Patient's heart rate has been in 50s-60's -Discussed with cardiology Dr. Rennis Golden; he reviewed the EKG -No intervention recommended at this time, patient is asymptomatic -She can follow-up with women's cardiology clinic as outpatient -Cardiology will call to set up an appointment  Dizziness -Improved -Orthostatic vital signs are normal  Elevated blood pressure in the setting of pregnancy -Blood pressure has  been mildly elevated -Management per OB  Hypokalemia -Replete   [facility] service will sign off Call us again if needed    Medications     docusate sodium  100 mg Oral Daily   predniSONE  30 mg Oral Once   prenatal multivitamin  1 tablet Oral Q1200   sodium chloride flush  3 mL Intravenous Q12H     Data Reviewed:   CBG:  No results for input(s): "GLUCAP" in the last 168 hours.  SpO2: 100 %    Vitals:   07/16/21 2004 07/16/21 2010 07/16/21 2253 07/17/21 0349  BP:  (!) 147/85 133/76 136/82  Pulse:  63 64 63  Resp:  20 20 16   Temp:   97.9 F (36.6 C) 98 F (36.7 C)  TempSrc:   Oral Oral  SpO2: 92% 100% 100% 100%  Weight:      Height:          Data Reviewed:  Basic Metabolic Panel: Recent Labs  Lab 07/13/21 1244 07/14/21 1239 07/15/21 0414 07/16/21 0737  NA 138 137 137 138  K 3.5 3.4* 3.2* 3.7  CL 107* 108 106 109  CO2 18* 21* 20* 22  GLUCOSE 74 79 80 84  BUN 4* <5* <5* <5*  CREATININE 0.65 0.76 0.65 0.69  CALCIUM 9.0 9.0 8.7* 8.7*    CBC: Recent Labs  Lab 07/13/21 1244 07/14/21 1239 07/15/21 0414  WBC 5.1 5.3 4.5  HGB 11.8 11.9* 11.0*  HCT 34.2 33.9* 30.9*  MCV 82 80.3 79.8*  PLT 186 193 175    LFT Recent Labs  Lab 07/13/21 1244 07/14/21 1239 07/15/21 0414  AST 15  17 14*  ALT 15 20 15   ALKPHOS 67 56 52  BILITOT <0.2 0.3 0.2*  PROT 5.7* 5.9* 5.0*  ALBUMIN 3.3* 2.7* 2.2*     Antibiotics: Anti-infectives (From admission, onward)    None           Objective    Physical Examination:   General-appears in no acute distress Heart-S1-S2, regular, no murmur auscultated Lungs-clear to auscultation bilaterally, no wheezing or crackles auscultated Abdomen-soft, nontender, no organomegaly Extremities-no edema in the lower extremities Neuro-alert, oriented x3, no focal deficit noted neuro-  Status is: Inpatient:          [facility] If 7PM-7AM, please contact night-coverage at  [email or web address], Office  [phone]   07/17/2021, 7:43 AM  LOS: 2 days

## 2021-07-17 NOTE — Discharge Summary (Signed)
Physician Discharge Summary  Patient ID: Lisa [name] MRN: [id number] DOB/AGE: [birth year] 26 y.o.  Admit date: 07/14/2021 Discharge date: 07/17/2021  Admission Diagnoses: Symptomatic bradycardia  Discharge Diagnoses:  Principal Problem:   Symptomatic bradycardia Active Problems:   Morbid obesity with body mass index of 45.0-49.9 in adult [facility])   Elevated blood pressure affecting pregnancy in second trimester, antepartum   Shortness of breath   Bradycardia   Discharged Condition: good  Hospital Course: Patient admitted with symptomatic bradycardia and shortness of breath. Patient had a negative cardiac evaluation with normal echo, troponin and EKG demonstrating bradycardia in the 50's-60's. Her shortness of breath was likely related to reactive airway disease and she experienced significant improvement with inhaler dosing change and a course of prednisone. Patient was found to be stable for discharge with plans for outpatient follow up with cardiology. Patient was noted to have elevated blood pressure on day of discharge in the severe range and responded well to 5 mg IV hydralazine. She was started on procardia 30 mg XL which did not cause significant hypotension and helped improve her pulse. Patient requested to go home with plans for BP check later this week. Reviewed si/sx of preeclampsia  Consults:  [facility]    Discharge Exam: Blood pressure (!) 142/94, pulse 87, temperature 98.1 F (36.7 C), temperature source Oral, resp. rate 16, height 5\' 2"  (1.575 m), weight 112.3 kg, last menstrual period 01/24/2021, SpO2 99 %. GENERAL: Well-developed, well-nourished female in no acute distress.  LUNGS: Clear to auscultation bilaterally.  HEART: Regular rate and rhythm. ABDOMEN: Soft, nontender, gravid PELVIC: Not indicated EXTREMITIES: No cyanosis, clubbing, or edema, 2+ distal pulses.   Disposition: Home Discharge disposition: 01-Home or Self Care        Allergies as of  07/17/2021       Reactions   Contrast Media [iodinated Contrast Media] Hives, Shortness Of Breath   Fish Allergy Anaphylaxis   anaphylaxis   Percocet [oxycodone-acetaminophen] Anaphylaxis   Pt states she can take plain acetaminophen   Amoxicillin Hives   Rash, sob   Atarax [hydroxyzine] Other (See Comments)   Visual hallucinations        Medication List     TAKE these medications    albuterol 108 (90 Base) MCG/ACT inhaler Commonly known as: VENTOLIN HFA Inhale 2 puffs into the lungs every 4 (four) hours as needed for wheezing or shortness of breath. What changed: when to take this   aspirin EC 81 MG tablet Take 1 tablet (81 mg total) by mouth daily. Swallow whole.   EPINEPHrine 0.3 mg/0.3 mL Soaj injection Commonly known as: EPI-PEN Inject 0.3 mg into the muscle once as needed for up to 1 dose.   fluticasone 44 MCG/ACT inhaler Commonly known as: Flovent HFA Inhale 2 puffs into the lungs in the morning and at bedtime.   fluticasone 50 MCG/ACT nasal spray Commonly known as: FLONASE PLACE 1 SPRAY INTO BOTH NOSTRILS DAILY.   multivitamin-prenatal 27-0.8 MG Tabs tablet Take 1 tablet by mouth daily at 12 noon.   NIFEdipine 30 MG 24 hr tablet Commonly known as: ADALAT CC Take 1 tablet (30 mg total) by mouth daily. Start taking on: July 18, 2021   predniSONE 10 MG tablet Commonly known as: DELTASONE Take 2 tablets on 07/18/21 and 1 tablet on 07/19/21 Start taking on: July 18, 2021        Follow-up Information     [facility] for July 20, 2021 [facility] Follow up.  Specialty: Obstetrics and Gynecology Why: An appointment will be scheduled for blood pressure check this week Contact information: [address] [place] [address] [phone]                Signed: Catalina Antigua 07/17/2021, 3:50 PM

## 2021-07-18 ENCOUNTER — Other Ambulatory Visit: Payer: Self-pay | Admitting: *Deleted

## 2021-07-18 NOTE — Patient Outreach (Signed)
  Care Coordination [facility] Note Transition Care Management Follow-up Telephone Call Date of discharge and from where: 07/17/21 [facility] How have you been since you were released from the hospital?  Patient and mother explain that the patient had an episode yesterday after discharge where she became dizzy and almost fell. She continues to feel weak, dizzy at times, and short of breath. Reports that her feet and legs are mores swollen since her discharge.  Any questions or concerns? Yes, as above and patient does not have a B/P monitor to check her B/P. Nurse advised patient/mother to contact PCP,  inform PCP about the ongoing symptoms, and to be seen today by her PCP or go back to the ED today to be evaluated. Mother verbalized understanding.  Items Reviewed: Did the pt receive and understand the discharge instructions provided? Yes  Medications obtained and verified? Yes  Other? No  Any new allergies since your discharge? No  Dietary orders reviewed? No Do you have support at home? Yes   Home Care and Equipment/Supplies: Were home health services ordered? no If so, what is the name of the agency? N/A  Has the agency set up a time to come to the patient's home? not applicable Were any new equipment or medical supplies ordered?  No What is the name of the medical supply agency? N/A Were you able to get the supplies/equipment? not applicable Do you have any questions related to the use of the equipment or supplies? No  Functional Questionnaire: (I = Independent and D = Dependent) ADLs: I  Bathing/Dressing- I  Meal Prep- mother assist  Eating- I  Maintaining continence- I  Transferring/Ambulation- I instructed to stand slowly and wait a few seconds to see if she becomes dizzy before beginning to  walk. Instructed to avoid the heat due to dizziness and shortness of breath  Managing Meds- I  Follow up appointments reviewed:  PCP Hospital f/u appt confirmed? No   Specialist  Hospital f/u appt confirmed? Yes  Scheduled to see [facility] on 07/20/21 @ 10:00 and 08/07/21 @2 :15; Cardiology Dr. on 08/25/21 @ 2:00 Are transportation arrangements needed? No  If their condition worsens, is the pt aware to call PCP or go to the Emergency Dept.? Yes Was the patient provided with contact information for the PCP's office or ED? Yes Was to pt encouraged to call back with questions or concerns? Yes  SDOH assessments and interventions completed:   No  Care Coordination Interventions Activated:  No Care Coordination Interventions:   N/A  Encounter Outcome:  Pt. Visit Completed

## 2021-07-20 ENCOUNTER — Ambulatory Visit (INDEPENDENT_AMBULATORY_CARE_PROVIDER_SITE_OTHER): Payer: Medicaid Other

## 2021-07-20 ENCOUNTER — Other Ambulatory Visit: Payer: Self-pay

## 2021-07-20 VITALS — BP 151/76 | HR 50 | Wt 259.5 lb

## 2021-07-20 DIAGNOSIS — Z0130 Encounter for examination of blood pressure without abnormal findings: Secondary | ICD-10-CM

## 2021-07-20 NOTE — Progress Notes (Signed)
Blood Pressure Check Visit  Lisa [name] is here for blood pressure check. She is [redacted]w[redacted]d pregnant. Patient receives prenatal care at [facility]. Patient was seen in MAU on 07/14/21 for high blood pressure and was admitted for symptomatic bradycardia. BP today is 156/110. Blood pressure recheck approximately 10 minutes later is 151/76. Patient endorses headache and blurred vision today. Patient states she has not tried taking Tylenol or anything to aid with the headache. Patient takes 30 mg Nifedipine daily. Per patient her last dose of Nifedipine was yesterday morning- she states she has not taken her daily dose yet today. I reviewed all of this with Dr. Debroah Loop. Per Dr. Debroah Loop patient to increase Nifedipine from 30 mg daily to 60 mg daily and to have an appointment set up here for prenatal care. I reviewed all of this with the patient. Patient and her mother verbalized understanding.   Cline Crock, RN 07/20/2021

## 2021-07-21 ENCOUNTER — Other Ambulatory Visit: Payer: Self-pay

## 2021-07-21 ENCOUNTER — Inpatient Hospital Stay (HOSPITAL_COMMUNITY)
Admission: EM | Admit: 2021-07-21 | Discharge: 2021-07-29 | DRG: 785 | Disposition: A | Discharge status: Home / Self Care | Payer: Medicaid Other | Attending: Obstetrics and Gynecology | Admitting: Obstetrics and Gynecology

## 2021-07-21 ENCOUNTER — Encounter (HOSPITAL_COMMUNITY): Payer: Self-pay | Admitting: *Deleted

## 2021-07-21 DIAGNOSIS — O1424 HELLP syndrome, complicating childbirth: Secondary | ICD-10-CM | POA: Diagnosis present

## 2021-07-21 DIAGNOSIS — R569 Unspecified convulsions: Secondary | ICD-10-CM

## 2021-07-21 DIAGNOSIS — O99892 Other specified diseases and conditions complicating childbirth: Secondary | ICD-10-CM | POA: Diagnosis present

## 2021-07-21 DIAGNOSIS — O365930 Maternal care for other known or suspected poor fetal growth, third trimester, not applicable or unspecified: Secondary | ICD-10-CM | POA: Diagnosis present

## 2021-07-21 DIAGNOSIS — O358XX0 Maternal care for other (suspected) fetal abnormality and damage, not applicable or unspecified: Secondary | ICD-10-CM | POA: Diagnosis present

## 2021-07-21 DIAGNOSIS — O1490 Unspecified pre-eclampsia, unspecified trimester: Secondary | ICD-10-CM | POA: Insufficient documentation

## 2021-07-21 DIAGNOSIS — G40909 Epilepsy, unspecified, not intractable, without status epilepticus: Secondary | ICD-10-CM

## 2021-07-21 DIAGNOSIS — Z98891 History of uterine scar from previous surgery: Secondary | ICD-10-CM

## 2021-07-21 DIAGNOSIS — Z3A25 25 weeks gestation of pregnancy: Secondary | ICD-10-CM

## 2021-07-21 DIAGNOSIS — O99354 Diseases of the nervous system complicating childbirth: Principal | ICD-10-CM | POA: Diagnosis present

## 2021-07-21 DIAGNOSIS — Z3A24 24 weeks gestation of pregnancy: Secondary | ICD-10-CM

## 2021-07-21 DIAGNOSIS — Z302 Encounter for sterilization: Secondary | ICD-10-CM

## 2021-07-21 DIAGNOSIS — Z9079 Acquired absence of other genital organ(s): Secondary | ICD-10-CM

## 2021-07-21 DIAGNOSIS — W19XXXA Unspecified fall, initial encounter: Secondary | ICD-10-CM | POA: Diagnosis present

## 2021-07-21 DIAGNOSIS — O1420 HELLP syndrome (HELLP), unspecified trimester: Secondary | ICD-10-CM

## 2021-07-21 DIAGNOSIS — R200 Anesthesia of skin: Secondary | ICD-10-CM | POA: Diagnosis present

## 2021-07-21 MED ORDER — NIFEDIPINE ER OSMOTIC RELEASE 30 MG PO TB24: 30.0000 mg | ORAL_TABLET | Freq: Every day | ORAL | Status: DC

## 2021-07-21 MED ORDER — LORAZEPAM 2 MG/ML IJ SOLN
INTRAMUSCULAR | Status: AC
  Filled 2021-07-21: qty 1

## 2021-07-21 MED ORDER — LEVETIRACETAM IN NACL 1000 MG/100ML IV SOLN
1000.0000 mg | Freq: Once | INTRAVENOUS | Status: AC
  Administered 2021-07-21: 1000 mg via INTRAVENOUS

## 2021-07-21 MED ORDER — LORAZEPAM 2 MG/ML IJ SOLN
0.5000 mg | Freq: Once | INTRAMUSCULAR | Status: AC
  Administered 2021-07-21: 0.5 mg via INTRAVENOUS

## 2021-07-21 MED ORDER — HYDRALAZINE HCL 20 MG/ML IJ SOLN
10.0000 mg | Freq: Once | INTRAMUSCULAR | Status: AC
  Administered 2021-07-21: 10 mg via INTRAVENOUS
  Filled 2021-07-21: qty 1

## 2021-07-21 MED ORDER — NIFEDIPINE ER OSMOTIC RELEASE 30 MG PO TB24
30.0000 mg | ORAL_TABLET | Freq: Once | ORAL | Status: DC
  Filled 2021-07-21: qty 1

## 2021-07-21 MED ORDER — NIFEDIPINE 10 MG PO CAPS
30.0000 mg | ORAL_CAPSULE | Freq: Once | ORAL | Status: DC
Start: 2021-07-21 — End: 2021-07-21
  Filled 2021-07-21: qty 3
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
## 2021-07-21 NOTE — ED Triage Notes (Signed)
The [facility]  the pt is [redacted] weeks pregnant history of seizures  had 2 seizures one witnessed by paramedics lasting a minute with a postical phase following the seizure activuty  lmp  jan edc  oct 21sr  no tongue damage no incontinence

## 2021-07-21 NOTE — Progress Notes (Addendum)
Janae Sauce calls out and says pt is seizing.  ED physician and ED RN run into room along with rrobs.  Pt is turned on her side and o2 is applied.  Seizing stops after approximately 1 minute.  Dr Despina Hidden called again and orders for kepra and hydralazine given.

## 2021-07-21 NOTE — Progress Notes (Addendum)
Dr Despina Hidden notified of G1P0 at [redacted]w[redacted]d who came into ED tonight after having fallen at [facility].  Pts mother says that she became lightheaded and then fell on her side.  She also says that pt had two seizures. Pt is currently A&O x 3. MD opens chart and reviews pts recent admissions.  Dr Despina Hidden notified of increased BPs. Pt reports normal fetal movement and denies LOF or vag bleeding. FHR tracing is reassuring for gestation.  Dr Despina Hidden gives orders for 30mg  procardia and for a neuro consult.

## 2021-07-22 ENCOUNTER — Observation Stay (HOSPITAL_COMMUNITY): Payer: Medicaid Other

## 2021-07-22 ENCOUNTER — Encounter (HOSPITAL_COMMUNITY): Payer: Self-pay | Admitting: Obstetrics & Gynecology

## 2021-07-22 DIAGNOSIS — O99354 Diseases of the nervous system complicating childbirth: Secondary | ICD-10-CM | POA: Diagnosis present

## 2021-07-22 DIAGNOSIS — R569 Unspecified convulsions: Secondary | ICD-10-CM

## 2021-07-22 DIAGNOSIS — O365930 Maternal care for other known or suspected poor fetal growth, third trimester, not applicable or unspecified: Secondary | ICD-10-CM | POA: Diagnosis present

## 2021-07-22 DIAGNOSIS — G40909 Epilepsy, unspecified, not intractable, without status epilepticus: Secondary | ICD-10-CM | POA: Diagnosis not present

## 2021-07-22 DIAGNOSIS — Z3A24 24 weeks gestation of pregnancy: Secondary | ICD-10-CM | POA: Diagnosis not present

## 2021-07-22 DIAGNOSIS — O99892 Other specified diseases and conditions complicating childbirth: Secondary | ICD-10-CM | POA: Diagnosis present

## 2021-07-22 DIAGNOSIS — R200 Anesthesia of skin: Secondary | ICD-10-CM | POA: Diagnosis present

## 2021-07-22 DIAGNOSIS — O1422 HELLP syndrome (HELLP), second trimester: Secondary | ICD-10-CM | POA: Diagnosis not present

## 2021-07-22 DIAGNOSIS — O365929 Maternal care for other known or suspected poor fetal growth, second trimester, other fetus: Secondary | ICD-10-CM | POA: Diagnosis not present

## 2021-07-22 DIAGNOSIS — O1490 Unspecified pre-eclampsia, unspecified trimester: Secondary | ICD-10-CM | POA: Insufficient documentation

## 2021-07-22 DIAGNOSIS — O99350 Diseases of the nervous system complicating pregnancy, unspecified trimester: Secondary | ICD-10-CM | POA: Diagnosis not present

## 2021-07-22 DIAGNOSIS — W19XXXA Unspecified fall, initial encounter: Secondary | ICD-10-CM | POA: Diagnosis present

## 2021-07-22 DIAGNOSIS — O1424 HELLP syndrome, complicating childbirth: Secondary | ICD-10-CM | POA: Diagnosis present

## 2021-07-22 DIAGNOSIS — O358XX0 Maternal care for other (suspected) fetal abnormality and damage, not applicable or unspecified: Secondary | ICD-10-CM | POA: Diagnosis present

## 2021-07-22 DIAGNOSIS — Z3A25 25 weeks gestation of pregnancy: Secondary | ICD-10-CM | POA: Diagnosis not present

## 2021-07-22 DIAGNOSIS — O1412 Severe pre-eclampsia, second trimester: Secondary | ICD-10-CM | POA: Diagnosis not present

## 2021-07-22 DIAGNOSIS — Z302 Encounter for sterilization: Secondary | ICD-10-CM | POA: Diagnosis not present

## 2021-07-22 DIAGNOSIS — O285 Abnormal chromosomal and genetic finding on antenatal screening of mother: Secondary | ICD-10-CM | POA: Diagnosis not present

## 2021-07-22 DIAGNOSIS — O1414 Severe pre-eclampsia complicating childbirth: Secondary | ICD-10-CM | POA: Diagnosis not present

## 2021-07-22 LAB — CBC
HCT: 36 % (ref 36.0–46.0)
Hemoglobin: 12.8 g/dL (ref 12.0–15.0)
MCH: 28.4 pg (ref 26.0–34.0)
MCHC: 35.6 g/dL (ref 30.0–36.0)
MCV: 80 fL (ref 80.0–100.0)
Platelets: 166 10*3/uL (ref 150–400)
RBC: 4.5 MIL/uL (ref 3.87–5.11)
RDW: 13.8 % (ref 11.5–15.5)
WBC: 7.7 10*3/uL (ref 4.0–10.5)
nRBC: 0 % (ref 0.0–0.2)

## 2021-07-22 LAB — COMPREHENSIVE METABOLIC PANEL
ALT: 17 U/L (ref 0–44)
AST: 17 U/L (ref 15–41)
Albumin: 2.5 g/dL — ABNORMAL LOW (ref 3.5–5.0)
Alkaline Phosphatase: 69 U/L (ref 38–126)
Anion gap: 8 (ref 5–15)
BUN: 6 mg/dL (ref 6–20)
CO2: 20 mmol/L — ABNORMAL LOW (ref 22–32)
Calcium: 8.9 mg/dL (ref 8.9–10.3)
Chloride: 110 mmol/L (ref 98–111)
Creatinine, Ser: 0.79 mg/dL (ref 0.44–1.00)
GFR, Estimated: >60 mL/min (ref >60)
Glucose, Bld: 83 mg/dL (ref 70–99)
Potassium: 3.5 mmol/L (ref 3.5–5.1)
Sodium: 138 mmol/L (ref 135–145)
Total Bilirubin: 0.4 mg/dL (ref 0.3–1.2)
Total Protein: 5.5 g/dL — ABNORMAL LOW (ref 6.5–8.1)

## 2021-07-22 LAB — TYPE AND SCREEN
ABO/RH(D): B POS
Antibody Screen: NEGATIVE

## 2021-07-22 LAB — MAGNESIUM: Magnesium: 2 mg/dL (ref 1.7–2.4)

## 2021-07-22 MED ORDER — ENOXAPARIN SODIUM 60 MG/0.6ML IJ SOSY
60.0000 mg | PREFILLED_SYRINGE | INTRAMUSCULAR | Status: DC
  Administered 2021-07-22 – 2021-07-23 (×2): 60 mg via SUBCUTANEOUS
  Filled 2021-07-22 (×2): qty 0.6

## 2021-07-22 MED ORDER — BUDESONIDE 0.5 MG/2ML IN SUSP
0.5000 mg | Freq: Two times a day (BID) | RESPIRATORY_TRACT | Status: DC
  Administered 2021-07-22 – 2021-07-23 (×3): 0.5 mg via RESPIRATORY_TRACT
  Filled 2021-07-22 (×12): qty 2

## 2021-07-22 MED ORDER — NIFEDIPINE ER OSMOTIC RELEASE 30 MG PO TB24: 30.0000 mg | ORAL_TABLET | Freq: Every day | ORAL | Status: DC

## 2021-07-22 MED ORDER — NIFEDIPINE ER OSMOTIC RELEASE 60 MG PO TB24
60.0000 mg | ORAL_TABLET | Freq: Every day | ORAL | Status: DC
  Administered 2021-07-22 – 2021-07-26 (×5): 60 mg via ORAL
  Filled 2021-07-22 (×5): qty 1

## 2021-07-22 MED ORDER — DOCUSATE SODIUM 100 MG PO CAPS
100.0000 mg | ORAL_CAPSULE | Freq: Every day | ORAL | Status: DC
  Administered 2021-07-22 – 2021-07-24 (×3): 100 mg via ORAL
  Filled 2021-07-22 (×6): qty 1

## 2021-07-22 MED ORDER — ONDANSETRON HCL 4 MG/2ML IJ SOLN: 4.0000 mg | Freq: Four times a day (QID) | INTRAMUSCULAR | Status: DC | PRN

## 2021-07-22 MED ORDER — LACTATED RINGERS IV SOLN: INTRAVENOUS | Status: DC

## 2021-07-22 MED ORDER — ASPIRIN 81 MG PO TBEC
81.0000 mg | DELAYED_RELEASE_TABLET | Freq: Every day | ORAL | Status: DC
  Administered 2021-07-22 – 2021-07-26 (×5): 81 mg via ORAL
  Filled 2021-07-22 (×5): qty 1

## 2021-07-22 MED ORDER — ZOLPIDEM TARTRATE 5 MG PO TABS: 5.0000 mg | ORAL_TABLET | Freq: Every evening | ORAL | Status: DC | PRN

## 2021-07-22 MED ORDER — CALCIUM CARBONATE ANTACID 500 MG PO CHEW: 2.0000 | CHEWABLE_TABLET | ORAL | Status: DC | PRN

## 2021-07-22 MED ORDER — PRENATAL MULTIVITAMIN CH
1.0000 | ORAL_TABLET | Freq: Every day | ORAL | Status: DC
  Administered 2021-07-22 – 2021-07-25 (×4): 1 via ORAL
  Filled 2021-07-22 (×4): qty 1

## 2021-07-22 MED ORDER — CAFFEINE 200 MG PO TABS
200.0000 mg | ORAL_TABLET | Freq: Once | ORAL | Status: AC
  Administered 2021-07-22: 200 mg via ORAL
  Filled 2021-07-22 (×2): qty 1

## 2021-07-22 MED ORDER — NIFEDIPINE ER OSMOTIC RELEASE 60 MG PO TB24
60.0000 mg | ORAL_TABLET | Freq: Every day | ORAL | Status: DC
Start: 2021-07-22 — End: 2021-07-22

## 2021-07-22 MED ORDER — FAMOTIDINE 20 MG PO TABS: 20.0000 mg | ORAL_TABLET | Freq: Two times a day (BID) | ORAL | Status: DC | PRN

## 2021-07-22 MED ORDER — DIPHENHYDRAMINE HCL 25 MG PO CAPS
25.0000 mg | ORAL_CAPSULE | Freq: Once | ORAL | Status: AC | PRN
  Administered 2021-07-22: 25 mg via ORAL
  Filled 2021-07-22: qty 1

## 2021-07-22 MED ORDER — FLUTICASONE PROPIONATE 50 MCG/ACT NA SUSP
1.0000 | Freq: Every day | NASAL | Status: DC
  Administered 2021-07-22 – 2021-07-26 (×5): 1 via NASAL
  Filled 2021-07-22: qty 16

## 2021-07-22 MED ORDER — ACETAMINOPHEN 325 MG PO TABS
650.0000 mg | ORAL_TABLET | ORAL | Status: DC | PRN
  Administered 2021-07-22 – 2021-07-27 (×7): 650 mg via ORAL
  Filled 2021-07-22 (×7): qty 2

## 2021-07-22 MED ORDER — ALBUTEROL SULFATE (2.5 MG/3ML) 0.083% IN NEBU
3.0000 mL | INHALATION_SOLUTION | RESPIRATORY_TRACT | Status: DC | PRN
Start: 2021-07-22 — End: 2021-07-27

## 2021-07-22 MED ORDER — SODIUM CHLORIDE 0.9 % IV SOLN: INTRAVENOUS | Status: DC

## 2021-07-22 MED ORDER — NIFEDIPINE ER OSMOTIC RELEASE 60 MG PO TB24
60.0000 mg | ORAL_TABLET | Freq: Every day | ORAL | Status: DC
Start: 2021-07-22 — End: 2021-07-22
  Administered 2021-07-22: 60 mg via ORAL
  Filled 2021-07-22: qty 1

## 2021-07-22 MED ORDER — PROCHLORPERAZINE EDISYLATE 10 MG/2ML IJ SOLN
10.0000 mg | Freq: Once | INTRAMUSCULAR | Status: AC | PRN
Start: 2021-07-22 — End: 2021-07-22
  Administered 2021-07-22: 10 mg via INTRAVENOUS
  Filled 2021-07-22 (×2): qty 2

## 2021-07-22 MED ORDER — PRENATAL 27-0.8 MG PO TABS
1.0000 | ORAL_TABLET | Freq: Every day | ORAL | Status: DC
Start: 2021-07-22 — End: 2021-07-22

## 2021-07-22 NOTE — Progress Notes (Signed)
MRI read remains pending Prelim read of brain  MR negative for acute stroke or demyelination. Await formal radiology read. If negative, can be discharged from a neurological standpoint as no further inpatient work-up is needed. Outpatient neurology follow-up is recommended in 2 to 4 weeks-can send ambulatory referral to [facility] neurology and select hospital follow-up as the reason for expedited follow-up.   -- Milon Dikes, MD Neurologist [facility] Pager: [phone]

## 2021-07-22 NOTE — H&P (Signed)
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  Cranium:               Previously seen        LVOT:                   Appears normal  Cavum:                 Appears normal         Aortic Arch:            Appears normal  Ventricles:            Appears normal         Ductal Arch:            Appears normal  Choroid Plexus:        Previously seen        Diaphragm:              Appears normal  Cerebellum:            Previously seen        Stomach:                Appears normal, left                                                                         sided  Posterior Fossa:       Previously seen        Abdomen:                Appears normal  Nuchal Fold:           Previously seen        Abdominal Wall:         Previously seen  Face:                  Orbits and profile     Cord  Vessels:           Previously seen                         previously seen  Lips:                  Previously seen        Kidneys:                Appear normal  Palate:                Not well visualized    Bladder:                Appears normal  Thoracic:              Appears normal         Spine:                  Previously seen  Heart:                 Appears normal         Upper Extremities:      Previously seen                         (4CH, axis, and                         situs)  RVOT:                  Appears normal         Lower Extremities:      Previously seen  Other:  Lenses, nasal bone and  3VV visualized. Technically difficult due to          maternal habitus and fetal position. ---------------------------------------------------------------------- Cervix Uterus Adnexa  Cervix  Length:            4.2  cm.  Not visualized (advanced GA >24wks)  Uterus  No abnormality visualized.  Right Ovary  Within normal limits.  Left Ovary  Within normal limits.  Cul De Sac  No free fluid seen.  Adnexa  No abnormality visualized. 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  She has declined an amniocentesis for  prenatal diagnosis.  She denies any problems since her last  exam.  She was informed that the fetal growth and amniotic fluid  level appears appropriate for her gestational age.  The fetal cardiac views were visualized today.  There were no  obvious cardiac anomalies noted.  The limitations of  ultrasound in the detection of all anomalies was discussed.  

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
## 2021-07-22 NOTE — Progress Notes (Signed)
LTM EEG discontinued - no skin breakdown at unhook.   

## 2021-07-22 NOTE — Procedures (Signed)
Patient Name: Lisa [name]  MRN: [id number]  Epilepsy Attending: Charlsie Quest  Referring Physician/Provider: Rejeana Brock, MD  Duration: 07/22/2021 0304 to 1141  Patient history:  26 year old female with a known history of psychogenic nonepileptic spells as well as a childhood history of epilepsy who presents with breakthrough episodes. EEG to evaluate for seizure.  Level of alertness: Awake, asleep  AEDs during EEG study: None  Technical aspects: This EEG study was done with scalp electrodes positioned according to the 10-20 International system of electrode placement. Electrical activity was acquired at a sampling rate of 500Hz  and reviewed with a high frequency filter of 70Hz  and a low frequency filter of 1Hz . EEG data were recorded continuously and digitally stored.   Description: The posterior dominant rhythm consists of 8-9Hz  activity of moderate voltage (25-35 uV) seen predominantly in posterior head regions, symmetric and reactive to eye opening and eye closing. Sleep was characterized by vertex waves, sleep spindles (12 to 14 Hz), maximal frontocentral region.    Hyperventilation and photic stimulation were not performed.     One event was recorded on 7/15/223 at 0752 during which patient was laying in bed and had sudden violent whole body jerking for about 7-8 seconds. Concomitant eeg before, during and after the event didn't show any eeg change to suggest seizure.  IMPRESSION: This study is within normal limits. No seizures or epileptiform discharges were seen throughout the recording.  One event was recorded on 7/15/223 at 0752 as describe above without concomitant eeg change. This was a NON-epileptic event.  [name] 
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
## 2021-07-22 NOTE — Progress Notes (Signed)
Neurologist in room. Patient states she fell on her abdomen 2 days ago. MD. Notified

## 2021-07-22 NOTE — Progress Notes (Signed)
Pt seized a second time at 2330 for approximately 15 seconds. 0.5mg  ativan administered 2338 by ED RN. [phone] Neurologist at bedside. O2 removed at 2345 per neurologist. ED MD spoke with Despina Hidden, MD at 0001 when [facility] decided to transfer pt to [facility]. [facility] CN notified at 0006 of transfer. 0009 RROB spoke with Eure about transfering pt and modifying procardia to 60mg . RN not administered yet due to LOC. Pt transferred to [facility] at 0040.

## 2021-07-22 NOTE — Progress Notes (Signed)
Called to pt. Room. Patient stated not feeling well and feeling hot. S/S seizure activity noted at 0750 lasting approx. 30 seconds. Patient placed in left lateral position and monitored. Patient alert and oriented x 4 with no post ictal symptoms noted. Dr. Despina Hidden notified and at bedside at 0810. Patient called mother using bedside phone to update.

## 2021-07-22 NOTE — Progress Notes (Signed)
LTM EEG hooked up and running - no initial skin breakdown - push button tested - neuro notified. Observation  [facility] monitoring.

## 2021-07-23 LAB — PROTEIN / CREATININE RATIO, URINE
Creatinine, Urine: 261 mg/dL
Protein Creatinine Ratio: 8.56 mg/mg{Cre} — ABNORMAL HIGH (ref 0.00–0.15)
Total Protein, Urine: 2233 mg/dL

## 2021-07-23 LAB — CBC WITH DIFFERENTIAL/PLATELET
Abs Immature Granulocytes: 0.03 10*3/uL (ref 0.00–0.07)
Basophils Absolute: 0 10*3/uL (ref 0.0–0.1)
Basophils Relative: 0 %
Eosinophils Absolute: 0.1 10*3/uL (ref 0.0–0.5)
Eosinophils Relative: 1 %
HCT: 33.1 % — ABNORMAL LOW (ref 36.0–46.0)
Hemoglobin: 11.8 g/dL — ABNORMAL LOW (ref 12.0–15.0)
Immature Granulocytes: 0 %
Lymphocytes Relative: 25 %
Lymphs Abs: 1.9 10*3/uL (ref 0.7–4.0)
MCH: 28.6 pg (ref 26.0–34.0)
MCHC: 35.6 g/dL (ref 30.0–36.0)
MCV: 80.3 fL (ref 80.0–100.0)
Monocytes Absolute: 0.7 10*3/uL (ref 0.1–1.0)
Monocytes Relative: 10 %
Neutro Abs: 4.9 10*3/uL (ref 1.7–7.7)
Neutrophils Relative %: 64 %
Platelets: 58 10*3/uL — ABNORMAL LOW (ref 150–400)
RBC: 4.12 MIL/uL (ref 3.87–5.11)
RDW: 14.2 % (ref 11.5–15.5)
WBC: 7.7 10*3/uL (ref 4.0–10.5)
nRBC: 0 % (ref 0.0–0.2)

## 2021-07-23 LAB — CBC
HCT: 31.6 % — ABNORMAL LOW (ref 36.0–46.0)
Hemoglobin: 11.3 g/dL — ABNORMAL LOW (ref 12.0–15.0)
MCH: 27.9 pg (ref 26.0–34.0)
MCHC: 35.8 g/dL (ref 30.0–36.0)
MCV: 78 fL — ABNORMAL LOW (ref 80.0–100.0)
Platelets: 58 10*3/uL — ABNORMAL LOW (ref 150–400)
RBC: 4.05 MIL/uL (ref 3.87–5.11)
RDW: 14.1 % (ref 11.5–15.5)
WBC: 6.1 10*3/uL (ref 4.0–10.5)
nRBC: 0 % (ref 0.0–0.2)

## 2021-07-23 LAB — COMPREHENSIVE METABOLIC PANEL
ALT: 32 U/L (ref 0–44)
AST: 40 U/L (ref 15–41)
Albumin: 2 g/dL — ABNORMAL LOW (ref 3.5–5.0)
Alkaline Phosphatase: 60 U/L (ref 38–126)
Anion gap: 6 (ref 5–15)
BUN: 7 mg/dL (ref 6–20)
CO2: 19 mmol/L — ABNORMAL LOW (ref 22–32)
Calcium: 7.9 mg/dL — ABNORMAL LOW (ref 8.9–10.3)
Chloride: 107 mmol/L (ref 98–111)
Creatinine, Ser: 0.65 mg/dL (ref 0.44–1.00)
GFR, Estimated: >60 mL/min (ref >60)
Glucose, Bld: 76 mg/dL (ref 70–99)
Potassium: 3.3 mmol/L — ABNORMAL LOW (ref 3.5–5.1)
Sodium: 132 mmol/L — ABNORMAL LOW (ref 135–145)
Total Bilirubin: 1.6 mg/dL — ABNORMAL HIGH (ref 0.3–1.2)
Total Protein: 4.7 g/dL — ABNORMAL LOW (ref 6.5–8.1)

## 2021-07-23 LAB — URINALYSIS, ROUTINE W REFLEX MICROSCOPIC
Bilirubin Urine: NEGATIVE
Glucose, UA: NEGATIVE mg/dL
Hgb urine dipstick: NEGATIVE
Ketones, ur: NEGATIVE mg/dL
Leukocytes,Ua: NEGATIVE
Nitrite: NEGATIVE
Protein, ur: >=300 mg/dL — AB
Specific Gravity, Urine: 1.03 (ref 1.005–1.030)
pH: 6 (ref 5.0–8.0)

## 2021-07-23 MED ORDER — LORAZEPAM 1 MG PO TABS
1.0000 mg | ORAL_TABLET | Freq: Once | ORAL | Status: AC
Start: 2021-07-23 — End: 2021-07-23
  Administered 2021-07-23: 1 mg via ORAL
  Filled 2021-07-23: qty 1

## 2021-07-23 MED ORDER — LAMOTRIGINE 100 MG PO TABS: 100.0000 mg | ORAL_TABLET | Freq: Every day | ORAL | Status: DC

## 2021-07-23 MED ORDER — LAMOTRIGINE 25 MG PO TABS
25.0000 mg | ORAL_TABLET | Freq: Every day | ORAL | Status: DC
  Administered 2021-07-23 – 2021-07-26 (×4): 25 mg via ORAL
  Filled 2021-07-23 (×6): qty 1

## 2021-07-23 MED ORDER — BETAMETHASONE SOD PHOS & ACET 6 (3-3) MG/ML IJ SUSP
12.0000 mg | INTRAMUSCULAR | Status: AC
  Administered 2021-07-23 – 2021-07-24 (×2): 12 mg via INTRAMUSCULAR
  Filled 2021-07-23: qty 5

## 2021-07-23 MED ORDER — BETAMETHASONE SOD PHOS & ACET 6 (3-3) MG/ML IJ SUSP: 12.0000 mg | INTRAMUSCULAR | Status: DC

## 2021-07-23 NOTE — Progress Notes (Addendum)
CSW met with patient at bedside to address consult placed due to patient's social history. Per chart review, patient has legal guardian Jolee Ewing, pt's mother). When CSW entered room, patient's mother was not present. Patient's brother's girlfriend was in room with patient. CSW placed call to patient's mother on speaker phone while in room with patient and visitor. Patient's mother and patient provided verbal consent to speak in front of visitor about anything. CSW introduced self and explained reason for consult. Patient's mother had spotty cell phone service, making communication difficult. Patient's mother requested that CSW return tomorrow, 07/24/21 to inquire about resource needs. Weekday CSW to follow up.  Signed,  Berniece Salines, MSW, LCSWA, LCASA 07/23/2021 4:16 PM

## 2021-07-23 NOTE — Progress Notes (Signed)
Patient ID: Lisa [name], female   DOB: 1995/12/07, 26 y.o.   MRN: [id number] FACULTY PRACTICE ANTEPARTUM(COMPREHENSIVE) NOTE  VALIA WINGARD is a 26 y.o. G1P0000 with Estimated Date of Delivery: 11/07/21   By  early ultrasound [redacted]w[redacted]d  who is admitted for seizure activity.    Fetal presentation is unsure. Length of Stay:  1  Days  Date of admission:07/21/2021  Subjective: No additional seizure activity since 7/15 am Back ache, chronic No other complaints Patient reports the fetal movement as active. Patient reports uterine contraction  activity as none. Patient reports  vaginal bleeding as none. Patient describes fluid per vagina as None.  Vitals:  Blood pressure 134/82, pulse 82, temperature 98.3 F (36.8 C), temperature source Oral, resp. rate 16, height 5\' 2"  (1.575 m), weight 117.7 kg, last menstrual period 01/24/2021, SpO2 100 %. Vitals:   07/22/21 1945 07/22/21 2148 07/22/21 2335 07/23/21 0329  BP: 126/70  105/78 134/82  Pulse: 90  95 82  Resp: 18  20 16   Temp: 97.9 F (36.6 C)  97.7 F (36.5 C) 98.3 F (36.8 C)  TempSrc: Oral  Axillary Oral  SpO2: 100% 99% 100% 100%  Weight:      Height:       Physical Examination:  General appearance - alert, well appearing, and in no distress Abdomen - soft, nontender, nondistended, no masses or organomegaly +paraspinous tenderness Fundal Height:  size equals dates Pelvic Exam:  examination not indicated Cervical Exam: Not evaluated.  Extremities: extremities normal, atraumatic, no cyanosis or edema with DTRs 2+ bilaterally Membranes:intact  Fetal Monitoring:  Baseline: 150 bpm, Variability: Good {> 6 bpm), Accelerations: Non-reactive but appropriate for gestational age, and Decelerations: Absent   appropriate  Labs:  Results for orders placed or performed during the hospital encounter of 07/21/21 (from the past 24 hour(s))  Type and screen [facility]   Collection Time: 07/22/21  8:57 AM  Result Value Ref  Range   ABO/RH(D) B POS    Antibody Screen NEG    Sample Expiration      07/25/2021,2359 Performed at [facility], [address], [place], [address] [place]   Urinalysis, Routine w reflex microscopic Urine, Clean Catch   Collection Time: 07/23/21 12:01 AM  Result Value Ref Range   Color, Urine AMBER (A) YELLOW   APPearance CLOUDY (A) CLEAR   Specific Gravity, Urine 1.030 1.005 - 1.030   pH 6.0 5.0 - 8.0   Glucose, UA NEGATIVE NEGATIVE mg/dL   Hgb urine dipstick NEGATIVE NEGATIVE   Bilirubin Urine NEGATIVE NEGATIVE   Ketones, ur NEGATIVE NEGATIVE mg/dL   Protein, ur [address] (A) NEGATIVE mg/dL   Nitrite NEGATIVE NEGATIVE   Leukocytes,Ua NEGATIVE NEGATIVE   RBC / HPF 0-5 0 - 5 RBC/hpf   WBC, UA 6-10 0 - 5 WBC/hpf   Bacteria, UA RARE (A) NONE SEEN   Squamous Epithelial / LPF 11-20 0 - 5   Mucus PRESENT    Hyaline Casts, UA PRESENT   Protein / creatinine ratio, urine   Collection Time: 07/23/21 12:01 AM  Result Value Ref Range   Creatinine, Urine 261 mg/dL   Total Protein, Urine 2,233 mg/dL   Protein Creatinine Ratio 8.56 (H) 0.00 - 0.15 mg/mg[Cre]  CBC   Collection Time: 07/23/21  5:22 AM  Result Value Ref Range   WBC 6.1 4.0 - 10.5 K/uL   RBC 4.05 3.87 - 5.11 MIL/uL   Hemoglobin 11.3 (L) 12.0 - 15.0 g/dL  HCT 31.6 (L) 36.0 - 46.0 %   MCV 78.0 (L) 80.0 - 100.0 fL   MCH 27.9 26.0 - 34.0 pg   MCHC 35.8 30.0 - 36.0 g/dL   RDW 69.6 78.9 - 38.1 %   Platelets 58 (L) 150 - 400 K/uL   nRBC 0.0 0.0 - 0.2 %  Comprehensive metabolic panel   Collection Time: 07/23/21  5:22 AM  Result Value Ref Range   Sodium 132 (L) 135 - 145 mmol/L   Potassium 3.3 (L) 3.5 - 5.1 mmol/L   Chloride 107 98 - 111 mmol/L   CO2 19 (L) 22 - 32 mmol/L   Glucose, Bld 76 70 - 99 mg/dL   BUN 7 6 - 20 mg/dL   Creatinine, Ser 0.17 0.44 - 1.00 mg/dL   Calcium 7.9 (L) 8.9 - 10.3 mg/dL   Total Protein 4.7 (L) 6.5 - 8.1 g/dL   Albumin 2.0 (L) 3.5 - 5.0 g/dL   AST 40 15 - 41 U/L   ALT 32 0 - 44 U/L    Alkaline Phosphatase 60 38 - 126 U/L   Total Bilirubin 1.6 (H) 0.3 - 1.2 mg/dL   GFR, Estimated >51 >02 mL/min   Anion gap 6 5 - 15    Imaging Studies:    MR CERVICAL SPINE WO CONTRAST  Result Date: 07/23/2021 CLINICAL DATA:  Initial evaluation for acute myelopathy, left-sided weakness for several days. EXAM: MRI CERVICAL SPINE WITHOUT CONTRAST TECHNIQUE: Multiplanar, multisequence MR imaging of the cervical spine was performed. No intravenous contrast was administered. COMPARISON:  Prior CT from 12/15/2016. FINDINGS: Alignment: Examination moderately degraded by motion artifact. Straightening of the normal cervical lordosis.  No listhesis. Vertebrae: Vertebral body height maintained without acute or chronic fracture. Bone marrow signal intensity diffusely decreased on T1 weighted sequence, nonspecific, but most commonly related to anemia, smoking or obesity. No focal marrow replacing lesion. No abnormal marrow edema. Cord: Grossly normal signal and morphology on this motion degraded exam. Posterior Fossa, vertebral arteries, paraspinal tissues: Unremarkable. Disc levels: No significant disc pathology seen within the cervical spine. Intervertebral disc space height maintained with no appreciable significant disc bulge or focal disc herniation. No significant facet pathology. No canal or neural foraminal stenosis. No impingement. IMPRESSION: 1. Motion degraded exam. 2. Grossly normal MRI of the cervical spine and spinal cord. No significant disc pathology, stenosis, or evidence for neural impingement. Electronically Signed   By: Rise Mu M.D.   On: 07/23/2021 03:30   MR BRAIN WO CONTRAST  Result Date: 07/23/2021 CLINICAL DATA:  Initial evaluation for several day history of left-sided weakness. EXAM: MRI HEAD WITHOUT CONTRAST TECHNIQUE: Multiplanar, multiecho pulse sequences of the brain and surrounding structures were obtained without intravenous contrast. COMPARISON:  Prior CT from  12/15/2016. FINDINGS: Brain: Examination mildly degraded by motion artifact. Cerebral volume within normal limits. No focal parenchymal signal abnormality or significant cerebral white matter disease. No abnormal foci of restricted diffusion to suggest acute or subacute ischemia. Gray-white matter differentiation maintained. No encephalomalacia to suggest prior cortical infarction or other insult. No foci of susceptibility artifact to suggest acute or chronic intracranial hemorrhage. No mass lesion, midline shift or mass effect. No hydrocephalus or extra-axial fluid collection. Pituitary gland and suprasellar region within normal limits. Midline structures intact and normally formed. Vascular: Major intracranial vascular flow voids are maintained. Skull and upper cervical spine: Craniocervical junction within normal limits. Bone marrow signal intensity diffusely decreased on T1 weighted sequence, nonspecific, but most commonly related to anemia, smoking  or obesity. No focal marrow replacing lesion. No scalp soft tissue abnormality. Sinuses/Orbits: Globes orbital soft tissues within normal limits. Scattered mucosal thickening noted about the ethmoidal air cells and maxillary sinuses. No significant mastoid effusion. Other: None. IMPRESSION: Normal brain MRI. No acute intracranial abnormality or findings to explain patient's symptoms. Electronically Signed   By: Rise Mu M.D.   On: 07/23/2021 03:24   Overnight EEG with video  Result Date: 07/22/2021 Charlsie Quest, MD     07/22/2021  7:19 AM Patient Name: LIYANA SUNIGA MRN: [id number] Epilepsy Attending: Charlsie Quest Referring Physician/Provider: Rejeana Brock, MD Duration: 07/22/2021 0304 to 0715 Patient history:  26 year old female with a known history of psychogenic nonepileptic spells as well as a childhood history of epilepsy who presents with breakthrough episodes. EEG to evaluate for seizure. Level of alertness: Awake, asleep AEDs  during EEG study: None Technical aspects: This EEG study was done with scalp electrodes positioned according to the 10-20 International system of electrode placement. Electrical activity was acquired at a sampling rate of 500Hz  and reviewed with a high frequency filter of 70Hz  and a low frequency filter of 1Hz . EEG data were recorded continuously and digitally stored. Description: The posterior dominant rhythm consists of 8-9Hz  activity of moderate voltage (25-35 uV) seen predominantly in posterior head regions, symmetric and reactive to eye opening and eye closing. Sleep was characterized by vertex waves, sleep spindles (12 to 14 Hz), maximal frontocentral region.    Hyperventilation and photic stimulation were not performed.   IMPRESSION: This study is within normal limits. No seizures or epileptiform discharges were seen throughout the recording. Priyanka     Medications:  Scheduled  aspirin EC  81 mg Oral Daily   budesonide  0.5 mg Nebulization BID   docusate sodium  100 mg Oral Daily   enoxaparin (LOVENOX) injection  60 mg Subcutaneous Q24H   fluticasone  1 spray Each Nare Daily   NIFEdipine  60 mg Oral Daily   prenatal multivitamin  1 tablet Oral Q1200   I have reviewed the patient's current medications.  ASSESSMENT + Plan: G1P0000 [redacted]w[redacted]d Estimated Date of Delivery: 11/07/21    >Pre eclampsia, without severe features thus far.  LFTs normal, creatinine is good, platelets low, but they had difficulty drawing blood so I would think given the level yesterday this is coagulation in the tube which caused consumption, resulting in a platelet count of 56.  Repeat Is ordered. Pr/Cr is 8.56--> 24 hour urine protein is ordered No evidence of PRES on MRI noted. Do not think her seizure activity has anything to do with her hypertensive disorder or pregnancy at this point, would not have resolved and clinical status would have clinically deteriorated.  So true she had non epileptic seizures, true she  has evolved to pre eclampsia, at a quite early Annabelle Harman but they appear unrelated. BP is well controlled on procardia xl 60.    >Seizure activity:  MRI is normal.  EEG shows no epileptic activity.  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  Patient has history of childhood seizures, has been off of depakote for many years.  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  Neurology recommends no therapy unless they continue with some regularity, lamictal specifically, pt received an IV dose on admission, whic has mood stabilizing effects as well.  Anti anxiety management is also considered, can't take vistaril, consider ativan 0.5 mg BID initially if needed. Appreciate neurology's input and management!  Amaryllis Dyke Sherrel Shafer 07/23/2021,7:14 AM

## 2021-07-23 NOTE — Progress Notes (Signed)
MR read formally by radiology. No acute abnormality. Plan as listed prior. Please call with questions.  -- Milon Dikes, MD Neurologist [facility] Pager: [phone]

## 2021-07-23 NOTE — Progress Notes (Signed)
Patient voided and urine was discarded. 24 hour urine started now.

## 2021-07-24 DIAGNOSIS — R569 Unspecified convulsions: Secondary | ICD-10-CM | POA: Diagnosis not present

## 2021-07-24 LAB — PROTEIN, URINE, 24 HOUR
Collection Interval-UPROT: 24 hours
Protein, 24H Urine: 8547 mg/d — ABNORMAL HIGH (ref 50–100)
Protein, Urine: 777 mg/dL
Urine Total Volume-UPROT: 1100 mL

## 2021-07-24 LAB — COMPREHENSIVE METABOLIC PANEL
ALT: 27 U/L (ref 0–44)
AST: 22 U/L (ref 15–41)
Albumin: 2.4 g/dL — ABNORMAL LOW (ref 3.5–5.0)
Alkaline Phosphatase: 67 U/L (ref 38–126)
Anion gap: 9 (ref 5–15)
BUN: 9 mg/dL (ref 6–20)
CO2: 20 mmol/L — ABNORMAL LOW (ref 22–32)
Calcium: 8.7 mg/dL — ABNORMAL LOW (ref 8.9–10.3)
Chloride: 106 mmol/L (ref 98–111)
Creatinine, Ser: 0.94 mg/dL (ref 0.44–1.00)
GFR, Estimated: >60 mL/min (ref >60)
Glucose, Bld: 91 mg/dL (ref 70–99)
Potassium: 3.6 mmol/L (ref 3.5–5.1)
Sodium: 135 mmol/L (ref 135–145)
Total Bilirubin: 0.6 mg/dL (ref 0.3–1.2)
Total Protein: 5.5 g/dL — ABNORMAL LOW (ref 6.5–8.1)

## 2021-07-24 LAB — CBC
HCT: 34 % — ABNORMAL LOW (ref 36.0–46.0)
Hemoglobin: 12.1 g/dL (ref 12.0–15.0)
MCH: 28.2 pg (ref 26.0–34.0)
MCHC: 35.6 g/dL (ref 30.0–36.0)
MCV: 79.3 fL — ABNORMAL LOW (ref 80.0–100.0)
Platelets: 65 10*3/uL — ABNORMAL LOW (ref 150–400)
RBC: 4.29 MIL/uL (ref 3.87–5.11)
RDW: 14.1 % (ref 11.5–15.5)
WBC: 9.6 10*3/uL (ref 4.0–10.5)
nRBC: 0 % (ref 0.0–0.2)

## 2021-07-24 NOTE — Progress Notes (Signed)
CSW attempted to meet with patient to address consult; however, patient requested that CSW return in a hour when her mother arrives. CSW agreed to check back later.   Celso Sickle, LCSW Clinical Social Worker [facility] Cell#: [phone]
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
## 2021-07-24 NOTE — Progress Notes (Signed)
Patient ID: [name], female   DOB: 1995/04/26, 26 y.o.   MRN: [id number] Patient ID: BRION HEDGES, female   DOB: September 19, 1995, 26 y.o.   MRN: [id number] FACULTY PRACTICE ANTEPARTUM(COMPREHENSIVE) NOTE  LETICA GIAIMO is a 26 y.o. G1P0000 with Estimated Date of Delivery: 11/07/21   By  early ultrasound [redacted]w[redacted]d  who is admitted for seizure activity.    Fetal presentation is unsure. Length of Stay:  2  Days  Date of admission:07/21/2021  Subjective: No additional seizure activity since 7/15 am Back ache, chronic No other complaints Patient reports the fetal movement as active. Patient reports uterine contraction  activity as none. Patient reports  vaginal bleeding as none. Patient describes fluid per vagina as None.  Vitals:  Blood pressure 136/87, pulse 97, temperature 98.2 F (36.8 C), temperature source Oral, resp. rate 18, height 5\' 2"  (1.575 m), weight 117.7 kg, last menstrual period 01/24/2021, SpO2 98 %. Vitals:   07/23/21 1753 07/23/21 2221 07/24/21 0352 07/24/21 0811  BP: 122/76 (!) 153/94 127/79 136/87  Pulse: (!) 108 (!) 116 83 97  Resp: 18 18 20 18   Temp: 98.3 F (36.8 C) 99.7 F (37.6 C) (!) 97.4 F (36.3 C) 98.2 F (36.8 C)  TempSrc: Oral Oral Axillary Oral  SpO2: 99% 100% 98% 98%  Weight:      Height:       Physical Examination:  General appearance - alert, well appearing, and in no distress Abdomen - soft, nontender, nondistended, no masses or organomegaly +paraspinous tenderness Fundal Height:  size equals dates Pelvic Exam:  examination not indicated Cervical Exam: Not evaluated.  Extremities: extremities normal, atraumatic, no cyanosis or edema with DTRs 2+ bilaterally no clonus Membranes:intact  Fetal Monitoring:  Baseline: 150 bpm, Variability: Good {> 6 bpm), Accelerations: Non-reactive but appropriate for gestational age, and Decelerations: Absent   appropriate  Labs:      Latest Ref Rng & Units 07/24/2021    8:08 AM 07/23/2021    7:56 AM  07/23/2021    5:22 AM  CBC  WBC 4.0 - 10.5 K/uL 9.6  7.7  6.1   Hemoglobin 12.0 - 15.0 g/dL 07/25/2021  07/25/2021  36.1   Hematocrit 36.0 - 46.0 % 34.0  33.1  31.6   Platelets 150 - 400 K/uL 65  58  58        Latest Ref Rng & Units 07/24/2021    8:08 AM 07/23/2021    5:22 AM 07/22/2021   12:15 AM  CMP  Glucose 70 - 99 mg/dL 91  76  83   BUN 6 - 20 mg/dL 9  7  6    Creatinine 0.44 - 1.00 mg/dL 07/25/2021  07/24/2021    Sodium 135 - 145 mmol/L 135  132  138   Potassium 3.5 - 5.1 mmol/L 3.6  3.3  3.5   Chloride 98 - 111 mmol/L 106  107  110   CO2 22 - 32 mmol/L 20  19  20    Calcium 8.9 - 10.3 mg/dL 8.7  7.9  8.9   Total Protein 6.5 - 8.1 g/dL 5.5  4.7  5.5   Total Bilirubin 0.3 - 1.2 mg/dL 0.6  1.6  0.4   Alkaline Phos 38 - 126 U/L 67  60  69   AST 15 - 41 U/L 22  40  17   ALT 0 - 44 U/L 27  32  17     Imaging Studies:    MR CERVICAL SPINE  WO CONTRAST  Result Date: 07/23/2021 CLINICAL DATA:  Initial evaluation for acute myelopathy, left-sided weakness for several days. EXAM: MRI CERVICAL SPINE WITHOUT CONTRAST TECHNIQUE: Multiplanar, multisequence MR imaging of the cervical spine was performed. No intravenous contrast was administered. COMPARISON:  Prior CT from 12/15/2016. FINDINGS: Alignment: Examination moderately degraded by motion artifact. Straightening of the normal cervical lordosis.  No listhesis. Vertebrae: Vertebral body height maintained without acute or chronic fracture. Bone marrow signal intensity diffusely decreased on T1 weighted sequence, nonspecific, but most commonly related to anemia, smoking or obesity. No focal marrow replacing lesion. No abnormal marrow edema. Cord: Grossly normal signal and morphology on this motion degraded exam. Posterior Fossa, vertebral arteries, paraspinal tissues: Unremarkable. Disc levels: No significant disc pathology seen within the cervical spine. Intervertebral disc space height maintained with no appreciable significant disc bulge or focal disc herniation.  No significant facet pathology. No canal or neural foraminal stenosis. No impingement. IMPRESSION: 1. Motion degraded exam. 2. Grossly normal MRI of the cervical spine and spinal cord. No significant disc pathology, stenosis, or evidence for neural impingement. Electronically Signed   By: Rise Mu M.D.   On: 07/23/2021 03:30   MR BRAIN WO CONTRAST  Result Date: 07/23/2021 CLINICAL DATA:  Initial evaluation for several day history of left-sided weakness. EXAM: MRI HEAD WITHOUT CONTRAST TECHNIQUE: Multiplanar, multiecho pulse sequences of the brain and surrounding structures were obtained without intravenous contrast. COMPARISON:  Prior CT from 12/15/2016. FINDINGS: Brain: Examination mildly degraded by motion artifact. Cerebral volume within normal limits. No focal parenchymal signal abnormality or significant cerebral white matter disease. No abnormal foci of restricted diffusion to suggest acute or subacute ischemia. Gray-white matter differentiation maintained. No encephalomalacia to suggest prior cortical infarction or other insult. No foci of susceptibility artifact to suggest acute or chronic intracranial hemorrhage. No mass lesion, midline shift or mass effect. No hydrocephalus or extra-axial fluid collection. Pituitary gland and suprasellar region within normal limits. Midline structures intact and normally formed. Vascular: Major intracranial vascular flow voids are maintained. Skull and upper cervical spine: Craniocervical junction within normal limits. Bone marrow signal intensity diffusely decreased on T1 weighted sequence, nonspecific, but most commonly related to anemia, smoking or obesity. No focal marrow replacing lesion. No scalp soft tissue abnormality. Sinuses/Orbits: Globes orbital soft tissues within normal limits. Scattered mucosal thickening noted about the ethmoidal air cells and maxillary sinuses. No significant mastoid effusion. Other: None. IMPRESSION: Normal brain MRI. No  acute intracranial abnormality or findings to explain patient's symptoms. Electronically Signed   By: Rise Mu M.D.   On: 07/23/2021 03:24   Overnight EEG with video  Result Date: 07/22/2021 Charlsie Quest, MD     07/23/2021  8:48 AM Patient Name: ANNALUCIA LAINO MRN: [id number] Epilepsy Attending: Charlsie Quest Referring Physician/Provider: Rejeana Brock, MD Duration: 07/22/2021 0304 to 1141 Patient history:  26 year old female with a known history of psychogenic nonepileptic spells as well as a childhood history of epilepsy who presents with breakthrough episodes. EEG to evaluate for seizure. Level of alertness: Awake, asleep AEDs during EEG study: None Technical aspects: This EEG study was done with scalp electrodes positioned according to the 10-20 International system of electrode placement. Electrical activity was acquired at a sampling rate of 500Hz  and reviewed with a high frequency filter of 70Hz  and a low frequency filter of 1Hz . EEG data were recorded continuously and digitally stored. Description: The posterior dominant rhythm consists of 8-9Hz  activity of moderate voltage (25-35 uV) seen predominantly in posterior  head regions, symmetric and reactive to eye opening and eye closing. Sleep was characterized by vertex waves, sleep spindles (12 to 14 Hz), maximal frontocentral region.    Hyperventilation and photic stimulation were not performed.   One event was recorded on 7/15/223 at 0752 during which patient was laying in bed and had sudden violent whole body jerking for about 7-8 seconds. Concomitant eeg before, during and after the event didn't show any eeg change to suggest seizure. IMPRESSION: This study is within normal limits. No seizures or epileptiform discharges were seen throughout the recording. One event was recorded on 7/15/223 at 0752 as describe above without concomitant eeg change. This was a NON-epileptic event. Priyanka Annabelle Harman     Medications:  Scheduled   aspirin EC  81 mg Oral Daily   betamethasone acetate-betamethasone sodium phosphate  12 mg Intramuscular Q24 Hr x 2   budesonide  0.5 mg Nebulization BID   docusate sodium  100 mg Oral Daily   fluticasone  1 spray Each Nare Daily   lamoTRIgine  25 mg Oral Daily   NIFEdipine  60 mg Oral Daily   prenatal multivitamin  1 tablet Oral Q1200   I have reviewed the patient's current medications.  ASSESSMENT + Plan: G1P0000 [redacted]w[redacted]d Estimated Date of Delivery: 11/07/21    >Pre eclampsia,  LFTs normal, creatinine has ^from .65 to 0.94, Platelets remain low, lovenox stopped in case it it platelet associated antibodies, it is difficult to reconcile that this sudden, precipitous drop is pre eclampsia related, but we are not naive to the fact it could be.  Pr/Cr is 8.56--> 24 hour urine protein is pending  No evidence of PRES on MRI noted. Do not think her seizure activity has anything to do with her hypertensive disorder of pregnancy at this point, would not have resolved and clinical status would have clinically deteriorated.  So true she had non epileptic seizures, true she has evolved to pre eclampsia, at a quite early [place] but they appear unrelated. BP is well controlled on procardia xl 60.  BMZ x 2 has been given    >Seizure activity:  MRI is normal.  EEG shows no epileptic activity.  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  Patient has history of childhood seizures, has been off of depakote for many years.  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  Neurology started lamictal 25 mg yesterday with no additional information, I suppose they felt it was best approach going forward.   pt received an IV dose Keppra on admission.  Anti anxiety management is also considered, can't take vistaril, consider ativan 0.5 mg BID initially if needed. Appreciate neurology's input and management!   Evolving clinical picture which I am afraid is pointing more and more to worsening pre  eclampsia, not in denial about the thrombocytopenia being related to the pre eclampsia but a dramatic drop, now stabilized in this setting.  HIT pending.      Amaryllis Dyke Ryott Rafferty 07/24/2021,8:50 AM

## 2021-07-25 ENCOUNTER — Encounter: Payer: Medicaid Other | Admitting: Advanced Practice Midwife

## 2021-07-25 DIAGNOSIS — R569 Unspecified convulsions: Secondary | ICD-10-CM | POA: Diagnosis not present

## 2021-07-25 DIAGNOSIS — Z3A24 24 weeks gestation of pregnancy: Secondary | ICD-10-CM

## 2021-07-25 LAB — HEPARIN INDUCED PLATELET AB (HIT ANTIBODY): Heparin Induced Plt Ab: 0.071 OD (ref 0.000–0.400)

## 2021-07-25 NOTE — Progress Notes (Signed)
Patient ID: Ephriam Jenkins, female   DOB: [birth year], 26 y.o.   MRN: [id number] Patient ID: RIDA BUTTREY, female   DOB: 23-Jun-1995, 26 y.o.   MRN: [id number] [place]) NOTE  EIMAN EHRGOTT is a 26 y.o. G1P0000 with Estimated Date of Delivery: 11/07/21   By  early ultrasound [redacted]w[redacted]d  who is admitted for seizure activity.    Fetal presentation is unsure. Length of Stay:  3  Days  Date of admission:07/21/2021  Subjective: No additional seizure activity since 7/15 am Back ache, chronic, improved No other complaints Patient reports the fetal movement as active. Patient reports uterine contraction  activity as none. Patient reports  vaginal bleeding as none. Patient describes fluid per vagina as None.  Vitals:  Blood pressure (!) 155/95, pulse 69, temperature 98.7 F (37.1 C), temperature source Oral, resp. rate 18, height 5\' 2"  (1.575 m), weight 117.7 kg, last menstrual period 01/24/2021, SpO2 100 %. Vitals:   07/24/21 1207 07/24/21 2004 07/25/21 0434 07/25/21 0700  BP: 135/77 132/74 132/80 (!) 155/95  Pulse: (!) 125 79 72 69  Resp: 18 17 17 18   Temp: 98 F (36.7 C) 97.6 F (36.4 C) 98.4 F (36.9 C) 98.7 F (37.1 C)  TempSrc: Oral Oral Oral Oral  SpO2: 98% 98% 98% 100%  Weight:      Height:       Physical Examination:  General appearance - alert, well appearing, and in no distress Abdomen - soft, nontender, nondistended, no masses or organomegaly +paraspinous tenderness Fundal Height:  size equals dates Pelvic Exam:  examination not indicated Cervical Exam: Not evaluated.  Extremities: extremities normal, atraumatic, no cyanosis or edema with DTRs 2+ bilaterally no clonus Membranes:intact  Fetal Monitoring:  Baseline: 150 bpm, Variability: Good {> 6 bpm), Accelerations: Non-reactive but appropriate for gestational age, and Decelerations: Absent   appropriate  Labs:      Latest Ref Rng & Units 07/24/2021    8:08 AM 07/23/2021    7:56 AM  07/23/2021    5:22 AM  CBC  WBC 4.0 - 10.5 K/uL 9.6  7.7  6.1   Hemoglobin 12.0 - 15.0 g/dL 12.1  11.8  11.3   Hematocrit 36.0 - 46.0 % 34.0  33.1  31.6   Platelets 150 - 400 K/uL 65  58  58        Latest Ref Rng & Units 07/24/2021    8:08 AM 07/23/2021    5:22 AM 07/22/2021   12:15 AM  CMP  Glucose 70 - 99 mg/dL 91  76  83   BUN 6 - 20 mg/dL 9  7  6    Creatinine 0.44 - 1.00 mg/dL 0.94  0.65  0.79   Sodium 135 - 145 mmol/L 135  132  138   Potassium 3.5 - 5.1 mmol/L 3.6  3.3  3.5   Chloride 98 - 111 mmol/L 106  107  110   CO2 22 - 32 mmol/L 20  19  20    Calcium 8.9 - 10.3 mg/dL 8.7  7.9  8.9   Total Protein 6.5 - 8.1 g/dL 5.5  4.7  5.5   Total Bilirubin 0.3 - 1.2 mg/dL 0.6  1.6  0.4   Alkaline Phos 38 - 126 U/L 67  60  69   AST 15 - 41 U/L 22  40  17   ALT 0 - 44 U/L 27  32  17     Imaging Studies:    MR CERVICAL SPINE  WO CONTRAST  Result Date: 07/23/2021 CLINICAL DATA:  Initial evaluation for acute myelopathy, left-sided weakness for several days. EXAM: MRI CERVICAL SPINE WITHOUT CONTRAST TECHNIQUE: Multiplanar, multisequence MR imaging of the cervical spine was performed. No intravenous contrast was administered. COMPARISON:  Prior CT from 12/15/2016. FINDINGS: Alignment: Examination moderately degraded by motion artifact. Straightening of the normal cervical lordosis.  No listhesis. Vertebrae: Vertebral body height maintained without acute or chronic fracture. Bone marrow signal intensity diffusely decreased on T1 weighted sequence, nonspecific, but most commonly related to anemia, smoking or obesity. No focal marrow replacing lesion. No abnormal marrow edema. Cord: Grossly normal signal and morphology on this motion degraded exam. Posterior Fossa, vertebral arteries, paraspinal tissues: Unremarkable. Disc levels: No significant disc pathology seen within the cervical spine. Intervertebral disc space height maintained with no appreciable significant disc bulge or focal disc herniation.  No significant facet pathology. No canal or neural foraminal stenosis. No impingement. IMPRESSION: 1. Motion degraded exam. 2. Grossly normal MRI of the cervical spine and spinal cord. No significant disc pathology, stenosis, or evidence for neural impingement. Electronically Signed   By: Rise Mu M.D.   On: 07/23/2021 03:30   MR BRAIN WO CONTRAST  Result Date: 07/23/2021 CLINICAL DATA:  Initial evaluation for several day history of left-sided weakness. EXAM: MRI HEAD WITHOUT CONTRAST TECHNIQUE: Multiplanar, multiecho pulse sequences of the brain and surrounding structures were obtained without intravenous contrast. COMPARISON:  Prior CT from 12/15/2016. FINDINGS: Brain: Examination mildly degraded by motion artifact. Cerebral volume within normal limits. No focal parenchymal signal abnormality or significant cerebral white matter disease. No abnormal foci of restricted diffusion to suggest acute or subacute ischemia. Gray-white matter differentiation maintained. No encephalomalacia to suggest prior cortical infarction or other insult. No foci of susceptibility artifact to suggest acute or chronic intracranial hemorrhage. No mass lesion, midline shift or mass effect. No hydrocephalus or extra-axial fluid collection. Pituitary gland and suprasellar region within normal limits. Midline structures intact and normally formed. Vascular: Major intracranial vascular flow voids are maintained. Skull and upper cervical spine: Craniocervical junction within normal limits. Bone marrow signal intensity diffusely decreased on T1 weighted sequence, nonspecific, but most commonly related to anemia, smoking or obesity. No focal marrow replacing lesion. No scalp soft tissue abnormality. Sinuses/Orbits: Globes orbital soft tissues within normal limits. Scattered mucosal thickening noted about the ethmoidal air cells and maxillary sinuses. No significant mastoid effusion. Other: None. IMPRESSION: Normal brain MRI. No  acute intracranial abnormality or findings to explain patient's symptoms. Electronically Signed   By: Rise Mu M.D.   On: 07/23/2021 03:24   Overnight EEG with video  Result Date: 07/22/2021 Charlsie Quest, MD     07/23/2021  8:48 AM Patient Name: ANNALUCIA LAINO MRN: [id number] Epilepsy Attending: Charlsie Quest Referring Physician/Provider: Rejeana Brock, MD Duration: 07/22/2021 0304 to 1141 Patient history:  26 year old female with a known history of psychogenic nonepileptic spells as well as a childhood history of epilepsy who presents with breakthrough episodes. EEG to evaluate for seizure. Level of alertness: Awake, asleep AEDs during EEG study: None Technical aspects: This EEG study was done with scalp electrodes positioned according to the 10-20 International system of electrode placement. Electrical activity was acquired at a sampling rate of 500Hz  and reviewed with a high frequency filter of 70Hz  and a low frequency filter of 1Hz . EEG data were recorded continuously and digitally stored. Description: The posterior dominant rhythm consists of 8-9Hz  activity of moderate voltage (25-35 uV) seen predominantly in posterior  head regions, symmetric and reactive to eye opening and eye closing. Sleep was characterized by vertex waves, sleep spindles (12 to 14 Hz), maximal frontocentral region.    Hyperventilation and photic stimulation were not performed.   One event was recorded on 7/15/223 at 0752 during which patient was laying in bed and had sudden violent whole body jerking for about 7-8 seconds. Concomitant eeg before, during and after the event didn't show any eeg change to suggest seizure. IMPRESSION: This study is within normal limits. No seizures or epileptiform discharges were seen throughout the recording. One event was recorded on 7/15/223 at 0752 as describe above without concomitant eeg change. This was a NON-epileptic event. Priyanka Annabelle Harman     Medications:  Scheduled   aspirin EC  81 mg Oral Daily   budesonide  0.5 mg Nebulization BID   docusate sodium  100 mg Oral Daily   fluticasone  1 spray Each Nare Daily   lamoTRIgine  25 mg Oral Daily   NIFEdipine  60 mg Oral Daily   prenatal multivitamin  1 tablet Oral Q1200   I have reviewed the patient's current medications.  ASSESSMENT + Plan: No change in care plan G1P0000 [redacted]w[redacted]d Estimated Date of Delivery: 11/07/21    >Pre eclampsia,  LFTs normal, creatinine has ^from .65 to 0.94, Platelets remain low, but stable lovenox stopped in case it it platelet associated antibodies, it is difficult to reconcile that this sudden, precipitous drop is pre eclampsia related, but we are not naive to the fact it could be.  Pr/Cr is 8.56--> 24 hour urine protein 8500 mg/24 hours BP is creeping up a bit this am, may need additional anti hypertensive  No evidence of PRES on MRI noted. Do not think her seizure activity has anything to do with her hypertensive disorder of pregnancy at this point, would not have resolved and clinical status would have clinically deteriorated.  So true she had non epileptic seizures, true she has evolved to pre eclampsia, at a quite early [place] but they appear unrelated.   BMZ x 2 has been given    >Seizure activity:  MRI is normal.  EEG shows no epileptic activity.  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  Patient has history of childhood seizures, has been off of depakote for many years.  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  Neurology started lamictal 25 mg yesterday with no additional information, I suppose they felt it was best approach going forward.   pt received an IV dose Keppra on admission.  Anti anxiety management is also considered, can't take vistaril, consider ativan 0.5 mg BID initially if needed. Appreciate neurology's input and management!   Evolving clinical picture which I am afraid is pointing more and more to worsening pre eclampsia, not in  denial about the thrombocytopenia being related to the pre eclampsia but a dramatic drop, now stabilized in this setting.  HIT pending.  BP trending up. Anticipate continued deterioration in her pre eclampsia status.  Labs tomorrow    Lazaro Arms 07/25/2021,8:52 AM    Patient ID: [name], female   DOB: [birth year], 26 y.o.   MRN: [id number]

## 2021-07-25 NOTE — Progress Notes (Signed)
Pt states she does not want to monitor her baby until 6pm after she gets back from spending some time outside.

## 2021-07-25 NOTE — Progress Notes (Signed)
Pt still not back from being outside. Will monitor her baby when she returns to the unit.

## 2021-07-26 ENCOUNTER — Encounter (HOSPITAL_COMMUNITY): Payer: Self-pay | Admitting: Obstetrics & Gynecology

## 2021-07-26 ENCOUNTER — Inpatient Hospital Stay (HOSPITAL_BASED_OUTPATIENT_CLINIC_OR_DEPARTMENT_OTHER): Payer: Medicaid Other

## 2021-07-26 ENCOUNTER — Inpatient Hospital Stay (HOSPITAL_COMMUNITY): Payer: Medicaid Other

## 2021-07-26 DIAGNOSIS — R569 Unspecified convulsions: Secondary | ICD-10-CM | POA: Diagnosis not present

## 2021-07-26 DIAGNOSIS — O285 Abnormal chromosomal and genetic finding on antenatal screening of mother: Secondary | ICD-10-CM

## 2021-07-26 DIAGNOSIS — O99350 Diseases of the nervous system complicating pregnancy, unspecified trimester: Secondary | ICD-10-CM | POA: Diagnosis not present

## 2021-07-26 DIAGNOSIS — Z148 Genetic carrier of other disease: Secondary | ICD-10-CM

## 2021-07-26 DIAGNOSIS — O1422 HELLP syndrome (HELLP), second trimester: Secondary | ICD-10-CM

## 2021-07-26 DIAGNOSIS — Z3A25 25 weeks gestation of pregnancy: Secondary | ICD-10-CM | POA: Diagnosis not present

## 2021-07-26 DIAGNOSIS — O99212 Obesity complicating pregnancy, second trimester: Secondary | ICD-10-CM

## 2021-07-26 DIAGNOSIS — G40909 Epilepsy, unspecified, not intractable, without status epilepticus: Secondary | ICD-10-CM

## 2021-07-26 DIAGNOSIS — O1412 Severe pre-eclampsia, second trimester: Secondary | ICD-10-CM

## 2021-07-26 LAB — COMPREHENSIVE METABOLIC PANEL
ALT: 78 U/L — ABNORMAL HIGH (ref 0–44)
ALT: 77 U/L — ABNORMAL HIGH (ref 0–44)
ALT: 70 U/L — ABNORMAL HIGH (ref 0–44)
AST: 59 U/L — ABNORMAL HIGH (ref 15–41)
AST: 53 U/L — ABNORMAL HIGH (ref 15–41)
AST: 48 U/L — ABNORMAL HIGH (ref 15–41)
Albumin: 2.2 g/dL — ABNORMAL LOW (ref 3.5–5.0)
Albumin: 2.5 g/dL — ABNORMAL LOW (ref 3.5–5.0)
Albumin: 2.3 g/dL — ABNORMAL LOW (ref 3.5–5.0)
Alkaline Phosphatase: 60 U/L (ref 38–126)
Alkaline Phosphatase: 61 U/L (ref 38–126)
Alkaline Phosphatase: 62 U/L (ref 38–126)
Anion gap: 4 — ABNORMAL LOW (ref 5–15)
Anion gap: 7 (ref 5–15)
Anion gap: 7 (ref 5–15)
BUN: 7 mg/dL (ref 6–20)
BUN: 8 mg/dL (ref 6–20)
BUN: 6 mg/dL (ref 6–20)
CO2: 22 mmol/L (ref 22–32)
CO2: 20 mmol/L — ABNORMAL LOW (ref 22–32)
CO2: 21 mmol/L — ABNORMAL LOW (ref 22–32)
Calcium: 8.3 mg/dL — ABNORMAL LOW (ref 8.9–10.3)
Calcium: 8.3 mg/dL — ABNORMAL LOW (ref 8.9–10.3)
Calcium: 8 mg/dL — ABNORMAL LOW (ref 8.9–10.3)
Chloride: 111 mmol/L (ref 98–111)
Chloride: 109 mmol/L (ref 98–111)
Chloride: 107 mmol/L (ref 98–111)
Creatinine, Ser: 0.91 mg/dL (ref 0.44–1.00)
Creatinine, Ser: 0.79 mg/dL (ref 0.44–1.00)
Creatinine, Ser: 0.82 mg/dL (ref 0.44–1.00)
GFR, Estimated: >60 mL/min (ref >60)
GFR, Estimated: >60 mL/min (ref >60)
GFR, Estimated: >60 mL/min (ref >60)
Glucose, Bld: 82 mg/dL (ref 70–99)
Glucose, Bld: 87 mg/dL (ref 70–99)
Glucose, Bld: 82 mg/dL (ref 70–99)
Potassium: 3.9 mmol/L (ref 3.5–5.1)
Potassium: 3.7 mmol/L (ref 3.5–5.1)
Potassium: 3.4 mmol/L — ABNORMAL LOW (ref 3.5–5.1)
Sodium: 137 mmol/L (ref 135–145)
Sodium: 136 mmol/L (ref 135–145)
Sodium: 135 mmol/L (ref 135–145)
Total Bilirubin: 0.6 mg/dL (ref 0.3–1.2)
Total Bilirubin: 0.5 mg/dL (ref 0.3–1.2)
Total Bilirubin: 0.8 mg/dL (ref 0.3–1.2)
Total Protein: 4.7 g/dL — ABNORMAL LOW (ref 6.5–8.1)
Total Protein: 5.4 g/dL — ABNORMAL LOW (ref 6.5–8.1)
Total Protein: 4.9 g/dL — ABNORMAL LOW (ref 6.5–8.1)

## 2021-07-26 LAB — CBC
HCT: 31.2 % — ABNORMAL LOW (ref 36.0–46.0)
HCT: 32.2 % — ABNORMAL LOW (ref 36.0–46.0)
HCT: 30.5 % — ABNORMAL LOW (ref 36.0–46.0)
Hemoglobin: 10.6 g/dL — ABNORMAL LOW (ref 12.0–15.0)
Hemoglobin: 11.1 g/dL — ABNORMAL LOW (ref 12.0–15.0)
Hemoglobin: 10.8 g/dL — ABNORMAL LOW (ref 12.0–15.0)
MCH: 28 pg (ref 26.0–34.0)
MCH: 28.2 pg (ref 26.0–34.0)
MCH: 28.4 pg (ref 26.0–34.0)
MCHC: 34 g/dL (ref 30.0–36.0)
MCHC: 34.5 g/dL (ref 30.0–36.0)
MCHC: 35.4 g/dL (ref 30.0–36.0)
MCV: 82.5 fL (ref 80.0–100.0)
MCV: 81.9 fL (ref 80.0–100.0)
MCV: 80.3 fL (ref 80.0–100.0)
Platelets: 83 10*3/uL — ABNORMAL LOW (ref 150–400)
Platelets: 87 10*3/uL — ABNORMAL LOW (ref 150–400)
Platelets: 79 10*3/uL — ABNORMAL LOW (ref 150–400)
RBC: 3.78 MIL/uL — ABNORMAL LOW (ref 3.87–5.11)
RBC: 3.93 MIL/uL (ref 3.87–5.11)
RBC: 3.8 MIL/uL — ABNORMAL LOW (ref 3.87–5.11)
RDW: 14.6 % (ref 11.5–15.5)
RDW: 14.1 % (ref 11.5–15.5)
RDW: 14.1 % (ref 11.5–15.5)
WBC: 9.8 10*3/uL (ref 4.0–10.5)
WBC: 11.9 10*3/uL — ABNORMAL HIGH (ref 4.0–10.5)
WBC: 12.4 10*3/uL — ABNORMAL HIGH (ref 4.0–10.5)
nRBC: 0.4 % — ABNORMAL HIGH (ref 0.0–0.2)
nRBC: 0.3 % — ABNORMAL HIGH (ref 0.0–0.2)
nRBC: 0.2 % (ref 0.0–0.2)

## 2021-07-26 MED ORDER — SODIUM CHLORIDE 0.9 % IV SOLN: INTRAVENOUS | Status: DC

## 2021-07-26 MED ORDER — MAGNESIUM SULFATE BOLUS VIA INFUSION
4.0000 g | Freq: Once | INTRAVENOUS | Status: AC
  Administered 2021-07-26: 4 g via INTRAVENOUS
  Filled 2021-07-26: qty 1000

## 2021-07-26 MED ORDER — CEFAZOLIN SODIUM-DEXTROSE 2-4 GM/100ML-% IV SOLN
2.0000 g | INTRAVENOUS | Status: AC
  Administered 2021-07-27: 2 g via INTRAVENOUS
  Filled 2021-07-26: qty 100

## 2021-07-26 MED ORDER — MAGNESIUM SULFATE 40 GM/1000ML IV SOLN
2.0000 g/h | INTRAVENOUS | Status: AC
  Administered 2021-07-26 – 2021-07-28 (×3): 2 g/h via INTRAVENOUS
  Filled 2021-07-26 (×3): qty 1000

## 2021-07-26 NOTE — Consult Note (Signed)
MFM Note  Lisa [name] is a 26 year old gravida 1 para 0 currently at 25 weeks and 1 day.  She was seen in consultation at the request of Dr. Alysia Penna due to severe preeclampsia/HELLP syndrome.    The patient was admitted a few days ago due to seizures.  She has a history of seizure disorders that are not treated with any antiepileptic medications.    She was seen by neurology who performed an EEG that was within normal limits.  She also had an MRI of the brain that did not show any acute abnormalities.  Her current pregnancy has also been complicated by gestational hypertension that has been treated with Procardia.    Her PIH labs at the time of admission were within normal limits.  The patient was placed on Lovenox for DVT prophylaxis at the time of admission.  She received a complete course of antenatal corticosteroids at the time of admission.  Starting about 3 days ago, her platelet counts were noted to be low (58,000).    Due to the possibility of heparin-induced thrombocytopenia, her Lovenox was discontinued.  A subsequent work-up showed that her heparin-induced thrombocytopenia antibodies were negative.  Her PIH labs from earlier this morning showed elevated liver function tests (AST 59 and ALT 78) along with increasing serum creatinine levels (0.91).  Her platelet count today was 83,000.  She had a 24-hour urine that showed 8547 mg of protein confirming the diagnosis of preeclampsia/HELLP syndrome.  Her most recent EFW performed about 2 weeks ago was 490 g (1 pound 1 ounces).  There was normal amniotic fluid noted.  She is currently asymptomatic.  The implications and management of preeclampsia was discussed with the patient.    She was advised that preeclampsia can affect both the mother and the fetus.     In the mother, preeclampsia may cause a rise in blood pressures and it can affect the mother's kidney, liver, and platelet functions.  It may also cause the mother to have  seizures.     In the fetus, it may cause growth restriction and oligohydramnios.     She understands that delivery is the only treatment for preeclampsia.  Due to severe preeclampsia/HELLP syndrome, we will repeat PIH labs every 6 hours in an attempt to delay delivery.  The patient understands that she will most likely require delivery either later today or sometime tomorrow.    At her current gestational age, there may be improved neonatal outcomes even if we can delay delivery for another day or two.  She should be started on magnesium sulfate for maternal seizure prophylaxis and fetal neuroprotection.  We will await the results of her repeat PIH labs.  Should her platelet count and liver function tests remain stable, delivery may be delayed until further worsening of her lab work (platelets less than 70,000, LFTs in 100s range, or serum creatinine levels greater than 1.2).  The patient and her mother understand that I anticipate that she will most likely require delivery by tomorrow.    They understand that the baby will require a NICU admission after delivery.  We have contacted the NICU and they have made room for her baby.  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  The patient and her mother stated that all of their questions were answered today.

## 2021-07-26 NOTE — Consult Note (Signed)
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  She was focused on questions about her own health and was asking about having her tubes tied after delivery.  I told her she should discuss with her ob doctors.  I can return when her mother is here to provide additional support to the family.  I told her I can return if she or her mother have any questions about our probable care for baby after delivery.  R.L. Patterson Hammersmith, M.D.

## 2021-07-26 NOTE — Progress Notes (Signed)
Patient began having pseudoseizure at 2005. RN at bedside. Per Mother of patient, signs align with patient's usual seizure patterns. Patient alert, follows commands, vitals within patient baseline. MD called to bedside. RN and MD at bedside adjusting fetal monitors. Seizure completed at 2030. FHR baseline 135, minimal to moderate variability, and intermittent variable decelerations.  Raelyn Ensign, RN

## 2021-07-26 NOTE — Progress Notes (Signed)
MFM Note  The patient has now developed severe preeclampsia/HELLP syndrome.  On today's ultrasound exam, the overall EFW was 515 g (1 pound 2 ounces, less than the 1st percentile).  There was normal amniotic fluid with a maximal vertical pocket of 3.46 cm.    Reversed end-diastolic flow was noted on the umbilical artery Doppler studies.    The patient's PIH labs are currently stable.    Due to reversed end-diastolic flow noted on her umbilical artery Doppler studies, she should remain on continuous monitoring overnight.    Delivery should probably occur tomorrow or sooner for nonreassuring fetal status or worsening of her PIH labs.    She should remain on magnesium sulfate for maternal seizure prophylaxis and fetal neuroprotection.

## 2021-07-26 NOTE — Progress Notes (Signed)
Patient ID: Lisa [name], female   DOB: 10/01/1995, 26 y.o.   MRN: [id number] 1500 labs are stable. Will continue with q 6 hr labs. Magnesium and NPO  Proceed toward delivery as previously outlined.

## 2021-07-26 NOTE — Progress Notes (Signed)
Patient ID: Lisa [name], female   DOB: [birth year], 26 y.o.   MRN: [id number] Patient experienced another episode of seizure-like activity which resolved quickly and she feels better. Chest pain resolved and pO2 is 100%. FHR stable 130=140's. Continuous monitoring and magnesium as ordered.  Adam Phenix, MD

## 2021-07-26 NOTE — Progress Notes (Signed)
Patient ID: [name], female   DOB: [birth year], 26 y.o.   MRN: [id number] [facility] PRACTICE ANTEPARTUM COMPREHENSIVE PROGRESS NOTE  NATOSHA BOU is a 26 y.o. G1P0000 at [redacted]w[redacted]d  who is admitted for [facility] and HELLp in setting of IUP 25 weeks.   Fetal presentation is unsure. Length of Stay:  4  Days  Subjective: Pt denies HA or blurry vision. Tolerating diet Patient reports good fetal movement.  She reports no uterine contractions, no bleeding and no loss of fluid per vagina.  Vitals:  Blood pressure (!) 151/94, pulse 61, temperature 98.4 F (36.9 C), temperature source Oral, resp. rate 18, height 5\' 2"  (1.575 m), weight 117.7 kg, last menstrual period 01/24/2021, SpO2 99 %.  Physical Examination: Lungs clear Heart RRR Abd soft + BS gravid Ext non tender   Fetal Monitoring:  Baseline: 160's  bpm  Labs:  Results for orders placed or performed during the hospital encounter of 07/21/21 (from the past 24 hour(s))  CBC   Collection Time: 07/26/21  5:27 AM  Result Value Ref Range   WBC 9.8 4.0 - 10.5 K/uL   RBC 3.78 (L) 3.87 - 5.11 MIL/uL   Hemoglobin 10.6 (L) 12.0 - 15.0 g/dL   HCT 07/28/21 (L) 06.2 - 69.4 %   MCV 82.5 80.0 - 100.0 fL   MCH 28.0 26.0 - 34.0 pg   MCHC 34.0 30.0 - 36.0 g/dL   RDW 85.4 62.7 - 03.5 %   Platelets 83 (L) 150 - 400 K/uL   nRBC 0.4 (H) 0.0 - 0.2 %  Comprehensive metabolic panel   Collection Time: 07/26/21  5:27 AM  Result Value Ref Range   Sodium 137 135 - 145 mmol/L   Potassium 3.9 3.5 - 5.1 mmol/L   Chloride 111 98 - 111 mmol/L   CO2 22 22 - 32 mmol/L   Glucose, Bld 82 70 - 99 mg/dL   BUN 7 6 - 20 mg/dL   Creatinine, Ser 07/28/21 0.44 - 1.00 mg/dL   Calcium 8.3 (L) 8.9 - 10.3 mg/dL   Total Protein 4.7 (L) 6.5 - 8.1 g/dL   Albumin 2.2 (L) 3.5 - 5.0 g/dL   AST 59 (H) 15 - 41 U/L   ALT 78 (H) 0 - 44 U/L   Alkaline Phosphatase 60 38 - 126 U/L   Total Bilirubin 0.6 0.3 - 1.2 mg/dL   GFR, Estimated 3.81 >82 mL/min   Anion gap 4 (L) 5 - 15    Imaging  Studies:    NA   Medications:  Scheduled  aspirin EC  81 mg Oral Daily   budesonide  0.5 mg Nebulization BID   docusate sodium  100 mg Oral Daily   fluticasone  1 spray Each Nare Daily   lamoTRIgine  25 mg Oral Daily   NIFEdipine  60 mg Oral Daily   prenatal multivitamin  1 tablet Oral Q1200   I have reviewed the patient's current medications.  ASSESSMENT: IUP 25 1/7 weeks SPEC with HELLP Non epileptic SZ  PLAN: LFT's now elevated. HIT antibodies negative. Confirmation that we are dealing with SPEC with HELLP. Will check labs q 6 hours. NPO. Magnesium therapy. NICU and MFM consult.   Lengthily discussion with pt and mother regarding Dx and POC.  Will proceed toward delivery if LFT's significantly increase ( > 100's) Or as needed per maternal/fetal indications.  Discussed need for c section for delivery due to early gestation age. Potential for GETA vs spinal reviewed pending NPO  status. Potential need for LTCS vs classical c section reviewed and would be surgeon dependent.  Pt and mother questions answered. Continue to monitor closely   Hermina Staggers 07/26/2021,11:13 AM

## 2021-07-27 ENCOUNTER — Inpatient Hospital Stay (HOSPITAL_COMMUNITY): Payer: Medicaid Other | Admitting: Anesthesiology

## 2021-07-27 ENCOUNTER — Encounter (HOSPITAL_COMMUNITY): Payer: Self-pay | Admitting: Obstetrics & Gynecology

## 2021-07-27 ENCOUNTER — Encounter: Payer: Self-pay | Admitting: Family Medicine

## 2021-07-27 ENCOUNTER — Encounter (HOSPITAL_COMMUNITY)
Admission: EM | Disposition: A | Discharge status: Home / Self Care | Payer: Self-pay | Source: Home / Self Care | Attending: Obstetrics and Gynecology

## 2021-07-27 ENCOUNTER — Other Ambulatory Visit: Payer: Self-pay

## 2021-07-27 DIAGNOSIS — O1424 HELLP syndrome, complicating childbirth: Secondary | ICD-10-CM

## 2021-07-27 DIAGNOSIS — R569 Unspecified convulsions: Secondary | ICD-10-CM | POA: Diagnosis not present

## 2021-07-27 DIAGNOSIS — O1420 HELLP syndrome (HELLP), unspecified trimester: Secondary | ICD-10-CM

## 2021-07-27 DIAGNOSIS — O365929 Maternal care for other known or suspected poor fetal growth, second trimester, other fetus: Secondary | ICD-10-CM

## 2021-07-27 DIAGNOSIS — Z98891 History of uterine scar from previous surgery: Secondary | ICD-10-CM

## 2021-07-27 DIAGNOSIS — Z3A25 25 weeks gestation of pregnancy: Secondary | ICD-10-CM

## 2021-07-27 DIAGNOSIS — Z9079 Acquired absence of other genital organ(s): Secondary | ICD-10-CM

## 2021-07-27 DIAGNOSIS — O1414 Severe pre-eclampsia complicating childbirth: Secondary | ICD-10-CM

## 2021-07-27 DIAGNOSIS — Z302 Encounter for sterilization: Secondary | ICD-10-CM

## 2021-07-27 DIAGNOSIS — O365930 Maternal care for other known or suspected poor fetal growth, third trimester, not applicable or unspecified: Secondary | ICD-10-CM

## 2021-07-27 LAB — CBC
HCT: 31.4 % — ABNORMAL LOW (ref 36.0–46.0)
Hemoglobin: 11.4 g/dL — ABNORMAL LOW (ref 12.0–15.0)
MCH: 28.9 pg (ref 26.0–34.0)
MCHC: 36.3 g/dL — ABNORMAL HIGH (ref 30.0–36.0)
MCV: 79.7 fL — ABNORMAL LOW (ref 80.0–100.0)
Platelets: 76 10*3/uL — ABNORMAL LOW (ref 150–400)
RBC: 3.94 MIL/uL (ref 3.87–5.11)
RDW: 14 % (ref 11.5–15.5)
WBC: 11.3 10*3/uL — ABNORMAL HIGH (ref 4.0–10.5)
nRBC: 0.2 % (ref 0.0–0.2)

## 2021-07-27 LAB — COMPREHENSIVE METABOLIC PANEL
ALT: 65 U/L — ABNORMAL HIGH (ref 0–44)
ALT: 69 U/L — ABNORMAL HIGH (ref 0–44)
AST: 45 U/L — ABNORMAL HIGH (ref 15–41)
AST: 46 U/L — ABNORMAL HIGH (ref 15–41)
Albumin: 2.3 g/dL — ABNORMAL LOW (ref 3.5–5.0)
Albumin: 2.6 g/dL — ABNORMAL LOW (ref 3.5–5.0)
Alkaline Phosphatase: 61 U/L (ref 38–126)
Alkaline Phosphatase: 75 U/L (ref 38–126)
Anion gap: 7 (ref 5–15)
Anion gap: 9 (ref 5–15)
BUN: 7 mg/dL (ref 6–20)
BUN: 6 mg/dL (ref 6–20)
CO2: 20 mmol/L — ABNORMAL LOW (ref 22–32)
CO2: 23 mmol/L (ref 22–32)
Calcium: 7.6 mg/dL — ABNORMAL LOW (ref 8.9–10.3)
Calcium: 7.7 mg/dL — ABNORMAL LOW (ref 8.9–10.3)
Chloride: 107 mmol/L (ref 98–111)
Chloride: 102 mmol/L (ref 98–111)
Creatinine, Ser: 0.78 mg/dL (ref 0.44–1.00)
Creatinine, Ser: 0.87 mg/dL (ref 0.44–1.00)
GFR, Estimated: >60 mL/min (ref >60)
GFR, Estimated: >60 mL/min (ref >60)
Glucose, Bld: 86 mg/dL (ref 70–99)
Glucose, Bld: 83 mg/dL (ref 70–99)
Potassium: 3.6 mmol/L (ref 3.5–5.1)
Potassium: 3.3 mmol/L — ABNORMAL LOW (ref 3.5–5.1)
Sodium: 134 mmol/L — ABNORMAL LOW (ref 135–145)
Sodium: 134 mmol/L — ABNORMAL LOW (ref 135–145)
Total Bilirubin: 0.7 mg/dL (ref 0.3–1.2)
Total Bilirubin: 1 mg/dL (ref 0.3–1.2)
Total Protein: 5 g/dL — ABNORMAL LOW (ref 6.5–8.1)
Total Protein: 5.6 g/dL — ABNORMAL LOW (ref 6.5–8.1)

## 2021-07-27 LAB — CBC WITH DIFFERENTIAL/PLATELET
Abs Immature Granulocytes: 0.2 10*3/uL — ABNORMAL HIGH (ref 0.00–0.07)
Basophils Absolute: 0 10*3/uL (ref 0.0–0.1)
Basophils Relative: 0 %
Eosinophils Absolute: 0 10*3/uL (ref 0.0–0.5)
Eosinophils Relative: 0 %
HCT: 36.1 % (ref 36.0–46.0)
Hemoglobin: 13 g/dL (ref 12.0–15.0)
Immature Granulocytes: 2 %
Lymphocytes Relative: 24 %
Lymphs Abs: 2.8 10*3/uL (ref 0.7–4.0)
MCH: 28.8 pg (ref 26.0–34.0)
MCHC: 36 g/dL (ref 30.0–36.0)
MCV: 80 fL (ref 80.0–100.0)
Monocytes Absolute: 0.9 10*3/uL (ref 0.1–1.0)
Monocytes Relative: 8 %
Neutro Abs: 7.6 10*3/uL (ref 1.7–7.7)
Neutrophils Relative %: 66 %
Platelets: 77 10*3/uL — ABNORMAL LOW (ref 150–400)
RBC: 4.51 MIL/uL (ref 3.87–5.11)
RDW: 14.2 % (ref 11.5–15.5)
WBC: 11.7 10*3/uL — ABNORMAL HIGH (ref 4.0–10.5)
nRBC: 0.2 % (ref 0.0–0.2)

## 2021-07-27 LAB — TYPE AND SCREEN
ABO/RH(D): B POS
Antibody Screen: NEGATIVE

## 2021-07-27 LAB — RPR: RPR Ser Ql: NONREACTIVE

## 2021-07-27 SURGERY — Surgical Case
Anesthesia: General

## 2021-07-27 MED ORDER — DEXAMETHASONE SODIUM PHOSPHATE 4 MG/ML IJ SOLN
INTRAMUSCULAR | Status: DC | PRN
  Administered 2021-07-27: 4 mg via INTRAVENOUS

## 2021-07-27 MED ORDER — BISACODYL 10 MG RE SUPP: 10.0000 mg | Freq: Every day | RECTAL | Status: DC | PRN

## 2021-07-27 MED ORDER — ACETAMINOPHEN 500 MG PO TABS
1000.0000 mg | ORAL_TABLET | Freq: Four times a day (QID) | ORAL | Status: DC
  Administered 2021-07-27 – 2021-07-29 (×5): 1000 mg via ORAL
  Filled 2021-07-27 (×6): qty 2

## 2021-07-27 MED ORDER — HYDROMORPHONE HCL 2 MG PO TABS: 2.0000 mg | ORAL_TABLET | ORAL | Status: DC | PRN

## 2021-07-27 MED ORDER — ROCURONIUM BROMIDE 10 MG/ML (PF) SYRINGE
PREFILLED_SYRINGE | INTRAVENOUS | Status: AC
  Filled 2021-07-27: qty 10

## 2021-07-27 MED ORDER — LIDOCAINE HCL (CARDIAC) PF 100 MG/5ML IV SOSY
PREFILLED_SYRINGE | INTRAVENOUS | Status: DC | PRN
  Administered 2021-07-27: 100 mg via INTRAVENOUS

## 2021-07-27 MED ORDER — FENTANYL CITRATE (PF) 100 MCG/2ML IJ SOLN
INTRAMUSCULAR | Status: DC | PRN
  Administered 2021-07-27: 125 ug via INTRAVENOUS
  Administered 2021-07-27: 50 ug via INTRAVENOUS
  Administered 2021-07-27: 75 ug via INTRAVENOUS

## 2021-07-27 MED ORDER — LACTATED RINGERS IV SOLN: INTRAVENOUS | Status: DC

## 2021-07-27 MED ORDER — HYDROMORPHONE HCL 1 MG/ML IJ SOLN
INTRAMUSCULAR | Status: AC
  Filled 2021-07-27: qty 2

## 2021-07-27 MED ORDER — HYDROMORPHONE HCL 1 MG/ML IJ SOLN
INTRAMUSCULAR | Status: AC
  Filled 2021-07-27: qty 0.5

## 2021-07-27 MED ORDER — IBUPROFEN 600 MG PO TABS
600.0000 mg | ORAL_TABLET | Freq: Four times a day (QID) | ORAL | Status: DC
  Administered 2021-07-28 – 2021-07-29 (×3): 600 mg via ORAL
  Filled 2021-07-27 (×3): qty 1

## 2021-07-27 MED ORDER — KETOROLAC TROMETHAMINE 30 MG/ML IJ SOLN
30.0000 mg | Freq: Four times a day (QID) | INTRAMUSCULAR | Status: AC
  Administered 2021-07-27 – 2021-07-28 (×4): 30 mg via INTRAVENOUS
  Filled 2021-07-27 (×5): qty 1

## 2021-07-27 MED ORDER — HYDROMORPHONE HCL 1 MG/ML IJ SOLN
2.0000 mg | INTRAMUSCULAR | Status: DC | PRN
  Administered 2021-07-27: 2 mg via INTRAVENOUS
  Filled 2021-07-27: qty 2

## 2021-07-27 MED ORDER — TRANEXAMIC ACID-NACL 1000-0.7 MG/100ML-% IV SOLN
INTRAVENOUS | Status: AC
  Filled 2021-07-27: qty 100

## 2021-07-27 MED ORDER — OXYTOCIN-SODIUM CHLORIDE 30-0.9 UT/500ML-% IV SOLN
INTRAVENOUS | Status: AC
  Filled 2021-07-27: qty 500

## 2021-07-27 MED ORDER — PROPOFOL 10 MG/ML IV BOLUS
INTRAVENOUS | Status: AC
  Filled 2021-07-27: qty 40

## 2021-07-27 MED ORDER — CEFAZOLIN SODIUM-DEXTROSE 2-4 GM/100ML-% IV SOLN
INTRAVENOUS | Status: AC
  Filled 2021-07-27: qty 100

## 2021-07-27 MED ORDER — SUGAMMADEX SODIUM 500 MG/5ML IV SOLN
INTRAVENOUS | Status: DC | PRN
  Administered 2021-07-27: 450 mg via INTRAVENOUS

## 2021-07-27 MED ORDER — SUGAMMADEX SODIUM 500 MG/5ML IV SOLN
INTRAVENOUS | Status: AC
  Filled 2021-07-27: qty 5

## 2021-07-27 MED ORDER — PROPOFOL 10 MG/ML IV BOLUS
INTRAVENOUS | Status: DC | PRN
  Administered 2021-07-27: 180 mg via INTRAVENOUS

## 2021-07-27 MED ORDER — SOD CITRATE-CITRIC ACID 500-334 MG/5ML PO SOLN
ORAL | Status: AC
  Administered 2021-07-27: 30 mL
  Filled 2021-07-27: qty 30

## 2021-07-27 MED ORDER — HYDROMORPHONE HCL 1 MG/ML IJ SOLN
0.2500 mg | INTRAMUSCULAR | Status: DC | PRN
  Administered 2021-07-27: 0.5 mg via INTRAVENOUS

## 2021-07-27 MED ORDER — OXYCODONE HCL 5 MG PO TABS: 5.0000 mg | ORAL_TABLET | Freq: Once | ORAL | Status: DC | PRN

## 2021-07-27 MED ORDER — ENOXAPARIN SODIUM 60 MG/0.6ML IJ SOSY
60.0000 mg | PREFILLED_SYRINGE | INTRAMUSCULAR | Status: DC
  Administered 2021-07-29: 60 mg via SUBCUTANEOUS
  Filled 2021-07-27 (×3): qty 0.6

## 2021-07-27 MED ORDER — SIMETHICONE 80 MG PO CHEW
80.0000 mg | CHEWABLE_TABLET | Freq: Three times a day (TID) | ORAL | Status: DC
  Administered 2021-07-27 – 2021-07-29 (×4): 80 mg via ORAL
  Filled 2021-07-27 (×5): qty 1

## 2021-07-27 MED ORDER — SUCCINYLCHOLINE CHLORIDE 200 MG/10ML IV SOSY
PREFILLED_SYRINGE | INTRAVENOUS | Status: DC | PRN
  Administered 2021-07-27: 120 mg via INTRAVENOUS

## 2021-07-27 MED ORDER — SIMETHICONE 80 MG PO CHEW: 80.0000 mg | CHEWABLE_TABLET | ORAL | Status: DC | PRN

## 2021-07-27 MED ORDER — OXYTOCIN-SODIUM CHLORIDE 30-0.9 UT/500ML-% IV SOLN: 2.5000 [IU]/h | INTRAVENOUS | Status: AC

## 2021-07-27 MED ORDER — AMISULPRIDE (ANTIEMETIC) 5 MG/2ML IV SOLN: 10.0000 mg | Freq: Once | INTRAVENOUS | Status: DC | PRN

## 2021-07-27 MED ORDER — PRENATAL MULTIVITAMIN CH
1.0000 | ORAL_TABLET | Freq: Every day | ORAL | Status: DC
  Administered 2021-07-27 – 2021-07-29 (×3): 1 via ORAL
  Filled 2021-07-27 (×3): qty 1

## 2021-07-27 MED ORDER — HYDROMORPHONE HCL 1 MG/ML IJ SOLN
INTRAMUSCULAR | Status: DC | PRN
  Administered 2021-07-27 (×2): 1 mg via INTRAVENOUS

## 2021-07-27 MED ORDER — MIDAZOLAM HCL 2 MG/2ML IJ SOLN
INTRAMUSCULAR | Status: AC
  Filled 2021-07-27: qty 2

## 2021-07-27 MED ORDER — ONDANSETRON HCL 4 MG/2ML IJ SOLN: 4.0000 mg | Freq: Once | INTRAMUSCULAR | Status: DC | PRN

## 2021-07-27 MED ORDER — DIPHENHYDRAMINE HCL 25 MG PO CAPS: 25.0000 mg | ORAL_CAPSULE | Freq: Four times a day (QID) | ORAL | Status: DC | PRN

## 2021-07-27 MED ORDER — MENTHOL 3 MG MT LOZG: 1.0000 | LOZENGE | OROMUCOSAL | Status: DC | PRN

## 2021-07-27 MED ORDER — LIDOCAINE HCL 1 % IJ SOLN
INTRAMUSCULAR | Status: AC
  Filled 2021-07-27: qty 20

## 2021-07-27 MED ORDER — SCOPOLAMINE 1 MG/3DAYS TD PT72
MEDICATED_PATCH | TRANSDERMAL | Status: DC | PRN
  Administered 2021-07-27: 1 via TRANSDERMAL

## 2021-07-27 MED ORDER — ONDANSETRON HCL 4 MG/2ML IJ SOLN
INTRAMUSCULAR | Status: DC | PRN
  Administered 2021-07-27: 4 mg via INTRAVENOUS

## 2021-07-27 MED ORDER — SCOPOLAMINE 1 MG/3DAYS TD PT72
MEDICATED_PATCH | TRANSDERMAL | Status: AC
  Filled 2021-07-27: qty 1

## 2021-07-27 MED ORDER — ACETAMINOPHEN 10 MG/ML IV SOLN: 1000.0000 mg | Freq: Once | INTRAVENOUS | Status: DC | PRN

## 2021-07-27 MED ORDER — MEASLES, MUMPS & RUBELLA VAC IJ SOLR: 0.5000 mL | Freq: Once | INTRAMUSCULAR | Status: DC

## 2021-07-27 MED ORDER — SUCCINYLCHOLINE CHLORIDE 200 MG/10ML IV SOSY
PREFILLED_SYRINGE | INTRAVENOUS | Status: AC
  Filled 2021-07-27: qty 10

## 2021-07-27 MED ORDER — OXYTOCIN-SODIUM CHLORIDE 30-0.9 UT/500ML-% IV SOLN
INTRAVENOUS | Status: DC | PRN
  Administered 2021-07-27: 300 mL via INTRAVENOUS

## 2021-07-27 MED ORDER — OXYCODONE HCL 5 MG/5ML PO SOLN: 5.0000 mg | Freq: Once | ORAL | Status: DC | PRN

## 2021-07-27 MED ORDER — LIDOCAINE 2% (20 MG/ML) 5 ML SYRINGE
INTRAMUSCULAR | Status: AC
  Filled 2021-07-27: qty 5

## 2021-07-27 MED ORDER — DEXAMETHASONE SODIUM PHOSPHATE 4 MG/ML IJ SOLN
INTRAMUSCULAR | Status: AC
  Filled 2021-07-27: qty 1

## 2021-07-27 MED ORDER — FLEET ENEMA 7-19 GM/118ML RE ENEM: 1.0000 | ENEMA | Freq: Every day | RECTAL | Status: DC | PRN

## 2021-07-27 MED ORDER — ZOLPIDEM TARTRATE 5 MG PO TABS: 5.0000 mg | ORAL_TABLET | Freq: Every evening | ORAL | Status: DC | PRN

## 2021-07-27 MED ORDER — COCONUT OIL OIL: 1.0000 | TOPICAL_OIL | Status: DC | PRN

## 2021-07-27 MED ORDER — FENTANYL CITRATE (PF) 250 MCG/5ML IJ SOLN
INTRAMUSCULAR | Status: AC
  Filled 2021-07-27: qty 5

## 2021-07-27 MED ORDER — SENNOSIDES-DOCUSATE SODIUM 8.6-50 MG PO TABS
2.0000 | ORAL_TABLET | ORAL | Status: DC
  Administered 2021-07-27 – 2021-07-28 (×2): 2 via ORAL
  Filled 2021-07-27 (×2): qty 2

## 2021-07-27 MED ORDER — CEFAZOLIN SODIUM-DEXTROSE 2-4 GM/100ML-% IV SOLN: 2.0000 g | INTRAVENOUS | Status: DC

## 2021-07-27 MED ORDER — POVIDONE-IODINE 10 % EX SWAB: 2.0000 | Freq: Once | CUTANEOUS | Status: DC

## 2021-07-27 MED ORDER — DIBUCAINE (PERIANAL) 1 % EX OINT: 1.0000 | TOPICAL_OINTMENT | CUTANEOUS | Status: DC | PRN

## 2021-07-27 MED ORDER — ROCURONIUM BROMIDE 100 MG/10ML IV SOLN
INTRAVENOUS | Status: DC | PRN
  Administered 2021-07-27: 30 mg via INTRAVENOUS

## 2021-07-27 MED ORDER — TRANEXAMIC ACID-NACL 1000-0.7 MG/100ML-% IV SOLN
INTRAVENOUS | Status: DC | PRN
  Administered 2021-07-27 (×2): 1000 mg via INTRAVENOUS

## 2021-07-27 MED ORDER — WITCH HAZEL-GLYCERIN EX PADS: 1.0000 | MEDICATED_PAD | CUTANEOUS | Status: DC | PRN

## 2021-07-27 SURGICAL SUPPLY — 45 items
APL SKNCLS STERI-STRIP NONHPOA (GAUZE/BANDAGES/DRESSINGS) ×1
BENZOIN TINCTURE PRP APPL 2/3 (GAUZE/BANDAGES/DRESSINGS) ×2 IMPLANT
CHLORAPREP W/TINT 26ML (MISCELLANEOUS) ×4 IMPLANT
CLAMP CORD UMBIL (MISCELLANEOUS) ×2 IMPLANT
CLOTH BEACON ORANGE TIMEOUT ST (SAFETY) ×2 IMPLANT
DRAPE C SECTION CLR SCREEN (DRAPES) IMPLANT
DRESSING PREVENA PLUS CUSTOM (GAUZE/BANDAGES/DRESSINGS) IMPLANT
DRSG OPSITE POSTOP 4X10 (GAUZE/BANDAGES/DRESSINGS) ×2 IMPLANT
DRSG PREVENA PLUS CUSTOM (GAUZE/BANDAGES/DRESSINGS) ×2
ELECT REM PT RETURN 9FT ADLT (ELECTROSURGICAL) ×2
ELECTRODE REM PT RTRN 9FT ADLT (ELECTROSURGICAL) ×1 IMPLANT
EXCISOR BIOPSY CONE FISHER (MISCELLANEOUS) ×1 IMPLANT
EXTRACTOR VACUUM M CUP 4 TUBE (SUCTIONS) IMPLANT
GLOVE BIO SURGEON STRL SZ7.5 (GLOVE) ×2 IMPLANT
GLOVE BIOGEL PI IND STRL 7.0 (GLOVE) ×2 IMPLANT
GLOVE BIOGEL PI INDICATOR 7.0 (GLOVE) ×2
GOWN STRL REUS W/TWL 2XL LVL3 (GOWN DISPOSABLE) ×2 IMPLANT
GOWN STRL REUS W/TWL LRG LVL3 (GOWN DISPOSABLE) ×4 IMPLANT
KIT ABG SYR 3ML LUER SLIP (SYRINGE) IMPLANT
NDL HYPO 25X5/8 SAFETYGLIDE (NEEDLE) IMPLANT
NEEDLE HYPO 22GX1.5 SAFETY (NEEDLE) ×2 IMPLANT
NEEDLE HYPO 25X5/8 SAFETYGLIDE (NEEDLE) IMPLANT
NS IRRIG 1000ML POUR BTL (IV SOLUTION) ×2 IMPLANT
PACK C SECTION WH (CUSTOM PROCEDURE TRAY) ×2 IMPLANT
PAD ABD DERMACEA PRESS 5X9 (GAUZE/BANDAGES/DRESSINGS) ×1 IMPLANT
PAD OB MATERNITY 4.3X12.25 (PERSONAL CARE ITEMS) ×2 IMPLANT
RTRCTR C-SECT PINK 25CM LRG (MISCELLANEOUS) ×2 IMPLANT
STRIP CLOSURE SKIN 1/2X4 (GAUZE/BANDAGES/DRESSINGS) ×2 IMPLANT
SUT CHROMIC 1 CTX 36 (SUTURE) ×4 IMPLANT
SUT PLAIN 0 NONE (SUTURE) IMPLANT
SUT VIC AB 0 CT1 27 (SUTURE) ×2
SUT VIC AB 0 CT1 27XBRD ANBCTR (SUTURE) IMPLANT
SUT VIC AB 1 CT1 36 (SUTURE) ×2 IMPLANT
SUT VIC AB 2-0 CT1 (SUTURE) ×2 IMPLANT
SUT VIC AB 2-0 CT1 27 (SUTURE) ×2
SUT VIC AB 2-0 CT1 TAPERPNT 27 (SUTURE) ×1 IMPLANT
SUT VIC AB 3-0 CT1 27 (SUTURE) ×2
SUT VIC AB 3-0 CT1 TAPERPNT 27 (SUTURE) ×1 IMPLANT
SUT VIC AB 3-0 SH 27 (SUTURE)
SUT VIC AB 3-0 SH 27X BRD (SUTURE) IMPLANT
SUT VIC AB 4-0 KS 27 (SUTURE) ×2 IMPLANT
SYR BULB IRRIGATION 50ML (SYRINGE) IMPLANT
TOWEL OR 17X24 6PK STRL BLUE (TOWEL DISPOSABLE) ×2 IMPLANT
TRAY FOLEY W/BAG SLVR 14FR LF (SET/KITS/TRAYS/PACK) ×2 IMPLANT
WATER STERILE IRR 1000ML POUR (IV SOLUTION) ×2 IMPLANT

## 2021-07-27 NOTE — Progress Notes (Signed)
Patient ID: [name], female   DOB: Mar 09, 1995, 26 y.o.   MRN: [id number] [facility] PRACTICE ANTEPARTUM COMPREHENSIVE PROGRESS NOTE  Lisa [name] is a 26 y.o. G1P0000 at [redacted]w[redacted]d  who is admitted for IUP 25 2/7 weeks with SIPEC and HELLP .   Fetal presentation is unsure. Length of Stay:  5  Days  Subjective: Pt without complaints this morning. Denies HA, blurry vision or RUQ pain.  Patient reports good fetal movement.  She reports no uterine contractions, no bleeding and no loss of fluid per vagina.  Vitals:  Blood pressure (!) 144/96, pulse 85, temperature 98.2 F (36.8 C), temperature source Oral, resp. rate 18, height 5\' 2"  (1.575 m), weight 117.7 kg, last menstrual period 01/24/2021, SpO2 100 %.  Physical Examination: Lungs clear Heart RRR Abd soft + BS gravid Ext non tender  Fetal Monitoring:  Baseline: 130 bpm, Variability: Fair (1-6 bpm), Accelerations: Non-reactive but appropriate for gestational age, and Decelerations: Absent  Labs:  Results for orders placed or performed during the hospital encounter of 07/21/21 (from the past 24 hour(s))  Comprehensive metabolic panel   Collection Time: 07/26/21  3:17 PM  Result Value Ref Range   Sodium 136 135 - 145 mmol/L   Potassium 3.7 3.5 - 5.1 mmol/L   Chloride 109 98 - 111 mmol/L   CO2 20 (L) 22 - 32 mmol/L   Glucose, Bld 87 70 - 99 mg/dL   BUN 8 6 - 20 mg/dL   Creatinine, Ser 07/28/21 0.44 - 1.00 mg/dL   Calcium 8.3 (L) 8.9 - 10.3 mg/dL   Total Protein 5.4 (L) 6.5 - 8.1 g/dL   Albumin 2.5 (L) 3.5 - 5.0 g/dL   AST 53 (H) 15 - 41 U/L   ALT 77 (H) 0 - 44 U/L   Alkaline Phosphatase 61 38 - 126 U/L   Total Bilirubin 0.5 0.3 - 1.2 mg/dL   GFR, Estimated 1.28 >78 mL/min   Anion gap 7 5 - 15  CBC   Collection Time: 07/26/21  3:17 PM  Result Value Ref Range   WBC 11.9 (H) 4.0 - 10.5 K/uL   RBC 3.93 3.87 - 5.11 MIL/uL   Hemoglobin 11.1 (L) 12.0 - 15.0 g/dL   HCT 07/28/21 (L) 67.2 - 09.4 %   MCV 81.9 80.0 - 100.0 fL   MCH 28.2 26.0 -  34.0 pg   MCHC 34.5 30.0 - 36.0 g/dL   RDW 70.9 62.8 - 36.6 %   Platelets 87 (L) 150 - 400 K/uL   nRBC 0.3 (H) 0.0 - 0.2 %  Comprehensive metabolic panel   Collection Time: 07/26/21  9:11 PM  Result Value Ref Range   Sodium 135 135 - 145 mmol/L   Potassium 3.4 (L) 3.5 - 5.1 mmol/L   Chloride 107 98 - 111 mmol/L   CO2 21 (L) 22 - 32 mmol/L   Glucose, Bld 82 70 - 99 mg/dL   BUN 6 6 - 20 mg/dL   Creatinine, Ser 07/28/21 0.44 - 1.00 mg/dL   Calcium 8.0 (L) 8.9 - 10.3 mg/dL   Total Protein 4.9 (L) 6.5 - 8.1 g/dL   Albumin 2.3 (L) 3.5 - 5.0 g/dL   AST 48 (H) 15 - 41 U/L   ALT 70 (H) 0 - 44 U/L   Alkaline Phosphatase 62 38 - 126 U/L   Total Bilirubin 0.8 0.3 - 1.2 mg/dL   GFR, Estimated 7.65 >46 mL/min   Anion gap 7 5 - 15  CBC  Collection Time: 07/26/21  9:11 PM  Result Value Ref Range   WBC 12.4 (H) 4.0 - 10.5 K/uL   RBC 3.80 (L) 3.87 - 5.11 MIL/uL   Hemoglobin 10.8 (L) 12.0 - 15.0 g/dL   HCT 75.6 (L) 43.3 - 29.5 %   MCV 80.3 80.0 - 100.0 fL   MCH 28.4 26.0 - 34.0 pg   MCHC 35.4 30.0 - 36.0 g/dL   RDW 18.8 41.6 - 60.6 %   Platelets 79 (L) 150 - 400 K/uL   nRBC 0.2 0.0 - 0.2 %  Comprehensive metabolic panel   Collection Time: 07/27/21  3:29 AM  Result Value Ref Range   Sodium 134 (L) 135 - 145 mmol/L   Potassium 3.6 3.5 - 5.1 mmol/L   Chloride 107 98 - 111 mmol/L   CO2 20 (L) 22 - 32 mmol/L   Glucose, Bld 86 70 - 99 mg/dL   BUN 7 6 - 20 mg/dL   Creatinine, Ser 3.01 0.44 - 1.00 mg/dL   Calcium 7.6 (L) 8.9 - 10.3 mg/dL   Total Protein 5.0 (L) 6.5 - 8.1 g/dL   Albumin 2.3 (L) 3.5 - 5.0 g/dL   AST 45 (H) 15 - 41 U/L   ALT 65 (H) 0 - 44 U/L   Alkaline Phosphatase 61 38 - 126 U/L   Total Bilirubin 0.7 0.3 - 1.2 mg/dL   GFR, Estimated >60 >10 mL/min   Anion gap 7 5 - 15  CBC   Collection Time: 07/27/21  3:29 AM  Result Value Ref Range   WBC 11.3 (H) 4.0 - 10.5 K/uL   RBC 3.94 3.87 - 5.11 MIL/uL   Hemoglobin 11.4 (L) 12.0 - 15.0 g/dL   HCT 93.2 (L) 35.5 - 73.2 %   MCV  79.7 (L) 80.0 - 100.0 fL   MCH 28.9 26.0 - 34.0 pg   MCHC 36.3 (H) 30.0 - 36.0 g/dL   RDW 20.2 54.2 - 70.6 %   Platelets 76 (L) 150 - 400 K/uL   nRBC 0.2 0.0 - 0.2 %  Type and screen [facility]   Collection Time: 07/27/21  3:29 AM  Result Value Ref Range   ABO/RH(D) B POS    Antibody Screen NEG    Sample Expiration      07/30/2021,2359 Performed at [facility], [address] [place], [place] [address]     Imaging Studies:    Reverse EDF on doppler yesterday   Medications:  Scheduled  aspirin EC  81 mg Oral Daily   budesonide  0.5 mg Nebulization BID   docusate sodium  100 mg Oral Daily   fluticasone  1 spray Each Nare Daily   lamoTRIgine  25 mg Oral Daily   NIFEdipine  60 mg Oral Daily   povidone-iodine  2 Application Topical Once   prenatal multivitamin  1 tablet Oral Q1200   I have reviewed the patient's current medications.  ASSESSMENT: IUP 25 2/7 weeks SPEC with HELLP Non epileptic SZ Abnormal doppler studies Desire for sterilization  PLAN: Stable. Proceeding toward delivery via c section due to abnormal doppler studies in setting of SPEC and HELLP. Labs stable.   C section and tubal reviewed with pt.  The risks of cesarean section discussed with the patient included but were not limited to: bleeding which may require transfusion or reoperation; infection which may require antibiotics; injury to bowel, bladder, ureters or other surrounding organs; injury to the fetus; need for additional procedures including  hysterectomy in the event of a life-threatening hemorrhage; placental abnormalities wth subsequent pregnancies, incisional problems, thromboembolic phenomenon and other postoperative/anesthesia complications. The patient concurred with the proposed plan, giving informed written consent for the procedure.   Anesthesia and OR aware. Preoperative prophylactic antibiotics and SCDs ordered on call to the OR.  To OR when ready.  Patient  desires bilateral tubal sterilization.  Other reversible forms of contraception were discussed with patient; she declines all other modalities. Discussed bilateral tubal sterilization in detail; discussed options of laparoscopic bilateral tubal sterilization using Filshie clips vs laparoscopic bilateral salpingectomy. Risks and benefits discussed in detail including but not limited to: risk of regret, permanence of method, bleeding, infection, injury to surrounding organs and need for additional procedures.  Failure risk of 1-2 % for Filshie clips and <1% for bilateral salpingectomy with increased risk of ectopic gestation if pregnancy occurs was also discussed with patient.  Also discussed possible reduction of risk of ovarian cancer via bilateral salpingectomy given that a growing body of knowledge reveals that the majority of cases of high grade serous "ovarian" cancer actually are actually  cancers arising from the fimbriated end of the fallopian tubes. Emphasized that removal of fallopian tubes do not result in any known hormonal imbalance.  Patient verbalized understanding of these risks and benefits and wants to proceed with sterilization via salpingectomy.     For c section with tubal this morning   Chancy Milroy 07/27/2021,8:44 AM

## 2021-07-27 NOTE — Discharge Summary (Signed)
Postpartum Discharge Summary  Date of Service updated 07/29/21    Patient Name: Lisa [name] DOB: 1995/11/13 MRN: [id number]  Date of admission: 07/21/2021 Delivery date:07/27/2021  Delivering provider: Caren Macadam  Date of discharge: 07/29/2021  Admitting diagnosis: Seizures ([place]) [R56.9] Seizure-like activity ([place]) [R56.9] [redacted] weeks gestation of pregnancy [Z3A.24] Seizure ([place]) [R56.9] Intrauterine pregnancy: [redacted]w[redacted]d    Secondary diagnosis:  Principal Problem:   S/P cesarean section Active Problems:   Seizures ([place]   Preeclampsia   HELLP syndrome   Status post bilateral salpingectomy  Additional problems: NA    Discharge diagnosis: Preterm Pregnancy Delivered                                              Post partum procedures: Bilateral salpingectomy  Augmentation: N/A Complications: None  Hospital course: Sceduled C/S - 26y.o. yo G1P0101 at 219w2das admitted to the hospital 07/21/2021 for scheduled cesarean section with the following indication:  Severe pre-E with HELLP, FGR with REDF.  Delivery details are as follows:  Membrane Rupture Time/Date: 11:07 AM ,07/27/2021   Delivery Method:C-Section, Low Transverse  Details of operation can be found in separate operative note.  Patient was continued on magnesium for 24 hours postpartum.  Her BP was controlled with Procardia 30 mg XL.  She was started on Lasix, which she will continue upon discharge to complete a 5 day course.  She is ambulating, tolerating a regular diet, passing flatus, and urinating well. Patient is discharged home in stable condition on  07/29/21        Unfortunately infant girl  (LJacqulyn Linerpassed away the evening of 07/15/17/23Newborn Data: Birth date:07/27/2021  Birth time:11:07 AM  Gender:Female  Living status: Passed 07/15/17/23pgars:1 ,2  Weight:450 g     Magnesium Sulfate received: Yes: Seizure prophylaxis BMZ received: Yes Rhophylac: N/A MMR: N/A - Immune  T-DaP: Offered postpartum   Flu: No Transfusion: No  Physical exam  Vitals:   [id number] 07/29/21 0021 07/29/21 0346  BP: (!) 138/91 (!) 144/96 133/77 (!) 138/99  Pulse: 78 81 72 88  Resp:  20 17 15   Temp:  97.6 F (36.4 C) 98.6 F (37 C) 98.6 F (37 C)  TempSrc:  Oral Oral Oral  SpO2:  100% 98% 100%  Weight:      Height:       General: alert Lochia: appropriate Uterine Fundus: firm Incision: Healing well with no significant drainage DVT Evaluation: No evidence of DVT seen on physical exam.  Labs: Lab Results  Component Value Date   WBC 12.8 (H) 07/29/2021   HGB 9.6 (L) 07/29/2021   HCT 27.7 (L) 07/29/2021   MCV 83.9 07/29/2021   PLT 105 (L) 07/29/2021      Latest Ref Rng & Units 07/29/2021    6:24 AM  CMP  Glucose 70 - 99 mg/dL 86   BUN 6 - 20 mg/dL 5   Creatinine 0.44 - 1.00 mg/dL 0.94   Sodium 135 - 145 mmol/L 138   Potassium 3.5 - 5.1 mmol/L 3.5   Chloride 98 - 111 mmol/L 107   CO2 22 - 32 mmol/L 25   Calcium 8.9 - 10.3 mg/dL 7.2   Total Protein 6.5 - 8.1 g/dL 5.0   Total Bilirubin 0.3 - 1.2 mg/dL 0.5   Alkaline Phos 38 -  126 U/L 60   AST 15 - 41 U/L 22   ALT 0 - 44 U/L 33    Edinburgh Score:    07/28/2021    2:13 PM  Edinburgh Postnatal Depression Scale Screening Tool  I have been able to laugh and see the funny side of things. 1  I have looked forward with enjoyment to things. 0  I have blamed myself unnecessarily when things went wrong. 2  I have been anxious or worried for no good reason. 3  I have felt scared or panicky for no good reason. 2  Things have been getting on top of me. 2  I have been so unhappy that I have had difficulty sleeping. 1  I have felt sad or miserable. 2  I have been so unhappy that I have been crying. 1  The thought of harming myself has occurred to me. 0  Edinburgh Postnatal Depression Scale Total 14    After visit meds:  Allergies as of 07/29/2021       Reactions   Amoxicillin Hives, Shortness Of Breath, Rash   Contrast  Media [iodinated Contrast Media] Hives, Shortness Of Breath   Fish Allergy Anaphylaxis   Percocet [oxycodone-acetaminophen] Anaphylaxis   Pt states she can take plain acetaminophen   Atarax [hydroxyzine] Other (See Comments)   Visual hallucinations        Medication List     STOP taking these medications    aspirin EC 81 MG tablet   fluticasone 50 MCG/ACT nasal spray Commonly known as: FLONASE   multivitamin-prenatal 27-0.8 MG Tabs tablet   predniSONE 10 MG tablet Commonly known as: DELTASONE       TAKE these medications    albuterol (2.5 MG/3ML) 0.083% nebulizer solution Commonly known as: PROVENTIL Take 2.5 mg by nebulization every 4 (four) hours as needed for wheezing or shortness of breath.   albuterol 108 (90 Base) MCG/ACT inhaler Commonly known as: VENTOLIN HFA Inhale 2 puffs into the lungs every 4 (four) hours as needed for wheezing or shortness of breath.   EPINEPHrine 0.3 mg/0.3 mL Soaj injection Commonly known as: EPI-PEN Inject 0.3 mg into the muscle once as needed for up to 1 dose. What changed: reasons to take this   fluticasone 44 MCG/ACT inhaler Commonly known as: Flovent HFA Inhale 2 puffs into the lungs in the morning and at bedtime.   furosemide 20 MG tablet Commonly known as: LASIX Take 1 tablet (20 mg total) by mouth daily.   HYDROmorphone 2 MG tablet Commonly known as: DILAUDID Take 0.5 tablets (1 mg total) by mouth every 6 (six) hours as needed for moderate pain or severe pain.   ibuprofen 600 MG tablet Commonly known as: ADVIL Take 1 tablet (600 mg total) by mouth every 6 (six) hours.   NIFEdipine 30 MG 24 hr tablet Commonly known as: PROCARDIA-XL/NIFEDICAL-XL Take 1 tablet (30 mg total) by mouth daily. Can increase to twice a day as needed for symptomatic contractions What changed: additional instructions   potassium chloride 20 MEQ packet Commonly known as: KLOR-CON Take 20 mEq by mouth daily.               Discharge  Care Instructions  (From admission, onward)           Start     Ordered   07/29/21 0000  Discharge wound care:       Comments: Continue with Pro[place]a until appt 08/03/21 nurse appt.   07/29/21 1033  Discharge home in stable condition Infant Disposition: [place] Discharge instruction: per After Visit Summary and Postpartum booklet. Activity: Advance as tolerated. Pelvic rest for 6 weeks.  Diet: routine diet Future Appointments: Future Appointments  Date Time Provider [place]  08/03/2021  3:00 PM [facility] NURSE [facility]  08/07/2021  2:15 PM [facility] NURSE [facility]  08/07/2021  2:30 PM [facility] US3 [facility]  08/25/2021  2:00 PM Tobb, Kardie, DO [facility] None  08/31/2021 10:55 AM Donnamae Jude, MD [facility]   Follow up Visit:  [place] [facility] Follow up.   Specialty: Obstetrics and Gynecology Why: Nurse visit for BP and incision check on 08/03/21 Contact information: [place] [id number] [phone]               Message sent to [facility] by Dr. Gwenlyn Perking on 07/27/21.   Please schedule this patient for a In person postpartum visit in 4 weeks with the following provider: MD. Additional Postpartum F/U: Incision check 1 week and BP check 1 week  High risk pregnancy complicated by: Pre-E and HELLP syndrome, FGR with REDF Delivery mode:  C-Section, Low Transverse  Anticipated Birth Control:  BTL done [facility]  07/29/2021 Chancy Milroy, MD

## 2021-07-27 NOTE — Anesthesia Preprocedure Evaluation (Addendum)
Anesthesia Evaluation  Patient identified by MRN, date of birth, ID band Patient awake    Reviewed: Allergy & Precautions, NPO status , Patient's Chart, lab work & pertinent test results  Airway Mallampati: III  TM Distance: >3 FB Neck ROM: Full    Dental no notable dental hx. (+) Teeth Intact, Dental Advisory Given   Pulmonary asthma ,    Pulmonary exam normal breath sounds clear to auscultation       Cardiovascular hypertension, Normal cardiovascular exam Rhythm:Regular Rate:Normal     Neuro/Psych  Headaches, Seizures -, Poorly Controlled,  PSYCHIATRIC DISORDERS Anxiety    GI/Hepatic Neg liver ROS, GERD  ,  Endo/Other    Renal/GU Lab Results      Component                Value               Date                      CREATININE               0.78                07/27/2021                BUN                      7                   07/27/2021                NA                       134 (L)             07/27/2021                K                        3.6                 07/27/2021                CL                       107                 07/27/2021                CO2                      20 (L)              07/27/2021                Musculoskeletal   Abdominal (+) + obese (BMI 47.5),   Peds  Hematology Lab Results      Component                Value               Date                      WBC                      11.3 (H)  07/27/2021                HGB                      11.4 (L)            07/27/2021                HCT                      31.4 (L)            07/27/2021                MCV                      79.7 (L)            07/27/2021                PLT                      76 (L)              07/27/2021              Anesthesia Other Findings ALL: Amoxicillin, percocet-" Hives only"   Reproductive/Obstetrics (+) Pregnancy                          Anesthesia  Physical Anesthesia Plan  ASA: 3  Anesthesia Plan: General   Post-op Pain Management: Minimal or no pain anticipated and Dilaudid IV   Induction:   PONV Risk Score and Plan: Treatment may vary due to age or medical condition, Ondansetron and Dexamethasone  Airway Management Planned: Oral ETT  Additional Equipment: None  Intra-op Plan:   Post-operative Plan: Extubation in OR  Informed Consent: I have reviewed the patients History and Physical, chart, labs and discussed the procedure including the risks, benefits and alternatives for the proposed anesthesia with the patient or authorized representative who has indicated his/her understanding and acceptance.     Dental advisory given  Plan Discussed with: CRNA and Anesthesiologist  Anesthesia Plan Comments: (25.2 wk Primagravida w hx of ? Eclamptic Sz for  Primary C/S)      Anesthesia Quick Evaluation

## 2021-07-27 NOTE — Op Note (Addendum)
Lisa [name]  PROCEDURE DATE: 07/27/2021  PREOPERATIVE DIAGNOSES: Intrauterine pregnancy at [redacted]w[redacted]d weeks gestation; pre-eclampsia with severe features, HELLP syndrome, FGR with reverse end diastolic flow on Doppler studies, unwanted fertility   POSTOPERATIVE DIAGNOSES: The same  PROCEDURE: Primary Low Transverse Cesarean Section and Bilateral Salpingectomy  SURGEON:  Dr. Lyndel Safe  ASSISTANT SURGEON:  Dr. Nettie Elm OB FELLOW: Dr. Mathis Fare and Dr. Salvadore Dom  Drs Mathis Fare and Autry-Lott are both [facility] Fellows and were there in a learning capacity.   Dr. Alysia Penna was present to provide support from an experienced assistant surgeon in the case that a classical incision needed to be made or other intraoperative complications.  An experienced surgeon was required given the standard of surgical care given the complexity of the case.  This assistant was needed for exposure, dissection, suctioning, retraction, instrument exchange, assisting with delivery with administration of fundal pressure, and for overall help during the procedure.  ANESTHESIOLOGY TEAM: Anesthesiologist: Trevor Iha, MD CRNA: Elbert Ewings, CRNA  INDICATIONS: Lisa [name] is a 26 y.o. G1P0101 at [redacted]w[redacted]d here for cesarean section secondary to the indications listed under preoperative diagnoses; please see preoperative note for further details.  The risks of cesarean section were discussed with the patient including but were not limited to: bleeding which may require transfusion or reoperation; infection which may require antibiotics; injury to bowel, bladder, ureters or other surrounding organs; injury to the fetus; need for additional procedures including hysterectomy in the event of a life-threatening hemorrhage; placental abnormalities wth subsequent pregnancies, incisional problems, thromboembolic phenomenon and other postoperative/anesthesia complications.   The patient concurred with the proposed plan, giving  informed written consent for the procedure.    FINDINGS:  Viable female infant in cephalic presentation.  Apgars pending.  Clear amniotic fluid.  Intact placenta, three vessel cord.  Abnormal placental bed noted on the posterior decidual layer of the uterus (s/p curettage). Normal fallopian tubes and ovaries bilaterally.    CORD GAS (arterial pH): 7.19  ANESTHESIA: General INTRAVENOUS FLUIDS: ~900 ml   ESTIMATED BLOOD LOSS: 290 ml URINE OUTPUT:  400 ml SPECIMENS: Placenta sent to pathology COMPLICATIONS: None immediate  PROCEDURE IN DETAIL:   The patient preoperatively received intravenous antibiotics and had sequential compression devices applied to her lower extremities. She was then taken to the operating room. She was placed in a dorsal supine position with a leftward tilt, and prepped and draped in a sterile manner.  A foley catheter was placed into her bladder and attached to constant gravity.  A time out was performed. General anesthesia was administered.    A Pfannenstiel skin incision was then made with a scalpel and carried through to the underlying layer of fascia. The fascia was incised in the midline, and this incision was extended bilaterally bluntly. The rectus muscles were separated in the midline and the peritoneum was entered bluntly. The Alexis self-retaining retractor was introduced into the abdominal cavity.    Attention was turned to the lower uterine segment where a low to mid transverse hysterotomy was made with a scalpel and extended bilaterally bluntly. The infant was successfully delivered, the cord was clamped and cut immediately, and the infant was handed over to the awaiting neonatology team. Uterine massage was then administered, and the placenta delivered intact with a three-vessel cord. The uterus was then cleared of clots and debris. Posterior decidual layer at site of placental attachment was abnormal appearing with irregular surface after removal of placenta.  Curettage performed with Banjo curette.  The hysterotomy was closed with 0 Vicryl in a running locked fashion.  Attention was then turned to the left fallopian tube. Kelly forceps were placed on the mesosalpinx underneath most of the tube.  This pedicle was double suture ligated with free ties, and the tube including the fimbriated end was excised.  The right fallopian tube was then identified, doubly ligated, and was excised in a similar fashion allowing for bilateral tubal sterilization via bilateral salpingectomy.  Good hemostasis was noted overall.  Attention then returned to closed hysterotomy. Figure-of-eight 0 Vicryl serosal stitches were placed to help with hemostasis. The pelvis was cleared of all clot and debris. Hemostasis was confirmed on all surfaces.  The retractor was removed.    The peritoneum and rectus muscles were re-approximated with 2-0 Vicryl. The fascia was then closed using 0 Vicryl in a running fashion.  The subcutaneous layer was irrigated, re-approximated with a 2-0 plain gut running stitch, and the skin was closed with a 4-0 Vicryl subcuticular stitch. The patient tolerated the procedure well. Sponge, instrument and needle counts were correct x 3.  She was taken to the recovery room in stable condition.   Evalina Field, MD  OB Fellow  Faculty Practice   Attestation of Attending Supervision of OB Fellow: Evaluation and management procedures were performed by the Family Medicine OB Fellow under my supervision.  I have reviewed the Fellow's note and chart. I was gloved and gowned for the entirety of the procedure and involved during the case. I have made any necessary editorial changes.   Bilateral salpingectomy was performed.   Federico Flake, MD, MPH, ABFM Attending Physician Center for [facility]

## 2021-07-27 NOTE — Transfer of Care (Signed)
Immediate Anesthesia Transfer of Care Note  Patient: Lisa [name]  Procedure(s) Performed: CESAREAN SECTION with bilateral tubal ligation  Patient Location: PACU  Anesthesia Type:General  Level of Consciousness: awake, alert  and oriented  Airway & Oxygen Therapy: Patient Spontanous Breathing and Patient connected to nasal cannula oxygen  Post-op Assessment: Report given to RN and Post -op Vital signs reviewed and stable  Post vital signs: Reviewed and stable  Last Vitals:  Vitals Value Taken Time  BP 106/90 07/27/21 1230  Temp    Pulse 84 07/27/21 1233  Resp 17 07/27/21 1233  SpO2 100 % 07/27/21 1233  Vitals shown include unvalidated device data.  Last Pain:  Vitals:   07/27/21 0800  TempSrc:   PainSc: 0-No pain      Patients Stated Pain Goal: 4 (07/26/21 0815)  Complications: No notable events documented.

## 2021-07-27 NOTE — Progress Notes (Signed)
PCP Social Note  Stopped by patient's room this afternoon to offer her support after her c/s earlier today. When I entered, there was a female visitor in her room with whom patient was arguing. The two of them were cussing back and forth at one another, and patient asking me to have him leave her room.  I stepped out to contact RN/unit director and while I was out of room the female visitor left. Patient reports this visitor thinks he is the father of her child, but patient denies he is the father. He apparently came wanting to see the baby and is upset that his name is not being given to the baby. Security came into the room at this point but visitor had already left.  Patient reports she does not know his full name or birthday, says his first name is "Shaquille".   Patient very upset, saying she does not want to see him again in the hospital. She called her best friend on the phone and was engaged in conversation, so I offered supportive words and stepped out of the room.   Spoke with patient's mom, who is aware, as is patient's Charity fundraiser.  Latrelle Dodrill, MD

## 2021-07-27 NOTE — Lactation Note (Signed)
This note was copied from a baby's chart. Lactation Consultation Note  Patient Name: Lisa [name] HYIFO'Y Date: 07/27/2021   Age:25 hours  Spoke to [facility] care RN Candise Bowens and she reported that Lisa [name] would like to be followed up tomorrow. She's still undecided regarding feedings. Feeding choice on admission is documented as formula but GOB voiced MOB will be breastfeeding after NICU admission. LC to F/U with mom tomorrow for initial assessment.    Verlin Duke S Eain Mullendore 07/27/2021, 5:53 PM

## 2021-07-27 NOTE — Anesthesia Procedure Notes (Signed)
Procedure Name: Intubation Date/Time: 07/27/2021 11:04 AM  Performed by: Elenore Paddy, CRNAPre-anesthesia Checklist: Patient identified, Patient being monitored, Timeout performed, Emergency Drugs available and Suction available Patient Re-evaluated:Patient Re-evaluated prior to induction Oxygen Delivery Method: Circle System Utilized Preoxygenation: Pre-oxygenation with 100% oxygen Induction Type: IV induction, Rapid sequence and Cricoid Pressure applied Laryngoscope Size: Mac, 3 and Glidescope Grade View: Grade II Tube type: Oral Tube size: 7.0 mm Number of attempts: 1 Airway Equipment and Method: stylet Placement Confirmation: ETT inserted through vocal cords under direct vision, positive ETCO2 and breath sounds checked- equal and bilateral Secured at: 21 cm Tube secured with: Tape Dental Injury: Teeth and Oropharynx as per pre-operative assessment

## 2021-07-27 NOTE — Anesthesia Postprocedure Evaluation (Signed)
Anesthesia Post Note  Patient: Lisa [name]  Procedure(s) Performed: CESAREAN SECTION with bilateral tubal ligation     Patient location during evaluation: PACU Anesthesia Type: General Level of consciousness: awake and alert Pain management: pain level controlled Vital Signs Assessment: post-procedure vital signs reviewed and stable Respiratory status: spontaneous breathing, nonlabored ventilation, respiratory function stable and patient connected to nasal cannula oxygen Cardiovascular status: blood pressure returned to baseline and stable Postop Assessment: no apparent nausea or vomiting Anesthetic complications: no   No notable events documented.  Last Vitals:  Vitals:   07/27/21 1355 07/27/21 1356  BP:  120/90  Pulse:  70  Resp:  18  Temp:  (!) 36.3 C  SpO2: 96% 100%    Last Pain:  Vitals:   07/27/21 1356  TempSrc: Axillary  PainSc:    Pain Goal: Patients Stated Pain Goal: 3 (07/27/21 1230)                 Trevor Iha

## 2021-07-28 LAB — COMPREHENSIVE METABOLIC PANEL
ALT: 42 U/L (ref 0–44)
AST: 26 U/L (ref 15–41)
Albumin: 2.2 g/dL — ABNORMAL LOW (ref 3.5–5.0)
Alkaline Phosphatase: 67 U/L (ref 38–126)
Anion gap: 7 (ref 5–15)
BUN: 6 mg/dL (ref 6–20)
CO2: 22 mmol/L (ref 22–32)
Calcium: 6.8 mg/dL — ABNORMAL LOW (ref 8.9–10.3)
Chloride: 106 mmol/L (ref 98–111)
Creatinine, Ser: 0.91 mg/dL (ref 0.44–1.00)
GFR, Estimated: >60 mL/min (ref >60)
Glucose, Bld: 107 mg/dL — ABNORMAL HIGH (ref 70–99)
Potassium: 3.5 mmol/L (ref 3.5–5.1)
Sodium: 135 mmol/L (ref 135–145)
Total Bilirubin: 0.6 mg/dL (ref 0.3–1.2)
Total Protein: 5.1 g/dL — ABNORMAL LOW (ref 6.5–8.1)

## 2021-07-28 LAB — CBC
HCT: 29.4 % — ABNORMAL LOW (ref 36.0–46.0)
Hemoglobin: 10.5 g/dL — ABNORMAL LOW (ref 12.0–15.0)
MCH: 29.2 pg (ref 26.0–34.0)
MCHC: 35.7 g/dL (ref 30.0–36.0)
MCV: 81.7 fL (ref 80.0–100.0)
Platelets: 80 10*3/uL — ABNORMAL LOW (ref 150–400)
RBC: 3.6 MIL/uL — ABNORMAL LOW (ref 3.87–5.11)
RDW: 14.4 % (ref 11.5–15.5)
WBC: 15.7 10*3/uL — ABNORMAL HIGH (ref 4.0–10.5)
nRBC: 0.1 % (ref 0.0–0.2)

## 2021-07-28 MED ORDER — POTASSIUM CHLORIDE 20 MEQ PO PACK
20.0000 meq | PACK | Freq: Every day | ORAL | Status: DC
  Administered 2021-07-28 – 2021-07-29 (×2): 20 meq via ORAL
  Filled 2021-07-28 (×3): qty 1

## 2021-07-28 MED ORDER — LABETALOL HCL 5 MG/ML IV SOLN
INTRAVENOUS | Status: AC
  Filled 2021-07-28: qty 4

## 2021-07-28 MED ORDER — LABETALOL HCL 5 MG/ML IV SOLN
20.0000 mg | INTRAVENOUS | Status: DC | PRN
  Administered 2021-07-28: 20 mg via INTRAVENOUS

## 2021-07-28 MED ORDER — HYDRALAZINE HCL 20 MG/ML IJ SOLN: 10.0000 mg | INTRAMUSCULAR | Status: DC | PRN

## 2021-07-28 MED ORDER — FUROSEMIDE 20 MG PO TABS
20.0000 mg | ORAL_TABLET | Freq: Every day | ORAL | Status: DC
  Administered 2021-07-28 – 2021-07-29 (×2): 20 mg via ORAL
  Filled 2021-07-28 (×2): qty 1

## 2021-07-28 MED ORDER — NIFEDIPINE ER OSMOTIC RELEASE 30 MG PO TB24
30.0000 mg | ORAL_TABLET | Freq: Every day | ORAL | Status: DC
  Administered 2021-07-28 – 2021-07-29 (×2): 30 mg via ORAL
  Filled 2021-07-28 (×2): qty 1

## 2021-07-28 MED ORDER — LABETALOL HCL 5 MG/ML IV SOLN: 40.0000 mg | INTRAVENOUS | Status: DC | PRN

## 2021-07-28 MED ORDER — MIDAZOLAM HCL 2 MG/2ML IJ SOLN
INTRAMUSCULAR | Status: DC | PRN
  Administered 2021-07-27: 2 mg via INTRAVENOUS

## 2021-07-28 MED ORDER — LABETALOL HCL 5 MG/ML IV SOLN: 80.0000 mg | INTRAVENOUS | Status: DC | PRN

## 2021-07-28 NOTE — Progress Notes (Signed)
Subjective: Postpartum Day 1: Cesarean Delivery Patient reports feeling well. She reports pain is well controlled with medication. She ambulated to chair. She is tolerating a regular diet. She is looking forward to visiting her newborn later this morning    Objective: Vital signs in last 24 hours: Temp:  [97.4 F (36.3 C)-98.2 F (36.8 C)] 97.8 F (36.6 C) (07/21 0242) Pulse Rate:  [68-88] 79 (07/21 0242) Resp:  [0-21] 18 (07/21 0535) BP: (106-160)/(78-114) 135/83 (07/21 0242) SpO2:  [96 %-100 %] 100 % (07/21 0242)  Physical Exam:  General: alert, cooperative, and no distress Lochia: appropriate Uterine Fundus: firm Incision: wound vac in place and intact DVT Evaluation: No evidence of DVT seen on physical exam.  Recent Labs    07/27/21 0819 07/28/21 0614  HGB 13.0 10.5*  HCT 36.1 29.4*    Assessment/Plan: Status post Cesarean section. Doing well postoperatively.  Discontinue magnesium sulfate around 11 am this morning BP currently well controlled Encourage ambulation today s/p magnesium Continue current care  Catalina Antigua, MD 07/28/2021, 7:29 AM

## 2021-07-28 NOTE — Progress Notes (Signed)
Situation: Initial visit for pt Baby Girl Kathryne Hitch) and her mother, pt Tyrica Afzal. Chaplain responding to page for NICU code blue.  Background: Facts: Upon arrival, chaplain found Ms. Dalayla in the waiting room awaiting news about her daughter, Kathryne Hitch. Ms. Moet shared that Kathryne Hitch had been born "3 months early" and that "her heart stopped beating". She also shared "and now my tubes are tied so I can't have anymore babies... She's my first child, and I can't have anymore." During the visit, the doctor told Ms. Zeda that [place] had died. Family shared that Ms. Treazure needed to stay in the hospital "until your vitals are looking good." This chaplain also learned from medical staff that Ms. May has some developmental challenges, so her parents manage her care. Family: Family present included Ms. Russell's stepfather, Ethelene Browns. He shared that Ms. Imajean's mother had been en route to [place] but was now on the way to the hospital. During the visit, many family members were calling Ms. Mirabelle on her phone.  Feelings: Ms. Aamani was tearful throughout the visit and upon hearing about her daughter's death, she repeatedly told staff and family, "I need to get out of here. I can't stay in this hospital where my daughter died. I need to leave." Ms. Trine mother was also audibly tearful over the phone as they spoke. Mr. Ethelene Browns was somber and seemed to be a calming presence to Ms. Jeslyn, expressing, "I know you want to leave, but we just need to get through one more night to make sure your vitals are looking good." Faith: This chaplain noticed that as Ms. Fatma was on her phone, she would go back and forth from calling/answering calls from family members to listening to what sounded like [place] music. This chaplain then offered to pray to which Ms. Margreta Journey, said, "Please."  Actions & Assessments: Chaplain offered prayer for God's presence, to meet the family in the midst of grief  and pain, and that we might entrust [place] into [place] care. Chaplain also offered compassionate presence during the family meeting with the medical team.  Recommendations: For Chaplains: Consider engaging with Ms. Ilena in ways that help her to remember and commemorate her daughter, Kathryne Hitch. Ms. Corretta seems to find prayer and spiritual music meaningful at this time. For Staff: Continue to remind Ms. Gaia of her own needs, even as she grieves the loss of her child.  Chaplain services remain available for follow-up spiritual/emotional support as needed.  This note is being duplicated in order to be reflected in both Girl Natsuko Kelsay and [employer].  Respectfully submitted, Rev. Mayme Genta, MDiv     07/28/21 2200  Clinical Encounter Type  Visited With Patient and family together  Visit Type Initial;Spiritual support;Other (Comment) (pt present at NICU code blue)  Spiritual Encounters  Spiritual Needs Prayer;Grief support

## 2021-07-28 NOTE — Lactation Note (Signed)
This note was copied from a baby's chart.  NICU Lactation Consultation Note  Patient Name: Girl Neta Upadhyay [id number] Date: 07/28/2021 Age:26 years  Subjective Reason for consult: Initial assessment; Primapara; 1st time breastfeeding; Preterm <34wks; Other (Comment); Infant < 6lbs; NICU baby (IQ 28-49)  Visited with mom of 20 hours old pre-term NICU female, she's a P1 and started pumping during [facility] consultation, praised her for her efforts. Ms. Schnapp was undecided yesterday regarding her feeding choice on admission (it was formula) but decided giving pumping a try after NICU admission. She practiced hand expression yesterday and voiced she's already getting some droplets of colostrum, praised her for her efforts. Reviewed pumping schedule, lactogenesis II and anticipatory guidelines.   Objective Infant data: Mother's Current Feeding Choice: Breast Milk and Donor Milk  Maternal data: G1P0101  C-Section, Low Transverse Significant Breast History:: (++) breast changes Current breast feeding challenges:: NICU admission Does the patient have breastfeeding experience prior to this delivery?: No Pumping frequency: q 3 hours (recommended) Flange Size: 27 Risk factor for low milk supply:: prematurity, HELLP syndrome, infant separation WIC Program: Yes WIC Referral Sent?: Yes  Assessment Infant: No data recorded Feeding Status: NPO  Maternal: Milk volume: Normal  Intervention/Plan Interventions: Breast feeding basics reviewed; DEBP; Education; LC Services brochure Tools: Pump; Flanges Pump Education: Setup, frequency, and cleaning; Milk Storage  Plan of care: Encouraged mom to start pumping every 3 hours, at least 8 pumping sessions/24 hours She'll start bringing any amount of EBM she might get to the NICU, family aware that bottles and labels will be provided by NICU staff Coconut oil was also advised to use prior pumping, MOB reported nipple sensitivity  GOB (maternal) present and  very supportive, she was explaining MOB some things LC pointed out during [facility] consultation. All questions and concerns answered, family to contact [facility] services PRN.  Consult Status: NICU follow-up NICU Follow-up type: New admission follow up; Maternal D/C visit; Verify onset of copious milk; Verify absence of engorgement   Zayne Draheim S Makail Watling 07/28/2021, 11:54 AM

## 2021-07-28 NOTE — Addendum Note (Signed)
Addendum  created 07/28/21 0951 by Daeshawn Redmann, Doree Fudge, CRNA   Flowsheet accepted, Intraprocedure Meds edited

## 2021-07-29 ENCOUNTER — Telehealth: Payer: Self-pay | Admitting: Family Medicine

## 2021-07-29 LAB — CBC
HCT: 27.7 % — ABNORMAL LOW (ref 36.0–46.0)
Hemoglobin: 9.6 g/dL — ABNORMAL LOW (ref 12.0–15.0)
MCH: 29.1 pg (ref 26.0–34.0)
MCHC: 34.7 g/dL (ref 30.0–36.0)
MCV: 83.9 fL (ref 80.0–100.0)
Platelets: 105 10*3/uL — ABNORMAL LOW (ref 150–400)
RBC: 3.3 MIL/uL — ABNORMAL LOW (ref 3.87–5.11)
RDW: 15.1 % (ref 11.5–15.5)
WBC: 12.8 10*3/uL — ABNORMAL HIGH (ref 4.0–10.5)
nRBC: 0 % (ref 0.0–0.2)

## 2021-07-29 LAB — COMPREHENSIVE METABOLIC PANEL
ALT: 33 U/L (ref 0–44)
AST: 22 U/L (ref 15–41)
Albumin: 2.3 g/dL — ABNORMAL LOW (ref 3.5–5.0)
Alkaline Phosphatase: 60 U/L (ref 38–126)
Anion gap: 6 (ref 5–15)
BUN: 5 mg/dL — ABNORMAL LOW (ref 6–20)
CO2: 25 mmol/L (ref 22–32)
Calcium: 7.2 mg/dL — ABNORMAL LOW (ref 8.9–10.3)
Chloride: 107 mmol/L (ref 98–111)
Creatinine, Ser: 0.94 mg/dL (ref 0.44–1.00)
GFR, Estimated: >60 mL/min (ref >60)
Glucose, Bld: 86 mg/dL (ref 70–99)
Potassium: 3.5 mmol/L (ref 3.5–5.1)
Sodium: 138 mmol/L (ref 135–145)
Total Bilirubin: 0.5 mg/dL (ref 0.3–1.2)
Total Protein: 5 g/dL — ABNORMAL LOW (ref 6.5–8.1)

## 2021-07-29 MED ORDER — IBUPROFEN 600 MG PO TABS: 600.0000 mg | ORAL_TABLET | Freq: Four times a day (QID) | ORAL | 1 refills | Status: DC

## 2021-07-29 MED ORDER — POTASSIUM CHLORIDE 20 MEQ PO PACK: 20.0000 meq | PACK | Freq: Every day | ORAL | 0 refills | Status: DC

## 2021-07-29 MED ORDER — NIFEDIPINE ER OSMOTIC RELEASE 30 MG PO TB24: 30.0000 mg | ORAL_TABLET | Freq: Every day | ORAL | 1 refills | Status: DC

## 2021-07-29 MED ORDER — HYDROMORPHONE HCL 2 MG PO TABS: 1.0000 mg | ORAL_TABLET | Freq: Four times a day (QID) | ORAL | 0 refills | Status: DC | PRN

## 2021-07-29 MED ORDER — FUROSEMIDE 20 MG PO TABS: 20.0000 mg | ORAL_TABLET | Freq: Every day | ORAL | 0 refills | Status: DC

## 2021-07-29 NOTE — Telephone Encounter (Signed)
Called patient's mother Marianna Fuss to express my sincerest condolences after learning that baby London died last night.  Also spoke with Margreta Journey and offered supportive words to both of them. They are all understandably devastated.  They are very appreciative of the care they received here at the hospital, especially from the antepartum nursing unit. Tanaya says she is glad she got her tubes tied, as she doesn't want to go through any of this again.  I will reach back out to them this week about arranging follow up with me in the clinic to continue supporting them. Advised she should keep her appointments with OB to follow up on her surgical wound and blood pressure.  Patient called back a few minutes later reporting that some part of her dressing came off. She was discharged from the hospital this AM. She could not describe well what happened or what part of the dressing came off - recommended she go to MAU to have dressing evaluated.  Latrelle Dodrill, MD

## 2021-07-31 ENCOUNTER — Encounter: Payer: Self-pay | Admitting: Lactation Services

## 2021-07-31 ENCOUNTER — Telehealth (HOSPITAL_COMMUNITY): Payer: Self-pay | Admitting: *Deleted

## 2021-07-31 ENCOUNTER — Ambulatory Visit (INDEPENDENT_AMBULATORY_CARE_PROVIDER_SITE_OTHER): Payer: Medicaid Other | Admitting: Lactation Services

## 2021-07-31 DIAGNOSIS — Z1331 Encounter for screening for depression: Secondary | ICD-10-CM

## 2021-07-31 DIAGNOSIS — Z0130 Encounter for examination of blood pressure without abnormal findings: Secondary | ICD-10-CM

## 2021-07-31 DIAGNOSIS — Z4889 Encounter for other specified surgical aftercare: Secondary | ICD-10-CM

## 2021-07-31 LAB — SURGICAL PATHOLOGY

## 2021-07-31 NOTE — Progress Notes (Signed)
   Subjective:    Patient ID: Lisa [name], female    DOB: 05/26/1995, 26 y.o.   MRN: [id number]  HPI    Review of Systems     Objective:   Physical Exam        Assessment & Plan:    

## 2021-07-31 NOTE — Addendum Note (Signed)
Addended by: Ed Blalock on: 07/31/2021 03:00 PM   Modules accepted: Level of Service

## 2021-07-31 NOTE — Patient Instructions (Addendum)
Patient showed up in office with concerns with post surgical pain. She was accompanied by her biological mother. Provena has detached. Dressing removed intact, incision clean dry and intact. No drainage noted.   Small amount bruising noted on the left upper abdomen, reviewed this is common post surgery. Patient reports burning to incision, reviewed this is a normal part of the healing process.   Per patient she did not pick up Dilauded . She has been taking Ibuprofen once a day. Reviewed to take Ibuprofen one tablet every 6 hours. Reviewed can alternate with 2 extra strength Tylenol every 6 hours as needed.   Initial BF 138/92, repeat is 136/85. She denies any headache, blurred vision or dizziness.   Reviewed wound care with patient and her mother.   Reviewed case with Dr. Para March who recommends that patient be seen in 1 week for follow up wound check and BP Check.   Patient returned to [facility] room to meet with Alontra Robinson-McNeil to meet with patient for clothing needs.

## 2021-07-31 NOTE — Progress Notes (Signed)
   Subjective:    Patient ID: Lisa [name], female    DOB: [birth year], 26 y.o.   MRN: [id number]  HPI    Review of Systems     Objective:   Physical Exam        Assessment & Plan:

## 2021-07-31 NOTE — Telephone Encounter (Signed)
EPDS score on 07/28/21 in the hospital was 14. Referral for Integrated Behavioral Health sent to Dr. Catalina Antigua for review. Deforest Hoyles, RN, 07/31/21, [phone]

## 2021-08-03 ENCOUNTER — Ambulatory Visit: Payer: Medicaid Other

## 2021-08-04 ENCOUNTER — Telehealth: Payer: Self-pay | Admitting: Family Medicine

## 2021-08-04 ENCOUNTER — Encounter: Payer: Medicaid Other | Admitting: Obstetrics and Gynecology

## 2021-08-04 NOTE — Telephone Encounter (Signed)
Patient calls nurse line. Patient reports that she is having a very difficult time with the loss of her daughter. Reports that she does have thoughts of self harm when she thinks about everything that has happened. No current plan.   She also reports that she is afraid to fall asleep due to fears of being awoken to bad news again.   Patient is tearful and is requesting to speak with Dr. Pollie Meyer. Transferred patient to directly speak with provider.   Veronda Prude, RN

## 2021-08-04 NOTE — Telephone Encounter (Signed)
Patient asking for Dr. Pollie Meyer to give her a call to discuss some counseling services that can help her. She's not coping with the recent loss of her baby and would like some help. Please call patient back to discuss.

## 2021-08-04 NOTE — Telephone Encounter (Signed)
Spoke with patient on the phone.  She has been having a very hard time with the loss of baby London. Reports she has had thoughts of hurting herself, including walking into the street and being hit by a car, or slicing her wrists. She thinks she would actually slice her wrists. Feels this way especially when she is alone without other adults there. Has not told her mother, as the family is going through a lot with the death of her daughter, and also her older brother apparently attempted to hurt himself in the last week.  At the start of the phone call her sister was present, but at the end of it patient was at home alone with her two young nephews. Patient gave me permission to conference in her mother Marianna Fuss, who agreed to take patient to [facility]. I stayed on the phone with patient until Marianna Fuss arrived at the home. They were going to head directly to [facility].  I called [facility] and relayed this information to the triage unit. Total time on phone with patient was 40 minutes.  Latrelle Dodrill, MD

## 2021-08-07 ENCOUNTER — Ambulatory Visit (INDEPENDENT_AMBULATORY_CARE_PROVIDER_SITE_OTHER): Payer: Medicaid Other

## 2021-08-07 ENCOUNTER — Ambulatory Visit: Payer: Medicaid Other

## 2021-08-07 ENCOUNTER — Other Ambulatory Visit: Payer: Self-pay

## 2021-08-07 ENCOUNTER — Ambulatory Visit: Payer: Self-pay

## 2021-08-07 VITALS — BP 137/104 | HR 88 | Wt 226.2 lb

## 2021-08-07 DIAGNOSIS — R030 Elevated blood-pressure reading, without diagnosis of hypertension: Secondary | ICD-10-CM

## 2021-08-07 MED ORDER — BLOOD PRESSURE KIT DEVI: 1.0000 | 0 refills | Status: DC

## 2021-08-07 MED ORDER — HYDROCHLOROTHIAZIDE 12.5 MG PO TABS: 25.0000 mg | ORAL_TABLET | Freq: Every day | ORAL | 2 refills | Status: DC

## 2021-08-07 NOTE — Progress Notes (Signed)
Blood Pressure Check Visit  Lisa [name] is here for blood pressure check following c-section delivery on 07/27/21. BP today is 132/89. BP recheck approximately 5-10 minutes later 137/104. Patient endorses some dizziness and blurred vision. Patient takes 30 mg of Nifedipine daily with her last dose taken this morning. Reviewed with Dr. Alysia Penna. Per Dr. Alysia Penna patient should continue taking 30 mg daily of Nifedipine and begin taking 25 mg daily of Hydrochlorothiazide with a BP recheck in 2 weeks. Prescription sent to patient's pharmacy. Reviewed this with patient and her mother, both verbalized understanding.  Patient is also here today for an incision check. Incision site observed to be open to air. Incision site assessed to be clean, dry and intact. No bruising, drainage, redness or swelling noted. Patient states her pain is well controlled.   Cline Crock, RN 08/07/2021

## 2021-08-08 NOTE — BH Specialist Note (Signed)
Integrated Behavioral Health via Telemedicine Visit  08/22/2021 Lisa [name] [id number]  Number of Integrated Behavioral Health Clinician visits: 1- Initial Visit  Session Start time: 1431   Session End time: 1527  Total time in minutes: 56   Referring Provider: Catalina Antigua, MD Patient/Family location: Home  [facility] Provider location: [facility]  All persons participating in visit: Patient Lisa [name] and [facility] Hulda Marin and Alica's mother  Types of Service: Individual psychotherapy and Video visit  I connected with Lisa [name] and/or Lisa [name] mother via  Telephone or Video Enabled Telemedicine Application  (Video is Caregility application) and verified that I am speaking with the correct person using two identifiers. Discussed confidentiality: Yes   I discussed the limitations of telemedicine and the availability of in person appointments.  Discussed there is a possibility of technology failure and discussed alternative modes of communication if that failure occurs.  I discussed that engaging in this telemedicine visit, they consent to the provision of behavioral healthcare and the services will be billed under their insurance.  Patient and/or legal guardian expressed understanding and consented to Telemedicine visit: Yes   Presenting Concerns: Patient and/or family reports the following symptoms/concerns: Grieving loss of pregnancy, while experiencing nausea and headaches (has not eaten in four days);  pt wishes to have another baby. Pt's mother (legal guardian) wants pt to start back on BH medication (Abilify; Seroquel; another medication) to manage mood swings.  Duration of problem: After loss  Patient and/or Family's Strengths/Protective Factors: Social connections, Concrete supports in place (healthy food, safe environments, etc.), and Sense of purpose  Goals Addressed: Patient will:    Demonstrate ability to: Increase adequate support systems for patient/family and Continue healthy grieving over loss  Progress towards Goals: Ongoing  Interventions: Interventions utilized:  Link to [employer] and Supportive Reflection Standardized Assessments completed: Not Needed  Patient and/or Family Response: Patient agrees with treatment plan.   Assessment: Patient currently experiencing Grief today.   Patient may benefit from psychoeducation and brief therapeutic interventions regarding coping with current grief; to re-establish care with psychiatry for [facility] medication management .  Plan: Follow up with behavioral health clinician on : Two weeks Behavioral recommendations:  --Continue prioritizing healthy self-care (regular meals, adequate rest; allowing practical help from supportive friends and family) until at least postpartum medical appointment -Consider pregnancy loss support group as needed at either [email or web address] or [email or web address]  -Accept referral to [facility]; use walk-in hours as needed  Referral(s): Integrated Art gallery manager (In Clinic), Community Mental Health Services (LME/Outside Clinic), and Community Resources:  pregnancy loss support  I discussed the assessment and treatment plan with the patient and/or parent/guardian. They were provided an opportunity to ask questions and all were answered. They agreed with the plan and demonstrated an understanding of the instructions.   They were advised to call back or seek an in-person evaluation if the symptoms worsen or if the condition fails to improve as anticipated.  Rae Lips, LCSW     07/28/2021    2:13 PM  Edinburgh Postnatal Depression Scale Screening Tool  I have been able to laugh and see the funny side of things. 1  I have looked forward with enjoyment to things. 0  I have blamed myself unnecessarily when things went wrong. 2  I have been anxious or worried for no  good reason. 3  I have felt scared or panicky for no good reason. 2  Things  have been getting on top of me. 2  I have been so unhappy that I have had difficulty sleeping. 1  I have felt sad or miserable. 2  I have been so unhappy that I have been crying. 1  The thought of harming myself has occurred to me. 0  Edinburgh Postnatal Depression Scale Total 14

## 2021-08-10 ENCOUNTER — Telehealth: Payer: Self-pay

## 2021-08-10 NOTE — Telephone Encounter (Signed)
[place], [facility]s  P [facility] Htn Md; P [facility] Clinical Pool Babyscripts Trigger Notification for [name], [name]  Trigger Severity: Elevated  Blood Pressure Value: 129/101  Reading Date: 2021-08-10  Symptoms: Abdominal Pain, Headache, Abnormal Vision   Call placed to pt. Spoke with pt. Pt states having a severe headache, visual changes and overall not feeling well. Pt advised to go back to [facility] to be seen. Pt delivered on 07/27/21. Pt is taking HCTZ and Procardia Rx as directed. Pt agreeable to go to [facility] now for evaluation.  Lisa [name],RNC

## 2021-08-21 ENCOUNTER — Ambulatory Visit: Payer: Medicaid Other

## 2021-08-22 ENCOUNTER — Ambulatory Visit: Payer: Medicaid Other | Admitting: Clinical

## 2021-08-22 ENCOUNTER — Telehealth: Payer: Self-pay

## 2021-08-22 DIAGNOSIS — F4321 Adjustment disorder with depressed mood: Secondary | ICD-10-CM

## 2021-08-22 NOTE — Patient Instructions (Signed)
[facility] at [facility] [address] [place], [place] [address] [phone] (main office) [phone] (Lawson Isabell's office)  [email or web address] (pregnancy loss support)  [facility]  [address] [place], [place] [address] [phone] or [phone] [facility] 24/7 FOR ANYONE [address] [place], [place]  [phone] Fax: [phone] [email or web address] *Interpreters available *Accepts all insurance and uninsured for Urgent Care needs *Accepts Medicaid and uninsured for outpatient treatment (below)    ONLY FOR [facility]  Below:   Outpatient New Patient Assessment/Therapy Walk-ins:        Monday -Thursday 8am until slots are full.        Every Friday 1pm-4pm  (first come, first served)                   New Patient Psychiatry/Medication Management        Monday-Friday 8am-11am (first come, first served)              For all walk-ins we ask that you arrive by 7:15am, because patients will be seen in the order of arrival.

## 2021-08-22 NOTE — Telephone Encounter (Signed)
Patient calls nurse line for (2) reasons.   (1) Patient would like to know if she can have children post Bilateral Salpingectomy. Patient reports she was "told" by a family/friend that there is still a possibility she could "get pregnant" in the future.   (2) Patient reports her mother told her she has "herpes." However, patient stated she does not. Patient reports she was told in the hospital she does not have herpes.    Will forward with PCP to discuss with patient.

## 2021-08-23 ENCOUNTER — Ambulatory Visit: Payer: Medicaid Other

## 2021-08-23 NOTE — Telephone Encounter (Signed)
Returned call to patient Her mom answered the phone but I was able to say hi to [place] over speaker Julissa had no specific questions for me Scheduled follow up with me on 9/14 Mom and patient appreciative.  Latrelle Dodrill, MD

## 2021-08-23 NOTE — Telephone Encounter (Signed)
Also of note, patient is seeing counselor through [place] office.

## 2021-08-24 NOTE — Patient Instructions (Addendum)
Thank you for attending your appointment today.  -- START Abilify 5 mg daily. If tolerating well, after 1 week INCREASE to 10 mg daily. -- START trazodone 25-50 mg nightly as needed for sleep. -- Please continue other medications as prescribed.  Please do not make any changes to medications without first discussing with your provider. If you are experiencing a psychiatric emergency, please call 911 or present to your nearest emergency department. Additional crisis, medication management, and therapy resources are included below.  [facility]  [address] [place], [place] [address] [phone] or [phone] [facility] 24/7 FOR ANYONE [address], [place], [place]  [phone] Fax: [phone] [email or web address] *Interpreters available *Accepts all insurance and uninsured for Urgent Care needs *Accepts Medicaid and uninsured for outpatient treatment (below)      ONLY FOR [facility]  Below:    Outpatient New Patient Assessment/Therapy Walk-ins:        Monday -Thursday 8am until slots are full.        Every Friday 1pm-4pm  (first come, first served)                   New Patient Psychiatry/Medication Management        Monday-Friday 8am-11am (first come, first served)               For all walk-ins we ask that you arrive by 7:15am, because patients will be seen in the order of arrival.
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
## 2021-08-25 ENCOUNTER — Ambulatory Visit: Payer: Medicaid Other

## 2021-08-25 ENCOUNTER — Ambulatory Visit: Payer: Self-pay | Admitting: Cardiology

## 2021-08-25 ENCOUNTER — Encounter (HOSPITAL_COMMUNITY): Payer: Self-pay | Admitting: Psychiatry

## 2021-08-25 ENCOUNTER — Ambulatory Visit (INDEPENDENT_AMBULATORY_CARE_PROVIDER_SITE_OTHER): Payer: Medicaid Other | Admitting: Psychiatry

## 2021-08-25 DIAGNOSIS — F5105 Insomnia due to other mental disorder: Secondary | ICD-10-CM

## 2021-08-25 DIAGNOSIS — F4310 Post-traumatic stress disorder, unspecified: Secondary | ICD-10-CM

## 2021-08-25 DIAGNOSIS — Z79899 Other long term (current) drug therapy: Secondary | ICD-10-CM

## 2021-08-25 DIAGNOSIS — F639 Impulse disorder, unspecified: Secondary | ICD-10-CM | POA: Diagnosis not present

## 2021-08-25 DIAGNOSIS — F79 Unspecified intellectual disabilities: Secondary | ICD-10-CM | POA: Diagnosis not present

## 2021-08-25 DIAGNOSIS — F333 Major depressive disorder, recurrent, severe with psychotic symptoms: Secondary | ICD-10-CM

## 2021-08-25 DIAGNOSIS — F99 Mental disorder, not otherwise specified: Secondary | ICD-10-CM

## 2021-08-25 DIAGNOSIS — F902 Attention-deficit hyperactivity disorder, combined type: Secondary | ICD-10-CM

## 2021-08-25 MED ORDER — TRAZODONE HCL 50 MG PO TABS: ORAL_TABLET | ORAL | 0 refills | Status: DC

## 2021-08-25 MED ORDER — ARIPIPRAZOLE 5 MG PO TABS: ORAL_TABLET | ORAL | 0 refills | Status: DC

## 2021-08-28 NOTE — BH Specialist Note (Unsigned)
Pt's mother answered phone, did not talk to [name]; pt's mother says this is not a good time for the visit as they have a lot going on now. Pt's mother agrees for [name] to call Asher Muir at [phone] as needed.

## 2021-08-31 ENCOUNTER — Ambulatory Visit: Payer: Self-pay | Admitting: Family Medicine

## 2021-09-07 ENCOUNTER — Ambulatory Visit: Payer: Medicaid Other | Admitting: Clinical

## 2021-09-07 DIAGNOSIS — Z91199 Patient's noncompliance with other medical treatment and regimen due to unspecified reason: Secondary | ICD-10-CM

## 2021-09-14 ENCOUNTER — Other Ambulatory Visit: Payer: Self-pay

## 2021-09-14 ENCOUNTER — Ambulatory Visit (INDEPENDENT_AMBULATORY_CARE_PROVIDER_SITE_OTHER): Payer: Medicaid Other | Admitting: Obstetrics and Gynecology

## 2021-09-14 ENCOUNTER — Encounter: Payer: Self-pay | Admitting: Obstetrics and Gynecology

## 2021-09-14 ENCOUNTER — Other Ambulatory Visit (HOSPITAL_COMMUNITY)
Admission: RE | Admit: 2021-09-14 | Discharge: 2021-09-14 | Disposition: A | Discharge status: Home / Self Care | Payer: Medicaid Other | Source: Ambulatory Visit | Attending: Family Medicine | Admitting: Family Medicine

## 2021-09-14 DIAGNOSIS — Z9079 Acquired absence of other genital organ(s): Secondary | ICD-10-CM | POA: Diagnosis not present

## 2021-09-14 DIAGNOSIS — I10 Essential (primary) hypertension: Secondary | ICD-10-CM | POA: Insufficient documentation

## 2021-09-14 NOTE — Progress Notes (Signed)
    Post Partum Visit Note  Lisa [name] is a 26 y.o. G20P0101 female who presents for a postpartum visit. She is several weeks postpartum following a primary cesarean section.  I have fully reviewed the prenatal and intrapartum course. The delivery was at [redacted]w[redacted]d gestational weeks due to [facility] and IUGR.  Anesthesia: spinal. Postpartum course has been unremarkable.Bleeding no bleeding. Bowel function is normal. Bladder function is normal. Patient is sexually active. Contraception method is tubal ligation. Postpartum depression screening: positive. Infant passed after a few days after delivery.  The pregnancy intention screening data noted above was reviewed. Potential methods of contraception were discussed. The patient elected to proceed with No data recorded.    Health Maintenance Due  Topic Date Due   COVID-19 Vaccine (5 - Pfizer risk series) 03/01/2021   INFLUENZA VACCINE  08/08/2021    Medical records  Review of Systems Pertinent items noted in HPI and remainder of comprehensive ROS otherwise negative.  Objective:  LMP 01/24/2021    General:  alert   Breasts:  not indicated  Lungs: clear to auscultation bilaterally  Heart:  regular rate and rhythm, S1, S2 normal, no murmur, click, rub or gallop  Abdomen: soft, non-tender; bowel sounds normal; no masses,  no organomegaly   Wound well approximated incision  GU exam:  not indicated       Assessment:    There are no diagnoses linked to this encounter.  Nl postpartum exam.  HTN  Plan:   Essential components of care per ACOG recommendations:  1.  Mood and well being: Patient with positive depression screening today. Reviewed local resources for support.  - Patient tobacco use? No.   - hx of drug use? No.    2. Infant care and feeding:  -Patient currently breastmilk feeding? No.  -Social determinants of health (SDOH) reviewed in Epic.  3. Sexuality, contraception and birth spacing - Patient does not want a pregnancy  in the next year.  Desired family size is completed  - Reviewed reproductive life planning. Reviewed contraceptive methods based on pt preferences and effectiveness.  Patient desired Female Sterilization today.   - Discussed birth spacing of 18 months  4. Sleep and fatigue -Encouraged family/partner/community support of 4 hrs of uninterrupted sleep to help with mood and fatigue  5. Physical Recovery  - Discussed patients delivery and complications. She describes her labor as mixed. - Patient had a C-section.  - Patient has urinary incontinence? No. - Patient is safe to resume physical and sexual activity  6.  Health Maintenance - HM due items addressed Yes - Last pap smear  Diagnosis  Date Value Ref Range Status  04/06/2021 (A)  Final   - Atypical squamous cells of undetermined significance (ASC-US)   Pap smear not done at today's visit.  -Breast Cancer screening indicated? No.   7. Chronic Disease/Pregnancy Condition follow up: Hypertension  - PCP follow up for HTN and depression. Pt also sees Florentina Jenny, MD [facility] [employer]

## 2021-09-14 NOTE — Patient Instructions (Signed)

## 2021-09-15 ENCOUNTER — Telehealth (INDEPENDENT_AMBULATORY_CARE_PROVIDER_SITE_OTHER): Payer: Medicaid Other | Admitting: Psychiatry

## 2021-09-15 ENCOUNTER — Encounter (HOSPITAL_COMMUNITY): Payer: Self-pay | Admitting: Psychiatry

## 2021-09-15 DIAGNOSIS — F4310 Post-traumatic stress disorder, unspecified: Secondary | ICD-10-CM | POA: Diagnosis not present

## 2021-09-15 DIAGNOSIS — Z79899 Other long term (current) drug therapy: Secondary | ICD-10-CM | POA: Diagnosis not present

## 2021-09-15 DIAGNOSIS — F333 Major depressive disorder, recurrent, severe with psychotic symptoms: Secondary | ICD-10-CM

## 2021-09-15 DIAGNOSIS — F639 Impulse disorder, unspecified: Secondary | ICD-10-CM | POA: Diagnosis not present

## 2021-09-15 DIAGNOSIS — F902 Attention-deficit hyperactivity disorder, combined type: Secondary | ICD-10-CM | POA: Diagnosis not present

## 2021-09-15 DIAGNOSIS — F79 Unspecified intellectual disabilities: Secondary | ICD-10-CM

## 2021-09-15 LAB — CERVICOVAGINAL ANCILLARY ONLY
Bacterial Vaginitis (gardnerella): POSITIVE — AB
Candida Glabrata: NEGATIVE
Candida Vaginitis: NEGATIVE
Chlamydia: NEGATIVE
Comment: NEGATIVE
Comment: NEGATIVE
Comment: NEGATIVE
Comment: NEGATIVE
Comment: NEGATIVE
Comment: NORMAL
Neisseria Gonorrhea: POSITIVE — AB
Trichomonas: NEGATIVE

## 2021-09-15 MED ORDER — GUANFACINE HCL ER 1 MG PO TB24
1.0000 mg | ORAL_TABLET | Freq: Every evening | ORAL | 2 refills | Status: DC
Start: 2021-09-15 — End: 2022-03-05

## 2021-09-15 MED ORDER — ARIPIPRAZOLE 15 MG PO TABS: 15.0000 mg | ORAL_TABLET | Freq: Every day | ORAL | 2 refills | Status: DC

## 2021-09-15 NOTE — Patient Instructions (Addendum)
Thank you for attending your appointment today.  -- INCREASE Abilify to 15 mg daily -- START guanfacine (Intuniv) 1 mg nightly -- Continue other medications as prescribed  Please do not make any changes to medications without first discussing with your provider. If you are experiencing a psychiatric emergency, please call 911 or present to your nearest emergency department. Additional crisis, medication management, and therapy resources are included below.  [facility]  [address], [place], [place] [address] [phone] or [phone] [facility] 24/7 FOR ANYONE [address] [place], [place]  [phone] Fax: [phone] [email or web address] *Interpreters available *Accepts all insurance and uninsured for Urgent Care needs *Accepts Medicaid and uninsured for outpatient treatment (below)      ONLY FOR [facility]  Below:    Outpatient New Patient Assessment/Therapy Walk-ins:        Monday -Thursday 8am until slots are full.        Every Friday 1pm-4pm  (first come, first served)                   New Patient Psychiatry/Medication Management        Monday-Friday 8am-11am (first come, first served)               For all walk-ins we ask that you arrive by 7:15am, because patients will be seen in the order of arrival.
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
## 2021-09-15 NOTE — BH Specialist Note (Unsigned)
Integrated Behavioral Health Follow Up In-Person Visit  MRN: [id number] Name: Lisa [name]  Number of Integrated Behavioral Health Clinician visits: 2- Second Visit  Session Start time: 1524   Session End time: 1548  Total time in minutes: 24   Types of Service: Individual psychotherapy  Interpretor:No. Interpretor Name and Language: n/a  Subjective: Lisa [name] is a 26 y.o. female accompanied by Mother and Siblings Patient was referred by Lisa Antigua, MD for grief and positive depression screen. Patient reports the following symptoms/concerns: Grieving loss of baby, along with other life stressors. Duration of problem: Less than two months  Objective: Mood: NA and Affect: Appropriate Risk of harm to self or others: No plan to harm self or others  Life Context: Family and Social: Pt lives with mother and siblings School/Work: - Self-Care: - Life Changes: Recent loss of baby; housing instability  Patient and/or Family's Strengths/Protective Factors: Social connections and Sense of purpose  Goals Addressed: Patient will:    Demonstrate ability to: Increase healthy adjustment to current life circumstances  Progress towards Goals: Ongoing  Interventions: Interventions utilized:  Link to [employer] and Supportive Reflection Standardized Assessments completed: Not Needed  Patient and/or Family Response: Patient agrees with treatment plan.   Patient Centered Plan: Patient is on the following Treatment Plan(s): IBH Assessment: Patient currently experiencing Grief and Psychosocial stress.   Patient may benefit from continued therapeutic interventions.  Plan: Follow up with behavioral health clinician on : Two weeks Behavioral recommendations:  -Continue allowing time and space to grieve -Accept referral to [employer] today -Use information on After Visit Summary today as needed Referral(s): Integrated Art gallery manager (In Clinic)  and [employer] Resources:  Food and Housing  Lisa [name] [place], [place]

## 2021-09-18 ENCOUNTER — Ambulatory Visit (INDEPENDENT_AMBULATORY_CARE_PROVIDER_SITE_OTHER): Payer: Medicaid Other | Admitting: Family Medicine

## 2021-09-18 ENCOUNTER — Ambulatory Visit: Payer: Medicaid Other

## 2021-09-18 ENCOUNTER — Encounter: Payer: Self-pay | Admitting: *Deleted

## 2021-09-18 ENCOUNTER — Encounter: Payer: Self-pay | Admitting: Family Medicine

## 2021-09-18 ENCOUNTER — Telehealth: Payer: Self-pay | Admitting: *Deleted

## 2021-09-18 VITALS — BP 131/98 | HR 86 | Ht 62.0 in | Wt 228.5 lb

## 2021-09-18 DIAGNOSIS — A549 Gonococcal infection, unspecified: Secondary | ICD-10-CM

## 2021-09-18 DIAGNOSIS — Z9109 Other allergy status, other than to drugs and biological substances: Secondary | ICD-10-CM

## 2021-09-18 MED ORDER — FLUTICASONE PROPIONATE 50 MCG/ACT NA SUSP: NASAL | 2 refills | Status: DC

## 2021-09-18 MED ORDER — CEFTRIAXONE SODIUM 500 MG IJ SOLR
500.0000 mg | Freq: Once | INTRAMUSCULAR | Status: AC
  Administered 2021-09-18: 500 mg via INTRAMUSCULAR

## 2021-09-18 NOTE — Patient Instructions (Addendum)
It was great to see you today! Thank you for choosing [facility] for your primary care. Lisa [name] was seen for gonorrhea treatment.  Updates for today: You received an antibiotic shot today for gonorrhea I have also refilled your Flonase for your allergies Please give [place] a call if you start to have worsening of your symptoms including fever above 100.4, severe abdominal pain, trouble breathing, chest pain Please attend your upcoming visit with Dr. Pollie Meyer  If you haven't already, sign up for My Chart to have easy access to your labs results, and communication with your primary care physician.  Call the clinic at [phone] if your symptoms worsen or you have any concerns.  You should return to our clinic Return if symptoms worsen or fail to improve. Please arrive 15 minutes before your appointment to ensure smooth check in process.  We appreciate your efforts in making this happen.  Thank you for allowing me to participate in your care, Vonna Drafts, MD 09/18/2021, 4:54 PM PGY-1, [facility]

## 2021-09-18 NOTE — Progress Notes (Cosign Needed Addendum)
    SUBJECTIVE:   CHIEF COMPLAINT / HPI:   Gonorrhea Patient presents today after testing positive for gonorrhea.  Reports having unprotected sex with a partner a few weeks ago who informed her that he had an STD.  Has not had any abnormal vaginal discharge, dysuria, abdominal pain, nausea/vomiting, shortness of breath, chest pain, fevers.  Of note, patient is living in a hotel with her mother due to mold in their place of residence.  Mother called into the visit today via phone and reported that they cannot afford any medication co-pays currently.  Pollen allergy Patient states her allergies have been acting up.  Has responded well to Flonase in the past.  Requesting refill of this.  Denies shortness of breath.  PERTINENT  PMH / PSH: Gonorrhea  OBJECTIVE:   BP (!) 131/98   Pulse 86   Ht 5\' 2"  (1.575 m)   Wt 228 lb 8 oz (103.6 kg)   LMP 09/05/2021 (Approximate)   SpO2 100%   BMI 41.79 kg/m   Gen: Well-appearing, NAD CV: RRR no MRG Pulm: CTAB normal WOB on RA Abd: Soft, nondistended, mild tenderness to deep palpation of right lower quadrant.  Otherwise nontender  ASSESSMENT/PLAN:   Pollen allergies States her allergies are acting up.  States Flonase has helped her in the past. -Refilled Flonase  Gonorrhea Presents today after testing positive for gonorrhea.  Unprotected sex with partner a few weeks ago, no longer with this partner.  Denies any vaginal discharge, pelvic or abdominal pain, dysuria, foul odor.  Also tested positive for BV, but given her lack of symptoms and difficulty with medication co-pay will hold off on treating this.  Patient is allergic to amoxicillin but has received IM Rocephin in the past for gonorrhea with no reaction. -IM Rocephin 500 mg injection today, monitor for 15 minutes due to history of her allergy to penicillin.     09/07/2021, MD [facility]

## 2021-09-18 NOTE — Assessment & Plan Note (Signed)
States her allergies are acting up.  States Flonase has helped her in the past. -Refilled Flonase

## 2021-09-18 NOTE — Telephone Encounter (Signed)
-----   Message from Hermina Staggers, MD sent at 09/15/2021  6:30 PM EDT ----- Please have pt come in to office for treatment of her gonorrhea. Please advise pt to have partner evaluated as well TOC in 3-4 weeks Please send in Rx for BV as well.  Thanks Casimiro Needle

## 2021-09-18 NOTE — Assessment & Plan Note (Addendum)
Presents today after testing positive for gonorrhea.  Unprotected sex with partner a few weeks ago, no longer with this partner.  Denies any vaginal discharge, pelvic or abdominal pain, dysuria, foul odor.  Also tested positive for BV, but given her lack of symptoms and difficulty with medication co-pay will hold off on treating this.  Patient is allergic to amoxicillin but has received IM Rocephin in the past for gonorrhea with no reaction. -IM Rocephin 500 mg injection today, monitor for 15 minutes due to history of her allergy to penicillin.

## 2021-09-18 NOTE — Telephone Encounter (Addendum)
I called Lisa [name] and she informed me that she has an appointment today at 4 at [facility] to get treated for  STD ( Gonorrhea). Mom said she can't. I explained they are part of [facility] and can see results and can treat her ; but if there is a problem , we can make an appointment and treat her for Gonorrhea, to let us know. She states she will. STD form sent to health department per protocol.  Nancy Fetter

## 2021-09-19 ENCOUNTER — Other Ambulatory Visit: Payer: Self-pay

## 2021-09-19 ENCOUNTER — Ambulatory Visit: Payer: Medicaid Other | Admitting: Clinical

## 2021-09-19 DIAGNOSIS — Z658 Other specified problems related to psychosocial circumstances: Secondary | ICD-10-CM

## 2021-09-19 DIAGNOSIS — F4321 Adjustment disorder with depressed mood: Secondary | ICD-10-CM

## 2021-09-19 NOTE — Patient Instructions (Addendum)
Center for [facility] for Women [address] [place], [place] [address] [phone] (main office) [phone] (Rumi Kolodziej's office)  /Emotional Wellbeing Apps and Websites Here are a few free apps meant to help you to help yourself.  To find, try searching on the internet to see if the app is offered on Apple/Android devices. If your first choice doesn't come up on your device, the good news is that there are many choices! Play around with different apps to see which ones are helpful to you.    Calm This is an app meant to help increase calm feelings. Includes info, strategies, and tools for tracking your feelings.      Calm Harm  This app is meant to help with self-harm. Provides many 5-minute or 15-min coping strategies for doing instead of hurting yourself.       Healthy Minds Health Minds is a problem-solving tool to help deal with emotions and cope with stress you encounter wherever you are.      MindShift This app can help people cope with anxiety. Rather than trying to avoid anxiety, you can make an important shift and face it.      MY3  MY3 features a support system, safety plan and resources with the goal of offering a tool to use in a time of need.       My Life My Voice  This mood journal offers a simple solution for tracking your thoughts, feelings and moods. Animated emoticons can help identify your mood.       Relax Melodies Designed to help with sleep, on this app you can mix sounds and meditations for relaxation.      Smiling Mind Smiling Mind is meditation made easy: it's a simple tool that helps put a smile on your mind.        Stop, Breathe & Think  A friendly, simple guide for people through meditations for mindfulness and compassion.  Stop, Breathe and Think Kids Enter your current feelings and choose a "mission" to help you cope. Offers videos for certain moods instead of just sound recordings.       Team  Orange The goal of this tool is to help teens change how they think, act, and react. This app helps you focus on your own good feelings and experiences.      The United Stationers Box The United Stationers Box (VHB) contains simple tools to help patients with coping, relaxation, distraction, and positive thinking.     Housing Resources                    [employer] (serves [place], [place], [place], [place], [place], [place], [place], [place], [place], [place], [place], [place], and [place] counties) [address] [place], [place] [address] [phone] DeveloperU.ch  **Rental assistance, Home Rehabilitation,Weatherization Assistance Program, Chief Financial Officer, Housing Voucher Program  [employer] for Housing and [employer] Studies: Proofreader Resources to residents of [place], [place], and [place] Make sure you have your documents ready, including:  (Household income verification: 2 months pay stubs, unemployment/social security award letter, statement of no income for all household members over 67) Photo ID for all household members over 18 Utility Bill/Rent Ledger/Lease: must show past due amount for utilities/rent, or the rental agreement if rent is current 2. Start your application online or by paper (in [place] or [place]) at:     [email or web address].[email or web address]  3. Once you have completed  the online application, you will get an email confirmation message from the county. Expect to hear back by phone or email at least 6-10 weeks from submitting your application.  4. While you wait:  Call [phone] to check in on your application Let your landlord know that you've applied. Your landlord will be asked to submit documents (W-9) during this application process. Payments will be made directly to the landlord/property management company and utility  company Rent or utility assistance for [employer], [place], and [place] Apply at [email or web address] Questions? Call or email Renee at [phone] or [email or web address]   Eviction Mediation Program: The HOPE Program Https://www.rebuild.BedroomRental.com.cy HOPE Progam serves low-income renters in [address] [place] counties, defined as less than or equal to 80% of the area median income for the county where the renter lives. In the following 12 counties, you should apply to your local rent and utility assistance program INSTEAD OF the HOPE Program: [place], [place], [place], [place], [place], [place], [place], [place], [place], [place], [place], [place]  If you live outside of [place], contact HOPE call center at [phone] to talk to a Program Representative Monday-Friday, 8am-5pm Note that Native American tribes also received federal funding for rent and utility assistance programs. Recognized members of the following tribes will be served by programs managed by tribal governments, including: [place] Indians, [place], [place], [place] of [place] and [facility] Eviction Mediation Coordinator, [place], [phone] [email or web address] .[employer], [place], [phone] [email or web address]   Housing Resources [facility] [place] [address] [place], [place] [address] [phone] www.gha-[place].[facility] [address] Tawny Hopping [place], [place] [address] [phone] PhoneCaptions.ch **Programs include: Hospital doctor and Housing Counseling, Healthy Radiographer, therapeutic, Homeless Prevention and Housing Assistance  Government [facility] [address], [address], [place] [address] [phone] www.PaintingEmporium.co.za **housing applications/recertification; tax payment relief/exemption under  specific qualifications  [facility] [address], [place], [place] [address] www.onlinegreensboro.com/~maryshouse **transitional housing for women in recovery who have minor children or are pregnant  [facility] [address] [place], [place], [place] [address] https://johnson-smith.net/  **emergency shelter and support services for families facing homelessness  [facility] [address], [place], [place] [address] [phone] www.youthfocus.org **transitional housing to pregnant women; emergency housing for youth who have run away, are experiencing a family crisis, are victims of abuse or neglect, or are homeless  [facility] [address] [place], [place] [address] [phone] ircgso.org **Drop-in center for people experiencing homelessness; overnight warming center when temperature is 25 degrees or below  Re-Entry Staffing [address] [place], [place], [place] [address] [phone] https://reentrystaffingagency.org/ **help with affordable housing to people experiencing homelessness or unemployment due to incarceration  [facility] [address], [place], [place] [address] [phone] www.greensborourbanministry.org  **emergency and transitional housing, rent/mortgage assistance, utility assistance  [facility] [address], [place], [place] [address] [phone] www.salvationarmyofgreensboro.org **emergency and transitional housing  Habitat for [employer] [address], [place], [place] [address] [phone] Www.habitatgreensboro.[facility]   [employer] [address], [place], [place] [address] [phone] https://chshousing.org **Home Ownership/Affordable Housing Program and [facility]  [facility]ousing [employer]ants [facility] [address], [place], [place] [address] [phone] PaidValue.com.cy **home buyer  education courses, foreclosure prevention  [facility] [address] [place], [place] [address] [phone] DefMagazine.is **Environmental Exposure Assessment (investigation of homes where either children or pregnant  women with a confirmed elevated blood lead level reside)  [facility] [address] Nat Math [place], [place] [address] [phone] ShowReturn.ca **Home Expense Assistance/Repairs Program; offers home accessibility updates, such as ramps or bars in the bathroom  [facility]Help [facility] [address], [place], [place] [address] [phone] https://www.self-help.org/locations/[place]-branch **Offers credit-building and banking services to people unable to use traditional banking   Housing Resources [facility]  Housing Authority- [place] [facility] [address] Cinda Quest [place], [place] [address] [phone] ChatRepair.pl   [facility] [address], [place], [place] [address] [phone]  WrestlingMonthly.pl **housing applications/recertification, emergency and transitional housing  Open [employer] of [employer] [address] [place], [place] [address] [phone] www.odm-hp.org  **emergency and permanent housing; rent/mortgage payment assistance  Habitat for [employer], [place] and [place] [address], [place], [place] [address] [phone] https://russell-walls.com/  [facility] Services of the [place], [place] [address] [place], [place], [place] [address] www.familyservice-piedmont.org **emergency shelter for victims of domestic violence and sexual assault  [facility] [address], [place], [place] [address] [phone] www.senior-resources-guilford.org **Home expense assistance/repairs for older adults  Housing  Resources [facility] [place] [address], [place], [place] [address] [phone] http://cohen-reilly.biz/  **May offer help with minor housing repairs  [facility] [address], [place], [place] [address] [phone] **emergency housing for victims of domestic violence

## 2021-09-20 ENCOUNTER — Encounter: Payer: Self-pay | Admitting: *Deleted

## 2021-09-20 ENCOUNTER — Telehealth: Payer: Self-pay | Admitting: *Deleted

## 2021-09-20 DIAGNOSIS — B9689 Other specified bacterial agents as the cause of diseases classified elsewhere: Secondary | ICD-10-CM

## 2021-09-20 MED ORDER — METRONIDAZOLE 500 MG PO TABS: 500.0000 mg | ORAL_TABLET | Freq: Two times a day (BID) | ORAL | 0 refills | Status: DC

## 2021-09-20 NOTE — Telephone Encounter (Signed)
Per follow up pt was treated for Gonorrhea at [facility] 09/18/21 but was not treated for BV. I called and spoke with her Mother, and explained Dr. Alysia Penna recommended treating BV, RX instructions given and sent to pharmacy. STD form updated to health department for Gonorrhea. Nancy Fetter

## 2021-09-21 ENCOUNTER — Telehealth: Payer: Self-pay

## 2021-09-21 ENCOUNTER — Ambulatory Visit
Admission: RE | Admit: 2021-09-21 | Discharge: 2021-09-21 | Disposition: A | Discharge status: Home / Self Care | Payer: Medicaid Other | Source: Ambulatory Visit | Attending: Family Medicine | Admitting: Family Medicine

## 2021-09-21 ENCOUNTER — Ambulatory Visit (INDEPENDENT_AMBULATORY_CARE_PROVIDER_SITE_OTHER): Payer: Medicaid Other | Admitting: Family Medicine

## 2021-09-21 VITALS — BP 128/84 | HR 78 | Ht 62.0 in | Wt 231.6 lb

## 2021-09-21 DIAGNOSIS — R030 Elevated blood-pressure reading, without diagnosis of hypertension: Secondary | ICD-10-CM

## 2021-09-21 DIAGNOSIS — I10 Essential (primary) hypertension: Secondary | ICD-10-CM

## 2021-09-21 DIAGNOSIS — J4540 Moderate persistent asthma, uncomplicated: Secondary | ICD-10-CM

## 2021-09-21 DIAGNOSIS — M25572 Pain in left ankle and joints of left foot: Secondary | ICD-10-CM

## 2021-09-21 DIAGNOSIS — R42 Dizziness and giddiness: Secondary | ICD-10-CM

## 2021-09-21 DIAGNOSIS — R0789 Other chest pain: Secondary | ICD-10-CM

## 2021-09-21 DIAGNOSIS — F332 Major depressive disorder, recurrent severe without psychotic features: Secondary | ICD-10-CM

## 2021-09-21 NOTE — Assessment & Plan Note (Signed)
Normal blood pressure on HCTZ 25 mg daily.  Check BMP and CBC today given report of lightheadedness.  Orthostatics negative today in clinic.

## 2021-09-21 NOTE — Patient Instructions (Addendum)
It was great to see you again today!  Go get xrays of your ankle and chest bone today. Depending on result may refer to sports medicine. Checking labs today- kidneys, electrolytes, cholesterol, blood counts  Reach out to psychiatrist about the medication side effects you are noting (dizziness, drowsiness, headache)  Be well, Dr. Pollie Meyer

## 2021-09-21 NOTE — Telephone Encounter (Signed)
Spoke with mom, she had no specific questions for me Latrelle Dodrill, MD

## 2021-09-21 NOTE — Telephone Encounter (Signed)
Pt mom would like Dr. Pollie Meyer to call her to discuss the visit pt had today. Sunday Spillers, CMA

## 2021-09-21 NOTE — Assessment & Plan Note (Signed)
Doing well, followed by psychiatry.  Encouraged her to reach out to psychiatry physician regarding dizziness she has experienced since increasing the Abilify.
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
## 2021-09-21 NOTE — Assessment & Plan Note (Signed)
Well controlled. Continue current medication regimen.  

## 2021-09-22 ENCOUNTER — Encounter: Payer: Self-pay | Admitting: Family Medicine

## 2021-09-22 LAB — LIPID PANEL
Chol/HDL Ratio: 3.5 ratio (ref 0.0–4.4)
Cholesterol, Total: 128 mg/dL (ref 100–199)
HDL: 37 mg/dL — ABNORMAL LOW (ref >39)
LDL Chol Calc (NIH): 72 mg/dL (ref 0–99)
Triglycerides: 100 mg/dL (ref 0–149)
VLDL Cholesterol Cal: 19 mg/dL (ref 5–40)

## 2021-09-22 LAB — BASIC METABOLIC PANEL
BUN/Creatinine Ratio: 11 (ref 9–23)
BUN: 9 mg/dL (ref 6–20)
CO2: 26 mmol/L (ref 20–29)
Calcium: 9.7 mg/dL (ref 8.7–10.2)
Chloride: 101 mmol/L (ref 96–106)
Creatinine, Ser: 0.85 mg/dL (ref 0.57–1.00)
Glucose: 84 mg/dL (ref 70–99)
Potassium: 4.1 mmol/L (ref 3.5–5.2)
Sodium: 140 mmol/L (ref 134–144)
eGFR: 97 mL/min/{1.73_m2} (ref >59)

## 2021-09-22 LAB — CBC
Hematocrit: 36.1 % (ref 34.0–46.6)
Hemoglobin: 11.9 g/dL (ref 11.1–15.9)
MCH: 27.3 pg (ref 26.6–33.0)
MCHC: 33 g/dL (ref 31.5–35.7)
MCV: 83 fL (ref 79–97)
Platelets: 270 10*3/uL (ref 150–450)
RBC: 4.36 x10E6/uL (ref 3.77–5.28)
RDW: 12.8 % (ref 11.7–15.4)
WBC: 6.4 10*3/uL (ref 3.4–10.8)

## 2021-09-25 NOTE — BH Specialist Note (Signed)
Pt did not arrive to video visit and did not answer the phone; Left HIPPA-compliant message to call back Kealy Lewter from [facility] at  [phone] (Najib Colmenares's office).  ?; left MyChart message for patient.  ? ?

## 2021-09-29 ENCOUNTER — Ambulatory Visit (INDEPENDENT_AMBULATORY_CARE_PROVIDER_SITE_OTHER): Payer: Medicaid Other | Admitting: Student

## 2021-09-29 DIAGNOSIS — Z7251 High risk heterosexual behavior: Secondary | ICD-10-CM

## 2021-09-29 DIAGNOSIS — Z113 Encounter for screening for infections with a predominantly sexual mode of transmission: Secondary | ICD-10-CM

## 2021-09-29 DIAGNOSIS — F332 Major depressive disorder, recurrent severe without psychotic features: Secondary | ICD-10-CM
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
## 2021-09-29 NOTE — Patient Instructions (Signed)
It was great seeing you today.  We will plan to see you again in * months,  We collected labs today. I will call you if they are abnormal.   If you have any questions or concerns, please feel free to call the clinic.    Be well,  Dr. Darral Dash [facility] [phone]

## 2021-10-02 NOTE — Progress Notes (Signed)
Error

## 2021-10-03 ENCOUNTER — Ambulatory Visit: Payer: Medicaid Other | Admitting: Clinical

## 2021-10-03 DIAGNOSIS — Z91199 Patient's noncompliance with other medical treatment and regimen due to unspecified reason: Secondary | ICD-10-CM

## 2021-10-06 ENCOUNTER — Encounter: Payer: Self-pay | Admitting: Family Medicine

## 2021-10-06 ENCOUNTER — Telehealth: Payer: Self-pay | Admitting: Family Medicine

## 2021-10-06 ENCOUNTER — Ambulatory Visit (INDEPENDENT_AMBULATORY_CARE_PROVIDER_SITE_OTHER): Payer: Medicaid Other | Admitting: Family Medicine

## 2021-10-06 ENCOUNTER — Other Ambulatory Visit (HOSPITAL_COMMUNITY)
Admission: RE | Admit: 2021-10-06 | Discharge: 2021-10-06 | Disposition: A | Discharge status: Home / Self Care | Payer: Medicaid Other | Source: Ambulatory Visit | Attending: Family Medicine | Admitting: Family Medicine

## 2021-10-06 VITALS — BP 124/80 | HR 91 | Temp 97.9°F | Ht 62.0 in | Wt 234.6 lb

## 2021-10-06 DIAGNOSIS — Z113 Encounter for screening for infections with a predominantly sexual mode of transmission: Secondary | ICD-10-CM | POA: Diagnosis not present

## 2021-10-06 LAB — POCT URINE PREGNANCY: Preg Test, Ur: NEGATIVE

## 2021-10-06 NOTE — Telephone Encounter (Signed)
Pt ask that she be called with the results of her pregnancy test.  Ph # [phone]

## 2021-10-06 NOTE — Progress Notes (Unsigned)
    SUBJECTIVE:   CHIEF COMPLAINT / HPI:   Vaginal Discharge: Patient is a 26 y.o. female   She is interested in screening for sexually transmitted infections today.  En  Concerned that her periods have been on and off   PERTINENT  PMH / PSH: ***None relevant  OBJECTIVE:   LMP 09/05/2021 (Approximate)    General: NAD, pleasant, able to participate in exam Respiratory: Normal effort, no obvious respiratory distress Pelvic: VULVA: normal appearing vulva with no masses, tenderness or lesions, VAGINA: Normal appearing vagina with normal color, no lesions, with {GYN VAGINAL DISCHARGE:21986} discharge present, ***CERVIX: No lesions, {GYN VAGINAL DISCHARGE:21986} discharge present,  Chaperone *** present for pelvic exam  ASSESSMENT/PLAN:   No problem-specific Assessment & Plan notes found for this encounter.    Assessment:  26 y.o. female with vaginal discharge for***days, as well as***.  Physical exam significant for*** discharge.  Wet prep performed today shows *** consistent with ***.  Patient is interested in STI screening.   Plan: -Wet prep as above.  Will treat with***. -GC/chlamydia pending -Will check HIV and [place]

## 2021-10-06 NOTE — Telephone Encounter (Signed)
Attempted to call patient .  There was no answer and the voice mail box had not been set up.  Urine pregnancy was negative.  If patient should call back, please inform her.  Lisa KitchenOzella Almond, [name]

## 2021-10-06 NOTE — Telephone Encounter (Signed)
Received a call on the after hours line requesting call back to Lisa [name]. Attempted to call the patient 3 times, but phone rang once and then went to voicemail box that was not set-up. Unable to leave message or get in contact with patient.    Noely Kuhnle, DO

## 2021-10-06 NOTE — Patient Instructions (Signed)
It was great seeing you today!  Today we checked for STDs.  If anything is abnormal I will call you and send in treatment, but if normal I will send a MyChart message.  Feel free to call with any questions or concerns at any time, at [phone].   Take care,  Dr. Shary Key [facility]

## 2021-10-09 ENCOUNTER — Telehealth: Payer: Self-pay

## 2021-10-09 LAB — CERVICOVAGINAL ANCILLARY ONLY
Bacterial Vaginitis (gardnerella): POSITIVE — AB
Candida Glabrata: NEGATIVE
Candida Vaginitis: NEGATIVE
Chlamydia: POSITIVE — AB
Comment: NEGATIVE
Comment: NEGATIVE
Comment: NEGATIVE
Comment: NEGATIVE
Comment: NEGATIVE
Comment: NORMAL
Neisseria Gonorrhea: NEGATIVE
Trichomonas: NEGATIVE

## 2021-10-09 NOTE — Telephone Encounter (Signed)
Patient calls nurse line requesting STD results from 9/29.  Patient advised of negative urine pregnancy test.  Patient advised of positive Chlamydia and Bacterial Vaginosis.  Will forward to provider who saw patient.

## 2021-10-10 ENCOUNTER — Other Ambulatory Visit: Payer: Self-pay | Admitting: Family Medicine

## 2021-10-10 DIAGNOSIS — N760 Acute vaginitis: Secondary | ICD-10-CM

## 2021-10-10 MED ORDER — DOXYCYCLINE HYCLATE 100 MG PO TABS: 100.0000 mg | ORAL_TABLET | Freq: Two times a day (BID) | ORAL | 0 refills | Status: AC

## 2021-10-10 MED ORDER — METRONIDAZOLE 500 MG PO TABS: 500.0000 mg | ORAL_TABLET | Freq: Two times a day (BID) | ORAL | 0 refills | Status: DC

## 2021-10-10 NOTE — Telephone Encounter (Signed)
Patient sent message via mychart wanting to schedule for treatment. There are no orders for treatment yet. I will let patient know that we are waiting to receive these and will contact when we are able to schedule.   Please call patient to schedule or send message to admin pool asking to contact patient.

## 2021-10-17 ENCOUNTER — Ambulatory Visit: Payer: Self-pay | Admitting: Student

## 2021-10-17 ENCOUNTER — Telehealth: Payer: Self-pay

## 2021-10-17 NOTE — Telephone Encounter (Signed)
Patient calls nurse line requesting letter for work excusing absence today and for next Tuesday, 10/17.  Patient states that she previously took off work today to come to scheduled appointment at 1:30, however, had a family emergency and was not able to keep appointment.   She states that she also needs documentation that she needs to have 10/17 off to keep scheduled appointment with Dr. Ardelia Mems.   Please advise.   Talbot Grumbling, RN

## 2021-10-18 ENCOUNTER — Other Ambulatory Visit: Payer: Self-pay | Admitting: Family Medicine

## 2021-10-18 NOTE — Progress Notes (Signed)
Letter for work to attend appt next week

## 2021-10-18 NOTE — Telephone Encounter (Signed)
Letter written for 10/17 to miss work to come to appt with Dr Ardelia Mems. Sent mychart message to let her know we cannot write her a note for a day she was not seen here.  Yehuda Savannah MD
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
## 2021-10-19 ENCOUNTER — Encounter (HOSPITAL_COMMUNITY): Payer: Medicaid Other | Admitting: Psychiatry

## 2021-10-24 ENCOUNTER — Ambulatory Visit: Payer: Medicaid Other | Admitting: Family Medicine

## 2021-11-07 ENCOUNTER — Inpatient Hospital Stay (HOSPITAL_COMMUNITY): Admit: 2021-11-07 | Payer: Self-pay

## 2021-12-07 ENCOUNTER — Ambulatory Visit: Payer: Medicaid Other | Admitting: Family Medicine

## 2021-12-19 ENCOUNTER — Other Ambulatory Visit (HOSPITAL_COMMUNITY)
Admission: RE | Admit: 2021-12-19 | Discharge: 2021-12-19 | Disposition: A | Discharge status: Home / Self Care | Payer: Medicaid Other | Source: Ambulatory Visit | Attending: Family Medicine | Admitting: Family Medicine

## 2021-12-19 ENCOUNTER — Ambulatory Visit (INDEPENDENT_AMBULATORY_CARE_PROVIDER_SITE_OTHER): Payer: Medicaid Other | Admitting: Student

## 2021-12-19 VITALS — BP 123/80 | HR 85 | Ht 62.0 in | Wt 237.0 lb

## 2021-12-19 DIAGNOSIS — R109 Unspecified abdominal pain: Secondary | ICD-10-CM | POA: Insufficient documentation

## 2021-12-19 DIAGNOSIS — Z7251 High risk heterosexual behavior: Secondary | ICD-10-CM

## 2021-12-19 LAB — POCT WET PREP (WET MOUNT)
Clue Cells Wet Prep Whiff POC: POSITIVE
Trichomonas Wet Prep HPF POC: ABSENT

## 2021-12-19 NOTE — Patient Instructions (Addendum)
It was great to see you today! Thank you for choosing [facility] for your primary care. Lisa [name] was seen for follow up.  I will call with your lab results  Please return tomorrow for a urine sample   OB provider to call [phone] [place] [phone]   If you haven't already, sign up for My Chart to have easy access to your labs results, and communication with your primary care physician.  I recommend that you always bring your medications to each appointment as this makes it easy to ensure you are on the correct medications and helps [place] not miss refills when you need them. Call the clinic at [phone] if your symptoms worsen or you have any concerns.  You should return to our clinic Return if symptoms worsen or fail to improve. Please arrive 15 minutes before your appointment to ensure smooth check in process.  We appreciate your efforts in making this happen.  Thank you for allowing me to participate in your care, Alfredo Martinez, MD 12/19/2021, 3:18 PM PGY-2, [facility]

## 2021-12-19 NOTE — Progress Notes (Unsigned)
    SUBJECTIVE:   CHIEF COMPLAINT / HPI:   STI check - recently became sexually active with a new partner, wants to be checked for STIs. Previously tested for ***.  - preferred gender of partner: *** - Medications tried: *** - Sexually active with *** *** partner(s) - Last sexual encounter: *** - Contraception: *** Symptoms include: {STISXs:28021}    PERTINENT  PMH / PSH: ***  OBJECTIVE:   There were no vitals taken for this visit.  ***  ASSESSMENT/PLAN:   No problem-specific Assessment & Plan notes found for this encounter.     Alfredo Martinez, MD [facility]

## 2021-12-20 ENCOUNTER — Encounter: Payer: Self-pay | Admitting: Student

## 2021-12-20 LAB — CERVICOVAGINAL ANCILLARY ONLY
Chlamydia: POSITIVE — AB
Comment: NEGATIVE
Comment: NEGATIVE
Comment: NORMAL
Neisseria Gonorrhea: POSITIVE — AB
Trichomonas: NEGATIVE

## 2021-12-20 LAB — HIV ANTIBODY (ROUTINE TESTING W REFLEX): HIV Screen 4th Generation wRfx: NONREACTIVE

## 2021-12-20 LAB — RPR: RPR Ser Ql: NONREACTIVE

## 2021-12-20 NOTE — Assessment & Plan Note (Signed)
Recent flank pain in past that seemed MSK in origin.  Also appears this way today.  Patient unable to provide UA today, asked to return for urine and future order placed.  Needs Reflex to cx  Await STI results  Return precautions given

## 2021-12-20 NOTE — Assessment & Plan Note (Signed)
Reviewed labs and allergies, will check GC/Chlamydia, Trichomonas, RPR, and HIV as well as wet prep and will call patient with results. Discussed safe sex practices. Patient's questions answered to their satisfaction.

## 2021-12-21 ENCOUNTER — Telehealth: Payer: Self-pay

## 2021-12-21 ENCOUNTER — Ambulatory Visit (INDEPENDENT_AMBULATORY_CARE_PROVIDER_SITE_OTHER): Payer: Medicaid Other

## 2021-12-21 ENCOUNTER — Other Ambulatory Visit: Payer: Self-pay | Admitting: Student

## 2021-12-21 DIAGNOSIS — A549 Gonococcal infection, unspecified: Secondary | ICD-10-CM

## 2021-12-21 DIAGNOSIS — A749 Chlamydial infection, unspecified: Secondary | ICD-10-CM

## 2021-12-21 MED ORDER — CEFTRIAXONE SODIUM 250 MG IJ SOLR
250.0000 mg | Freq: Once | INTRAMUSCULAR | Status: AC
  Administered 2021-12-21: 250 mg via INTRAMUSCULAR

## 2021-12-21 MED ORDER — CEFTRIAXONE SODIUM 500 MG IJ SOLR: 500.0000 mg | Freq: Once | INTRAMUSCULAR | Status: DC

## 2021-12-21 MED ORDER — DOXYCYCLINE HYCLATE 50 MG PO CAPS: 50.0000 mg | ORAL_CAPSULE | Freq: Two times a day (BID) | ORAL | 0 refills | Status: AC

## 2021-12-21 NOTE — Telephone Encounter (Signed)
Patient calls nurse line requesting results.   Patient advised she tested positive for gonorrhea and chlamydia.   Please place orders for treatment.   She was advised of negative HIV and RPR and positive BV. Advised of medication to the pharmacy to treat.  Will forward to provider who saw patient.

## 2021-12-21 NOTE — Progress Notes (Signed)
Previously received Rocephin 09/2021 without reaction

## 2021-12-21 NOTE — Progress Notes (Signed)
Patient in nurse clinic today for STD treatment of Gonorrhea.  Patient advised to abstain from sex for 7-10 days after treatment of self and partner.    Ceftriaxone 250 mg IM x 1 given in LUOQ  and Ceftriaxone 250 mg IM x 1 given in RUOQ per Dr. Luvenia Starch orders.  Patient observed 15 minutes in office.  No reaction noted.   Patient to follow up in 2-3 months for re-screening.    Provided condoms and advised to use with all sexual activity. Patient verbalized understanding.   STD report form fax completed and faxed to [facility] Department at [phone] (STD department).

## 2021-12-26 ENCOUNTER — Telehealth: Payer: Self-pay

## 2021-12-26 NOTE — Telephone Encounter (Signed)
Patient calls nurse line requesting to speak with Dr. Pollie Meyer regarding a personal issue.   She is also asking about the status of paperwork for her to donate plasma. She states that her mother dropped this off last week. I am unable to find this documented in her chart or find in team folder or provider box.   Forwarding to PCP.   Please return call to patient at [phone].  Veronda Prude, RN

## 2021-12-27 NOTE — Telephone Encounter (Signed)
Returned call to patient. She had questions about having her salpingectomy reversed as she is interested in having more children in the future. Currently has a partner who says he wants kids, so that led her to ask about it.  Explained to her that while I am not a surgeon, it seems it would be very challenging to reverse the procedure considering her tubes were completely removed. Offered that she can speak with OB/GYN if she is interested in reviewing her options.  Gently discussed with her that she shared repeatedly during her pregnancy that she did not want to become pregnant again, and that salpingectomy is considered permanent. Also discussed how extremely high risk her pregnancy ended up being - requiring C/S during her second trimester in order to save her life.   She asked also about the plasma donation form. Discussed that I would not recommend she donate plasma at present given her multiple recent STIs (4 separate ones since her c/s in July). She would be at much higher risk of having contracted acute HIV prior to plasma donation, and could potentially pass that on to others. Patient understanding and appreciative of the discussion.  Latrelle Dodrill, MD

## 2022-01-10 ENCOUNTER — Encounter: Payer: Self-pay | Admitting: Family Medicine

## 2022-01-14 ENCOUNTER — Encounter (HOSPITAL_COMMUNITY): Payer: Self-pay | Admitting: *Deleted

## 2022-01-14 ENCOUNTER — Emergency Department (HOSPITAL_COMMUNITY)
Admission: EM | Admit: 2022-01-14 | Discharge: 2022-01-14 | Disposition: A | Discharge status: Home / Self Care | Payer: Medicaid Other | Attending: Emergency Medicine | Admitting: Emergency Medicine

## 2022-01-14 ENCOUNTER — Emergency Department (HOSPITAL_COMMUNITY): Payer: Medicaid Other

## 2022-01-14 ENCOUNTER — Other Ambulatory Visit: Payer: Self-pay

## 2022-01-14 DIAGNOSIS — R29810 Facial weakness: Secondary | ICD-10-CM | POA: Insufficient documentation

## 2022-01-14 DIAGNOSIS — R519 Headache, unspecified: Secondary | ICD-10-CM | POA: Diagnosis present

## 2022-01-14 DIAGNOSIS — Z79899 Other long term (current) drug therapy: Secondary | ICD-10-CM | POA: Diagnosis not present

## 2022-01-14 HISTORY — DX: Bipolar disorder, unspecified: F31.9

## 2022-01-14 LAB — BASIC METABOLIC PANEL
Anion gap: 12 (ref 5–15)
BUN: 5 mg/dL — ABNORMAL LOW (ref 6–20)
CO2: 18 mmol/L — ABNORMAL LOW (ref 22–32)
Calcium: 9.4 mg/dL (ref 8.9–10.3)
Chloride: 105 mmol/L (ref 98–111)
Creatinine, Ser: 1.06 mg/dL — ABNORMAL HIGH (ref 0.44–1.00)
GFR, Estimated: >60 mL/min (ref >60)
Glucose, Bld: 97 mg/dL (ref 70–99)
Potassium: 3.8 mmol/L (ref 3.5–5.1)
Sodium: 135 mmol/L (ref 135–145)

## 2022-01-14 LAB — CBC WITH DIFFERENTIAL/PLATELET
Abs Immature Granulocytes: 0.02 10*3/uL (ref 0.00–0.07)
Basophils Absolute: 0 10*3/uL (ref 0.0–0.1)
Basophils Relative: 0 %
Eosinophils Absolute: 0.1 10*3/uL (ref 0.0–0.5)
Eosinophils Relative: 1 %
HCT: 41 % (ref 36.0–46.0)
Hemoglobin: 13.4 g/dL (ref 12.0–15.0)
Immature Granulocytes: 0 %
Lymphocytes Relative: 33 %
Lymphs Abs: 2.7 10*3/uL (ref 0.7–4.0)
MCH: 26.3 pg (ref 26.0–34.0)
MCHC: 32.7 g/dL (ref 30.0–36.0)
MCV: 80.6 fL (ref 80.0–100.0)
Monocytes Absolute: 0.6 10*3/uL (ref 0.1–1.0)
Monocytes Relative: 7 %
Neutro Abs: 4.7 10*3/uL (ref 1.7–7.7)
Neutrophils Relative %: 59 %
Platelets: 318 10*3/uL (ref 150–400)
RBC: 5.09 MIL/uL (ref 3.87–5.11)
RDW: 13.7 % (ref 11.5–15.5)
WBC: 8.1 10*3/uL (ref 4.0–10.5)
nRBC: 0 % (ref 0.0–0.2)

## 2022-01-14 LAB — CBG MONITORING, ED: Glucose-Capillary: 90 mg/dL (ref 70–99)

## 2022-01-14 LAB — I-STAT BETA HCG BLOOD, ED (MC, WL, AP ONLY): I-stat hCG, quantitative: <5 m[IU]/mL (ref <5)

## 2022-01-14 MED ORDER — PROCHLORPERAZINE EDISYLATE 10 MG/2ML IJ SOLN
10.0000 mg | Freq: Once | INTRAMUSCULAR | Status: AC
  Administered 2022-01-14: 10 mg via INTRAVENOUS
  Filled 2022-01-14: qty 2

## 2022-01-14 MED ORDER — DIPHENHYDRAMINE HCL 50 MG/ML IJ SOLN
25.0000 mg | Freq: Once | INTRAMUSCULAR | Status: AC
  Administered 2022-01-14: 25 mg via INTRAVENOUS
  Filled 2022-01-14: qty 1

## 2022-01-14 NOTE — ED Provider Notes (Signed)
[facility] EMERGENCY DEPARTMENT Provider Note   CSN: [id number] Arrival date & time: 01/14/22  1326     History  Chief Complaint  Patient presents with   Headache    Lisa [name] is a 27 y.o. female.  27 yo F with a cc of R sided facial droop and L sided headache.  Felt bad yesterday, had trouble getting her out of the car last night.  Seemed better this morning.  Mom noticed something wrong with her face with talking.  Brought here for eval.  Hx of seizures, no reported seizure activity. Has had problems with her blood pressure since she was pregnant this past year.    Headache      Home Medications Prior to Admission medications   Medication Sig Start Date End Date Taking? Authorizing Provider  albuterol (PROVENTIL) (2.5 MG/3ML) 0.083% nebulizer solution Take 2.5 mg by nebulization every 4 (four) hours as needed for wheezing or shortness of breath.    [provider]  albuterol (VENTOLIN HFA) 108 (90 Base) MCG/ACT inhaler Inhale 2 puffs into the lungs every 4 (four) hours as needed for wheezing or shortness of breath. 07/17/21   Constant, Peggy, MD  ARIPiprazole (ABILIFY) 15 MG tablet Take 1 tablet (15 mg total) by mouth daily. Can take at nighttime if medication leading to drowsiness. 09/15/21   Bahraini, Sarah A  EPINEPHrine 0.3 mg/0.3 mL IJ SOAJ injection Inject 0.3 mg into the muscle once as needed for up to 1 dose. 07/13/21   Latrelle Dodrill, MD  fluticasone (FLONASE) 50 MCG/ACT nasal spray PLACE 1 SPRAY INTO BOTH NOSTRILS DAILY. 09/18/21   Vonna Drafts, MD  fluticasone (FLOVENT HFA) 44 MCG/ACT inhaler Inhale 2 puffs into the lungs in the morning and at bedtime. 07/14/21   Latrelle Dodrill, MD  guanFACINE (INTUNIV) 1 MG TB24 ER tablet Take 1 tablet (1 mg total) by mouth at bedtime. 09/15/21   Bahraini, Sarah A  hydrochlorothiazide (HYDRODIURIL) 12.5 MG tablet Take 2 tablets (25 mg total) by mouth daily. 08/07/21   Hermina Staggers, MD   traZODone (DESYREL) 50 MG tablet Take 1/2 tablet to 1 tablet (25-50 mg total) nightly as needed for sleep. 08/25/21   Bahraini, Sarah A      Allergies    Amoxicillin, Contrast media [iodinated contrast media], Fish allergy, Percocet [oxycodone-acetaminophen], and Atarax [hydroxyzine]    Review of Systems   Review of Systems  Neurological:  Positive for headaches.    Physical Exam Updated Vital Signs BP 110/67   Pulse 80   Temp 98.2 F (36.8 C) (Oral)   Resp 16   Ht 5\' 2"  (1.575 m)   Wt 107.5 kg   LMP 12/24/2021 (Exact Date)   SpO2 100%   BMI 43.35 kg/m  Physical Exam  ED Results / Procedures / Treatments   Labs (all labs ordered are listed, but only abnormal results are displayed) Labs Reviewed  BASIC METABOLIC PANEL - Abnormal; Notable for the following components:      Result Value   CO2 18 (*)    BUN 5 (*)    Creatinine, Ser 1.06 (*)    All other components within normal limits  CBC WITH DIFFERENTIAL/PLATELET  I-STAT BETA HCG BLOOD, ED ([facility], [facility], [facility] ONLY)  CBG MONITORING, ED    EKG EKG Interpretation  Date/Time:  Sunday January 14 2022 13:30:02 EST Ventricular Rate:  84 PR Interval:  158 QRS Duration: 84 QT Interval:  350 QTC Calculation: 413 R  Axis:   44 Text Interpretation: Normal sinus rhythm Cannot rule out Anterior infarct , age undetermined Abnormal ECG downsloping st segments in inferior leads not seen on prior Confirmed by Deno Etienne [phone]) on 01/14/2022 3:56:16 PM  Radiology MR BRAIN WO CONTRAST  Result Date: 01/14/2022 CLINICAL DATA:  Stroke suspected EXAM: MRI HEAD WITHOUT CONTRAST TECHNIQUE: Multiplanar, multiecho pulse sequences of the brain and surrounding structures were obtained without intravenous contrast. COMPARISON:  07/22/2021 MRI head, correlation is also made with CT head 01/14/2022 FINDINGS: Brain: No restricted diffusion to suggest acute or subacute infarct.No acute hemorrhage, mass, mass effect, or midline shift. No hydrocephalus or  extra-axial collection.No hemosiderin deposition to suggest remote hemorrhage.Normal pituitary and craniocervical junction. Vascular: Patent arterial flow voids. Skull and upper cervical spine: Normal marrow signal. Sinuses/Orbits: Clear paranasal sinuses.No acute finding in the orbits. Other: The mastoid air cells are well aerated. IMPRESSION: No acute intracranial process. No evidence of acute or subacute infarct. Electronically Signed   By: Merilyn Baba M.D.   On: 01/14/2022 21:13   CT Head Wo Contrast  Result Date: 01/14/2022 CLINICAL DATA:  Headaches, blurred vision, neurological deficit EXAM: CT HEAD WITHOUT CONTRAST TECHNIQUE: Contiguous axial images were obtained from the base of the skull through the vertex without intravenous contrast. RADIATION DOSE REDUCTION: This exam was performed according to the departmental dose-optimization program which includes automated exposure control, adjustment of the mA and/or kV according to patient size and/or use of iterative reconstruction technique. COMPARISON:  11/22/2020 FINDINGS: Brain: No acute intracranial findings are seen in noncontrast CT brain. There are no signs of bleeding within the cranium. Ventricles are not dilated. There is no focal edema or mass effect. Vascular: Unremarkable. Skull: No acute findings are seen. Sinuses/Orbits: Unremarkable. Other: None. IMPRESSION: No acute intracranial findings are seen in noncontrast CT brain. No significant interval changes are noted since 11/22/2020. Electronically Signed   By: Elmer Picker M.D.   On: 01/14/2022 15:01    Procedures Procedures    Medications Ordered in ED Medications  prochlorperazine (COMPAZINE) injection 10 mg (10 mg Intravenous Given 01/14/22 1630)  diphenhydrAMINE (BENADRYL) injection 25 mg (25 mg Intravenous Given 01/14/22 1630)    ED Course/ Medical Decision Making/ A&P                           Medical Decision Making Amount and/or Complexity of Data  Reviewed Radiology: ordered.  Risk Prescription drug management.   27 yo F with a cc of right facial droop.  This was noticed by mom last night.  She has a right-sided leg weakness for me.  I had the nurse go in after me and do a neuroexam and she did not appreciate any right leg weakness.  She had had a recent visit where she had a very extensive workup and it was thought that her symptoms were psychogenic in nature.  I discussed the case with Dr. Maurine Minister, neurology.  Recommends MRI if negative and felt safe for discharge with outpatient follow-up.  MRI is negative for acute stroke.  I discussed results with the patient to interestingly he is now completely different awake and alert and conversant.  Makes me concerned that perhaps she was acting 1 way in front of her mother.  Will have her follow-up with neurology in the office.  9:35 PM:  I have discussed the diagnosis/risks/treatment options with the patient.  Evaluation and diagnostic testing in the emergency department does not suggest an emergent  condition requiring admission or immediate intervention beyond what has been performed at this time.  They will follow up with Neuro. We also discussed returning to the ED immediately if new or worsening sx occur. We discussed the sx which are most concerning (e.g., sudden worsening pain, fever, inability to tolerate by mouth) that necessitate immediate return. Medications administered to the patient during their visit and any new prescriptions provided to the patient are listed below.  Medications given during this visit Medications  prochlorperazine (COMPAZINE) injection 10 mg (10 mg Intravenous Given 01/14/22 1630)  diphenhydrAMINE (BENADRYL) injection 25 mg (25 mg Intravenous Given 01/14/22 1630)     The patient appears reasonably screen and/or stabilized for discharge and I doubt any other medical condition or other [facility] requiring further screening, evaluation, or treatment in the ED at this time  prior to discharge.          Final Clinical Impression(s) / ED Diagnoses Final diagnoses:  Bad headache    Rx / DC Orders ED Discharge Orders          Ordered    Ambulatory referral to Neurology       Comments: Complicated migraine?   01/14/22 2118              Melene Plan, DO 01/14/22 2135

## 2022-01-14 NOTE — ED Notes (Signed)
Pt with 2 episodes of shaking/seizure-like activity in triage lasting approx 5 sec.  Pt was not post-ictal after episodes.

## 2022-01-14 NOTE — ED Triage Notes (Addendum)
Pt here from hotel with EMS for headache for several days.  Denies nausea or fevers, but has had non-productive cough.  Pt has been to see her pcp for blurred vision and headaches and was removed by hypertensive meds x 1 month.  Mother thought she may be having a stroke b/c her "face was twisted up".  However, when EMS arrived, no neuro deficits were noted.  Per mother, pt does "weird stuff" when she has panic attacks and she has been having more attacks than normal.  PT states pain to R side forehead and numbness to her entire R side.  Pt states symptoms started 9 pm last night.  Pt had been out at a skating rink and began feeling dizzy when she came home.  Pt with hx of MR and her mother is on her way from the hotel.  However, family situation is complicated.

## 2022-01-14 NOTE — ED Provider Triage Note (Addendum)
Emergency Medicine Provider Triage Evaluation Note  Lisa [name] , a 27 y.o. female  was evaluated in triage.  Pt complains of headache since yesterday mid-afternoon after a skating.  Does not believe she hit her head, though unsure because she woke up with some tenderness and a knot above her right eyebrow, described as tender.  Denies vision changes, recent URI, fevers, chills, difficulty swallowing, or difficulty speaking.  Was told yesterday before she went ice-skating that her face "looked crooked".  Complaining of right-sided sensation changes.  Per mother, patient's face "does weird stuff" when she has panic attacks, patient has been having more than usual.  Patient says headaches are not normal for her.  LKW over 24 hours ago.  Hx of pseudoseizures, mental retardation.  Review of Systems  Positive:  Negative: See above  Physical Exam  BP (!) 130/91   Pulse 84   Temp 98.6 F (37 C) (Oral)   Resp 16   Ht 5\' 2"  (1.575 m)   Wt 107.5 kg   LMP 12/24/2021 (Exact Date)   SpO2 100%   BMI 43.35 kg/m  Gen:   Awake, no distress   Resp:  Normal effort  MSK:   Moves extremities without difficulty  Other:  EOMI to providers face.  Gaze aligned appropriately.  Mild left-sided mouth droop.  No dysphagia or dysarthria.  Coordination, strength, and sensation appears grossly intact bilaterally.  Negative pronator drift or truncal deviation.  PERRLA.  Medical Decision Making  Medically screening exam initiated at 1:48 PM.  Appropriate orders placed.  Lisa [name] was informed that the remainder of the evaluation will be completed by another provider, this initial triage assessment does not replace that evaluation, and the importance of remaining in the ED until their evaluation is complete.  1 episode of witnessed seizure-like activity lasting 20-30 seconds.  Eyelids squinted to resistance, blink to threat.  Patient pulled arm away during arm drop test.  Does not appear postictal.  ABCs  intact.  Sitting in chair for duration.  No evidence of head trauma or extremity trauma.    Blood glucose POC 90   [place], [place] 35/70/17 1405

## 2022-01-14 NOTE — Discharge Instructions (Addendum)
Luckily your MRI did not show a stroke.  Please follow up with your PCP in the office.

## 2022-01-28 ENCOUNTER — Emergency Department (HOSPITAL_COMMUNITY): Payer: No Typology Code available for payment source

## 2022-01-28 ENCOUNTER — Other Ambulatory Visit: Payer: Self-pay

## 2022-01-28 ENCOUNTER — Emergency Department (HOSPITAL_COMMUNITY)
Admission: EM | Admit: 2022-01-28 | Discharge: 2022-01-28 | Disposition: A | Discharge status: Home / Self Care | Payer: No Typology Code available for payment source | Attending: Emergency Medicine | Admitting: Emergency Medicine

## 2022-01-28 DIAGNOSIS — Y92410 Unspecified street and highway as the place of occurrence of the external cause: Secondary | ICD-10-CM | POA: Insufficient documentation

## 2022-01-28 DIAGNOSIS — D649 Anemia, unspecified: Secondary | ICD-10-CM | POA: Insufficient documentation

## 2022-01-28 DIAGNOSIS — S8991XA Unspecified injury of right lower leg, initial encounter: Secondary | ICD-10-CM | POA: Diagnosis present

## 2022-01-28 DIAGNOSIS — S80211A Abrasion, right knee, initial encounter: Secondary | ICD-10-CM | POA: Insufficient documentation

## 2022-01-28 DIAGNOSIS — R0789 Other chest pain: Secondary | ICD-10-CM | POA: Diagnosis not present

## 2022-01-28 DIAGNOSIS — R109 Unspecified abdominal pain: Secondary | ICD-10-CM | POA: Insufficient documentation

## 2022-01-28 DIAGNOSIS — S0990XA Unspecified injury of head, initial encounter: Secondary | ICD-10-CM | POA: Diagnosis not present

## 2022-01-28 DIAGNOSIS — Z79899 Other long term (current) drug therapy: Secondary | ICD-10-CM | POA: Diagnosis not present

## 2022-01-28 LAB — COMPREHENSIVE METABOLIC PANEL
ALT: 12 U/L (ref 0–44)
AST: 18 U/L (ref 15–41)
Albumin: 3.4 g/dL — ABNORMAL LOW (ref 3.5–5.0)
Alkaline Phosphatase: 75 U/L (ref 38–126)
Anion gap: 9 (ref 5–15)
BUN: 9 mg/dL (ref 6–20)
CO2: 24 mmol/L (ref 22–32)
Calcium: 9.1 mg/dL (ref 8.9–10.3)
Chloride: 105 mmol/L (ref 98–111)
Creatinine, Ser: 0.88 mg/dL (ref 0.44–1.00)
GFR, Estimated: >60 mL/min (ref >60)
Glucose, Bld: 87 mg/dL (ref 70–99)
Potassium: 4 mmol/L (ref 3.5–5.1)
Sodium: 138 mmol/L (ref 135–145)
Total Bilirubin: 0.4 mg/dL (ref 0.3–1.2)
Total Protein: 6.9 g/dL (ref 6.5–8.1)

## 2022-01-28 LAB — CBC
HCT: 34.9 % — ABNORMAL LOW (ref 36.0–46.0)
Hemoglobin: 11.4 g/dL — ABNORMAL LOW (ref 12.0–15.0)
MCH: 26 pg (ref 26.0–34.0)
MCHC: 32.7 g/dL (ref 30.0–36.0)
MCV: 79.7 fL — ABNORMAL LOW (ref 80.0–100.0)
Platelets: 259 10*3/uL (ref 150–400)
RBC: 4.38 MIL/uL (ref 3.87–5.11)
RDW: 13.6 % (ref 11.5–15.5)
WBC: 6.2 10*3/uL (ref 4.0–10.5)
nRBC: 0 % (ref 0.0–0.2)

## 2022-01-28 LAB — I-STAT BETA HCG BLOOD, ED (MC, WL, AP ONLY): I-stat hCG, quantitative: <5 m[IU]/mL (ref <5)

## 2022-01-28 LAB — PROTIME-INR
INR: 1 (ref 0.8–1.2)
Prothrombin Time: 13 seconds (ref 11.4–15.2)

## 2022-01-28 LAB — LACTIC ACID, PLASMA: Lactic Acid, Venous: 1.5 mmol/L (ref 0.5–1.9)

## 2022-01-28 LAB — ETHANOL: Alcohol, Ethyl (B): <10 mg/dL (ref <10)

## 2022-01-28 MED ORDER — IBUPROFEN 800 MG PO TABS
800.0000 mg | ORAL_TABLET | Freq: Once | ORAL | Status: AC
  Administered 2022-01-28: 800 mg via ORAL
  Filled 2022-01-28: qty 1
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
## 2022-01-28 NOTE — Discharge Instructions (Addendum)
Take NSAIDs or Tylenol as needed for the next week. Take this medicine with food. Use a heating pad for sore muscles - use for 20 minutes several times a day Try gentle range of motion exercises Return for worsening symptoms Follow-up with primary care doctor if you continue to have symptoms
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
## 2022-02-07 ENCOUNTER — Emergency Department (HOSPITAL_COMMUNITY): Payer: Medicaid Other

## 2022-02-07 ENCOUNTER — Encounter (HOSPITAL_COMMUNITY): Payer: Self-pay

## 2022-02-07 ENCOUNTER — Emergency Department (HOSPITAL_COMMUNITY)
Admission: EM | Admit: 2022-02-07 | Discharge: 2022-02-07 | Disposition: A | Discharge status: Home / Self Care | Payer: Medicaid Other | Attending: Student | Admitting: Student

## 2022-02-07 ENCOUNTER — Other Ambulatory Visit: Payer: Self-pay

## 2022-02-07 DIAGNOSIS — S62652A Nondisplaced fracture of medial phalanx of right middle finger, initial encounter for closed fracture: Secondary | ICD-10-CM

## 2022-02-07 DIAGNOSIS — Z7951 Long term (current) use of inhaled steroids: Secondary | ICD-10-CM | POA: Insufficient documentation

## 2022-02-07 DIAGNOSIS — W231XXA Caught, crushed, jammed, or pinched between stationary objects, initial encounter: Secondary | ICD-10-CM | POA: Diagnosis not present

## 2022-02-07 DIAGNOSIS — M79644 Pain in right finger(s): Secondary | ICD-10-CM | POA: Diagnosis present

## 2022-02-07 DIAGNOSIS — J45909 Unspecified asthma, uncomplicated: Secondary | ICD-10-CM | POA: Diagnosis not present

## 2022-02-07 DIAGNOSIS — I10 Essential (primary) hypertension: Secondary | ICD-10-CM | POA: Insufficient documentation

## 2022-02-07 MED ORDER — IBUPROFEN 400 MG PO TABS
400.0000 mg | ORAL_TABLET | Freq: Once | ORAL | Status: AC
  Administered 2022-02-07: 400 mg via ORAL
  Filled 2022-02-07: qty 1

## 2022-02-07 NOTE — Discharge Instructions (Addendum)
You were seen in the emergency department today after sustaining an injury to your right middle finger.  There is a small fracture in the middle of your right middle finger.  We have placed a splint over this finger that you should wear over the next 3 weeks.  After that you can remove the splint and move your finger as normal.  Please use ibuprofen or Tylenol for pain relief.  You may also use ice intermittently for about 15 to 20 minutes at a time to help reduce swelling.  Please return to emergency department if you have significantly worsening symptoms.

## 2022-02-07 NOTE — ED Provider Triage Note (Signed)
Emergency Medicine Provider Triage Evaluation Note  Lisa [name] , a 27 y.o. female  was evaluated in triage.  Pt complains of pain to the PIP joint of the right middle finger.  States that she was getting out of bed this morning she placed her hand wrong and bent her middle finger back.  Has had significant pain and a tingling sensation since that time.  Has been able to bend the finger and grasp as normal.  Has not taken anything for the pain at home.  Review of Systems  Positive: As above Negative: As above  Physical Exam  BP 118/73 (BP Location: Right Arm)   Pulse 78   Temp 98.9 F (37.2 C) (Oral)   Resp 15   Ht 5\' 2"  (1.575 m)   Wt 104.3 kg   LMP 12/24/2021 (Exact Date)   SpO2 100%   BMI 42.07 kg/m  Gen:   Awake, no distress   Resp:  Normal effort  MSK:   Moves extremities without difficulty  Other:  Capillary refill of the right middle finger normal.  Sensation intact.  Range of motion normal  Medical Decision Making  Medically screening exam initiated at 2:25 PM.  Appropriate orders placed.  Lisa [name] was informed that the remainder of the evaluation will be completed by another provider, this initial triage assessment does not replace that evaluation, and the importance of remaining in the ED until their evaluation is complete.     Roylene Reason, [place] 02/07/22 1426

## 2022-02-07 NOTE — ED Provider Notes (Signed)
[facility] Provider Note   CSN: [id number] Arrival date & time: 02/07/22  1304     History  Chief Complaint  Patient presents with   Finger Injury    Lisa [name] is a 27 y.o. female. With past medical history of asthma, pseudoseizures, fragile X syndrome, developmental delay, hypertension who presents to the emergency department with finger pain.  States earlier today she was standing up from a table when she states that her right middle finger got caught under the table and bent backward.  She states that she had pain immediately and has had some mild swelling since then.  Denies other injuries.  Denies numbness or tingling to the finger.  HPI     Home Medications Prior to Admission medications   Medication Sig Start Date End Date Taking? Authorizing Provider  albuterol (PROVENTIL) (2.5 MG/3ML) 0.083% nebulizer solution Take 2.5 mg by nebulization every 4 (four) hours as needed for wheezing or shortness of breath.    [provider]  albuterol (VENTOLIN HFA) 108 (90 Base) MCG/ACT inhaler Inhale 2 puffs into the lungs every 4 (four) hours as needed for wheezing or shortness of breath. 07/17/21   Constant, Peggy, MD  ARIPiprazole (ABILIFY) 15 MG tablet Take 1 tablet (15 mg total) by mouth daily. Can take at nighttime if medication leading to drowsiness. 09/15/21   Bahraini, Sarah A  EPINEPHrine 0.3 mg/0.3 mL IJ SOAJ injection Inject 0.3 mg into the muscle once as needed for up to 1 dose. 07/13/21   Leeanne Rio, MD  fluticasone (FLONASE) 50 MCG/ACT nasal spray PLACE 1 SPRAY INTO BOTH NOSTRILS DAILY. 09/18/21   August Albino, MD  fluticasone (FLOVENT HFA) 44 MCG/ACT inhaler Inhale 2 puffs into the lungs in the morning and at bedtime. 07/14/21   Leeanne Rio, MD  guanFACINE (INTUNIV) 1 MG TB24 ER tablet Take 1 tablet (1 mg total) by mouth at bedtime. 09/15/21   Bahraini, Sarah A  hydrochlorothiazide (HYDRODIURIL) 12.5 MG  tablet Take 2 tablets (25 mg total) by mouth daily. 08/07/21   Chancy Milroy, MD  traZODone (DESYREL) 50 MG tablet Take 1/2 tablet to 1 tablet (25-50 mg total) nightly as needed for sleep. 08/25/21   Bahraini, Sarah A      Allergies    Amoxicillin, Contrast media [iodinated contrast media], Fish allergy, Percocet [oxycodone-acetaminophen], and Atarax [hydroxyzine]    Review of Systems   Review of Systems  Musculoskeletal:  Positive for arthralgias and joint swelling.  All other systems reviewed and are negative.   Physical Exam Updated Vital Signs BP 118/73 (BP Location: Right Arm)   Pulse 78   Temp 98.9 F (37.2 C) (Oral)   Resp 15   Ht 5\' 2"  (1.575 m)   Wt 104.3 kg   LMP 12/24/2021 (Exact Date)   SpO2 100%   BMI 42.07 kg/m  Physical Exam Vitals and nursing note reviewed.  HENT:     Head: Normocephalic and atraumatic.  Eyes:     General: No scleral icterus. Pulmonary:     Effort: Pulmonary effort is normal. No respiratory distress.  Musculoskeletal:        General: Swelling and tenderness present. No deformity.     Comments: Right middle finger at PIP has mild swelling and bruising on the volar aspect on the finger. Mild decreased ROM 2/2 pain. Radial pulse 2+. Sensation intact. Able to flex and extend.   Skin:    Findings: No rash.  Neurological:     General: No focal deficit present.     Mental Status: She is alert.  Psychiatric:        Mood and Affect: Mood normal.        Behavior: Behavior normal.        Thought Content: Thought content normal.        Judgment: Judgment normal.     ED Results / Procedures / Treatments   Labs (all labs ordered are listed, but only abnormal results are displayed) Labs Reviewed - No data to display  EKG None  Radiology DG Finger Middle Right  Result Date: 02/07/2022 CLINICAL DATA:  Right middle finger hand pain, primarily involving the PIP joint. EXAM: RIGHT MIDDLE FINGER 2+V COMPARISON:  None Available. FINDINGS:  Linear obliquely oriented lucency involving the palmar aspect of the base of the middle phalanx of the middle digit likely represents a nondisplaced fracture. There is potential extension to involve the adjacent PIP joint without dislocation. No additional fractures are identified. Joint spaces are preserved. Mild soft tissue swelling about the PIP joint. No radiopaque foreign. IMPRESSION: Linear obliquely oriented lucency involving the palmar aspect of the base of the middle phalanx of the middle digit with suspected extension to the adjacent PIP joint likely represents a nondisplaced fracture. Correlation for point tenderness at this location is advised. Electronically Signed   By: Sandi Mariscal M.D.   On: 02/07/2022 15:06    Procedures Procedures   Medications Ordered in ED Medications  ibuprofen (ADVIL) tablet 400 mg (has no administration in time range)    ED Course/ Medical Decision Making/ A&P   {                            Medical Decision Making Risk Prescription drug management.  Initial Impression and Ddx 27 year old female who presents to the emergency department with finger injury. Patient PMH that increases complexity of ED encounter:  asthma, pseudoseizures, fragile X carrier, intellectual disability  Differential: fracture, subluxation, septic joint, gout, pseudogout, arthritis, etc.   Interpretation of Diagnostics I independent reviewed and interpreted the labs as followed: not indicated   - I independently visualized the following imaging with scope of interpretation limited to determining acute life threatening conditions related to emergency care: plain film right middle finger, which revealed nondisplaced fracture at the PIP  Patient Reassessment and Ultimate Disposition/Management Physical exam as noted above. Plain film shows that she does have a nondisplaced fracture at the base of the middle phalanx.  Physical exam is consistent with imaging findings. Given  ibuprofen. Given a static finger splint.  Discussed that she will wear this for 3 weeks for protection and healing and then can remove it and continue to use as normal.  Do not feel that she needs follow-up with orthopedics at this time.  I called and spoke with her legal guardian, Lisa [name], mother and discussed the findings with her.  Discussed need to use the splint for 3 weeks and then can remove it.  She verbalized understanding as well.  She will come back with the patient.  The patient has been appropriately medically screened and/or stabilized in the ED. I have low suspicion for any other emergent medical condition which would require further screening, evaluation or treatment in the ED or require inpatient management. At time of discharge the patient is hemodynamically stable and in no acute distress. I have discussed work-up results and diagnosis with patient and  answered all questions. Patient is agreeable with discharge plan. We discussed strict return precautions for returning to the emergency department and they verbalized understanding.     Patient management required discussion with the following services or consulting groups:  None  Complexity of Problems Addressed Acute complicated illness or Injury  Additional Data Reviewed and Analyzed Further history obtained from: Past medical history and medications listed in the EMR, Recent PCP notes, and Care Everywhere  Patient Encounter Risk Assessment SDOH impact on management  Final Clinical Impression(s) / ED Diagnoses Final diagnoses:  Closed nondisplaced fracture of middle phalanx of right middle finger, initial encounter    Rx / DC Orders ED Discharge Orders     None         Mickie Hillier, PA-C 02/07/22 1639    Kommor, Debe Coder, MD 02/08/22 1250

## 2022-02-07 NOTE — ED Triage Notes (Signed)
Pt arrived POV from home c/o getting up the wrong way and bending her right middle finger back.

## 2022-02-26 ENCOUNTER — Ambulatory Visit: Payer: Medicaid Other | Admitting: Student

## 2022-03-01 ENCOUNTER — Encounter (HOSPITAL_COMMUNITY): Payer: Self-pay

## 2022-03-01 ENCOUNTER — Ambulatory Visit (HOSPITAL_COMMUNITY)
Admission: EM | Admit: 2022-03-01 | Discharge: 2022-03-01 | Disposition: A | Discharge status: Home / Self Care | Payer: Medicaid Other | Attending: Internal Medicine | Admitting: Internal Medicine

## 2022-03-01 DIAGNOSIS — R739 Hyperglycemia, unspecified: Secondary | ICD-10-CM | POA: Diagnosis not present

## 2022-03-01 DIAGNOSIS — Z3202 Encounter for pregnancy test, result negative: Secondary | ICD-10-CM | POA: Diagnosis not present

## 2022-03-01 DIAGNOSIS — Z131 Encounter for screening for diabetes mellitus: Secondary | ICD-10-CM

## 2022-03-01 LAB — POC URINE PREG, ED: Preg Test, Ur: NEGATIVE

## 2022-03-01 LAB — HCG, SERUM, QUALITATIVE: Preg, Serum: NEGATIVE

## 2022-03-01 LAB — CBG MONITORING, ED: Glucose-Capillary: 81 mg/dL (ref 70–99)
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
## 2022-03-01 NOTE — ED Triage Notes (Signed)
Pt states last [facility] 1/17, had a positive home pregnancy test. Pt c/o upper abdominal pain, itchy breast, and upper back pain intermittent x 2-3 days. States EMS came out 2 days ago because of dizziness, headaches, and sleeping a lot. States her CBG 500.

## 2022-03-01 NOTE — Discharge Instructions (Addendum)
Pregnancy test in the clinic is negative. I have drawn your blood today to test for pregnancy.  I will call you if it is positive.    If you develop any new or worsening symptoms or do not improve in the next 2 to 3 days, please return.  If your symptoms are severe, please go to the emergency room.  Follow-up with your primary care provider for further evaluation and management of your symptoms as well as ongoing wellness visits.  I hope you feel better!

## 2022-03-05 ENCOUNTER — Encounter: Payer: Self-pay | Admitting: Family Medicine

## 2022-03-05 ENCOUNTER — Ambulatory Visit (INDEPENDENT_AMBULATORY_CARE_PROVIDER_SITE_OTHER): Payer: Medicaid Other | Admitting: Family Medicine

## 2022-03-05 VITALS — BP 123/75 | HR 76 | Ht 62.0 in | Wt 238.4 lb

## 2022-03-05 DIAGNOSIS — Z9109 Other allergy status, other than to drugs and biological substances: Secondary | ICD-10-CM

## 2022-03-05 DIAGNOSIS — T7840XD Allergy, unspecified, subsequent encounter: Secondary | ICD-10-CM | POA: Diagnosis not present

## 2022-03-05 DIAGNOSIS — S0990XA Unspecified injury of head, initial encounter: Secondary | ICD-10-CM

## 2022-03-05 DIAGNOSIS — J4540 Moderate persistent asthma, uncomplicated: Secondary | ICD-10-CM | POA: Diagnosis not present

## 2022-03-05 DIAGNOSIS — S62602D Fracture of unspecified phalanx of right middle finger, subsequent encounter for fracture with routine healing: Secondary | ICD-10-CM | POA: Diagnosis not present

## 2022-03-05 MED ORDER — CYCLOBENZAPRINE HCL 5 MG PO TABS: 5.0000 mg | ORAL_TABLET | Freq: Two times a day (BID) | ORAL | 0 refills | Status: DC | PRN

## 2022-03-05 MED ORDER — EPINEPHRINE 0.3 MG/0.3ML IJ SOAJ: 0.3000 mg | Freq: Once | INTRAMUSCULAR | 1 refills | Status: DC | PRN

## 2022-03-05 MED ORDER — FLUTICASONE PROPIONATE HFA 44 MCG/ACT IN AERO: 2.0000 | INHALATION_SPRAY | Freq: Two times a day (BID) | RESPIRATORY_TRACT | 3 refills | Status: DC

## 2022-03-05 MED ORDER — FLUTICASONE PROPIONATE 50 MCG/ACT NA SUSP: NASAL | 2 refills | Status: DC

## 2022-03-05 NOTE — Patient Instructions (Addendum)
It was great to see you again today.  Refilled your : epipen, flonase, flovent  Checking CT scan of your head Referring to hand specialist, also giving you a new splint today  Sent in muscle relaxer for you  Will get social worker to reach out to you  Be well, Dr. Ardelia Mems
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
## 2022-03-06 ENCOUNTER — Ambulatory Visit: Payer: Medicaid Other | Admitting: Neurology

## 2022-03-06 ENCOUNTER — Telehealth: Payer: Self-pay | Admitting: Neurology

## 2022-03-06 NOTE — Telephone Encounter (Signed)
Pt's mother cancelled appt due to the household has the flu.

## 2022-03-07 NOTE — Assessment & Plan Note (Signed)
Refill Flovent, also Flonase at patient's request.  Check in later at future visit to see if asthma control is better once we have restarted Flovent.  Also refilled EpiPen so she has it on hand.

## 2022-03-09 ENCOUNTER — Telehealth: Payer: Self-pay

## 2022-03-09 NOTE — Telephone Encounter (Signed)
Informed patient's mother / Legal guardian about upcoming CT.    CT Head w/o contrast 03/12/2022 [facility] @ 1300 and arrival @ 1245  She had already viewed it on [facility].  [name], [place]

## 2022-03-10 ENCOUNTER — Emergency Department (HOSPITAL_COMMUNITY): Payer: Medicaid Other

## 2022-03-10 ENCOUNTER — Emergency Department (HOSPITAL_COMMUNITY)
Admission: EM | Admit: 2022-03-10 | Discharge: 2022-03-10 | Disposition: A | Discharge status: Home / Self Care | Payer: Medicaid Other | Attending: Emergency Medicine | Admitting: Emergency Medicine

## 2022-03-10 ENCOUNTER — Other Ambulatory Visit: Payer: Self-pay

## 2022-03-10 DIAGNOSIS — R519 Headache, unspecified: Secondary | ICD-10-CM | POA: Diagnosis not present

## 2022-03-10 LAB — COMPREHENSIVE METABOLIC PANEL
ALT: 13 U/L (ref 0–44)
AST: 13 U/L — ABNORMAL LOW (ref 15–41)
Albumin: 3.4 g/dL — ABNORMAL LOW (ref 3.5–5.0)
Alkaline Phosphatase: 79 U/L (ref 38–126)
Anion gap: 5 (ref 5–15)
BUN: 8 mg/dL (ref 6–20)
CO2: 25 mmol/L (ref 22–32)
Calcium: 8.4 mg/dL — ABNORMAL LOW (ref 8.9–10.3)
Chloride: 108 mmol/L (ref 98–111)
Creatinine, Ser: 0.97 mg/dL (ref 0.44–1.00)
GFR, Estimated: >60 mL/min (ref >60)
Glucose, Bld: 80 mg/dL (ref 70–99)
Potassium: 3.6 mmol/L (ref 3.5–5.1)
Sodium: 138 mmol/L (ref 135–145)
Total Bilirubin: 0.2 mg/dL — ABNORMAL LOW (ref 0.3–1.2)
Total Protein: 7.2 g/dL (ref 6.5–8.1)

## 2022-03-10 LAB — CBC WITH DIFFERENTIAL/PLATELET
Abs Immature Granulocytes: 0.01 10*3/uL (ref 0.00–0.07)
Basophils Absolute: 0 10*3/uL (ref 0.0–0.1)
Basophils Relative: 0 %
Eosinophils Absolute: 0.1 10*3/uL (ref 0.0–0.5)
Eosinophils Relative: 1 %
HCT: 35.3 % — ABNORMAL LOW (ref 36.0–46.0)
Hemoglobin: 11.3 g/dL — ABNORMAL LOW (ref 12.0–15.0)
Immature Granulocytes: 0 %
Lymphocytes Relative: 43 %
Lymphs Abs: 2.1 10*3/uL (ref 0.7–4.0)
MCH: 26.1 pg (ref 26.0–34.0)
MCHC: 32 g/dL (ref 30.0–36.0)
MCV: 81.5 fL (ref 80.0–100.0)
Monocytes Absolute: 0.4 10*3/uL (ref 0.1–1.0)
Monocytes Relative: 9 %
Neutro Abs: 2.3 10*3/uL (ref 1.7–7.7)
Neutrophils Relative %: 47 %
Platelets: 267 10*3/uL (ref 150–400)
RBC: 4.33 MIL/uL (ref 3.87–5.11)
RDW: 13.9 % (ref 11.5–15.5)
WBC: 4.9 10*3/uL (ref 4.0–10.5)
nRBC: 0 % (ref 0.0–0.2)

## 2022-03-10 LAB — I-STAT BETA HCG BLOOD, ED (MC, WL, AP ONLY): I-stat hCG, quantitative: <5 m[IU]/mL (ref <5)

## 2022-03-10 MED ORDER — DIPHENHYDRAMINE HCL 50 MG/ML IJ SOLN
25.0000 mg | Freq: Once | INTRAMUSCULAR | Status: AC
  Administered 2022-03-10: 25 mg via INTRAVENOUS
  Filled 2022-03-10: qty 1

## 2022-03-10 MED ORDER — SODIUM CHLORIDE 0.9 % IV BOLUS
1000.0000 mL | Freq: Once | INTRAVENOUS | Status: AC
  Administered 2022-03-10: 1000 mL via INTRAVENOUS

## 2022-03-10 MED ORDER — PROCHLORPERAZINE EDISYLATE 10 MG/2ML IJ SOLN
10.0000 mg | Freq: Once | INTRAMUSCULAR | Status: AC
  Administered 2022-03-10: 10 mg via INTRAVENOUS
  Filled 2022-03-10: qty 2

## 2022-03-10 NOTE — Discharge Instructions (Addendum)
Your laboratory results, the CT scan of your head did not show any acute findings.  I have placed a number to a neurologist, please schedule an appointment for further management of your ongoing headaches.
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
## 2022-03-10 NOTE — ED Triage Notes (Signed)
Patient BIB [facility]EM[facility] C/o weakness on right side x 1 week Pain in right thigh/feels like pressure Headache x 2 days  Stroke screen negative with EMS  Patient rates pain 10/10 States she had stroke 1 month ago Went to [employer] that episode started as headaches and back pain with mouth droop

## 2022-03-10 NOTE — ED Notes (Signed)
Gave patient a sandwich and soda

## 2022-03-12 ENCOUNTER — Ambulatory Visit (HOSPITAL_COMMUNITY): Admission: RE | Admit: 2022-03-12 | Payer: Medicaid Other | Source: Ambulatory Visit

## 2022-03-20 ENCOUNTER — Ambulatory Visit: Payer: Medicaid Other | Admitting: Neurology

## 2022-04-05 ENCOUNTER — Ambulatory Visit: Payer: Medicaid Other | Admitting: Family Medicine

## 2022-04-18 ENCOUNTER — Ambulatory Visit (HOSPITAL_COMMUNITY)
Admission: EM | Admit: 2022-04-18 | Discharge: 2022-04-18 | Disposition: A | Discharge status: Home / Self Care | Payer: Medicaid Other | Attending: Psychiatry | Admitting: Psychiatry

## 2022-04-18 DIAGNOSIS — F69 Unspecified disorder of adult personality and behavior: Secondary | ICD-10-CM

## 2022-04-18 DIAGNOSIS — F79 Unspecified intellectual disabilities: Secondary | ICD-10-CM | POA: Insufficient documentation

## 2022-04-18 DIAGNOSIS — F419 Anxiety disorder, unspecified: Secondary | ICD-10-CM

## 2022-04-18 DIAGNOSIS — R4689 Other symptoms and signs involving appearance and behavior: Secondary | ICD-10-CM

## 2022-04-18 DIAGNOSIS — R440 Auditory hallucinations: Secondary | ICD-10-CM | POA: Insufficient documentation
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
## 2022-04-18 NOTE — Discharge Instructions (Addendum)
F/u with walk-in psychiatric for medication mangement     Outpatient Services for Therapy and Medication Management for Medicaid  Based on what you have shared, a list of resources for outpatient therapy and psychiatry is provided below to get you started back on treatment.  It is imperative that you follow through with treatment within 5-7 days from the day of discharge to prevent any further risk to your safety or mental well-being.  You are not limited to the list provided.  In case of an urgent crisis, you may contact the Mobile Crisis Unit with [facility] at [phone].         [facility] [address], [place], [address] [phone] phone  New Patient Assessment/Therapy Walk-ins Monday and Wednesday: 8am until slots are full. Every 1st and 2nd Friday: 1pm - 5pm  NO ASSESSMENT/THERAPY WALK-INS ON TUESDAYS OR THURSDAYS  New Patient Psychiatry/Medication Management Walk-ins Monday-Friday: 8am-11am  For all walk-ins, we ask that you arrive by 7:30am because patient will be seen in the order of arrival.  Availability is limited; therefore, you may not be seen on the same day that you walk-in.  Our goal is to serve and meet the needs of our community to the best of our ability.   [facility] [address] [place], [place], [address] [phone] phone  [facility] [address] [place], [place], [address] [phone] phone [phone] phone ([address], [address], Anthem/Elevance, [address], [address], [address], [address], Healthy [place], [place], [place], [address], [employer], [place], UHC, [employer] Financial, [place], Out of Network)  [employer], [place] [address] [place], [place], [address] [phone] phone ([place], Anthem/Elevance, [employer] Options/Carelon, BCBS, [address], [place], [place], [place],  [place], [employer], [place], [place], [place], Health[facility])  [employer] [facility] [address] [place], [place], [address] [phone] phone (Medicaid, ask about other insurance)  [facility] [address] [place], [place], [address] [phone] phone [phone] fax ([address] [place] , [address], [place], [place] Health Choice, UHC, General Electric, Self-Pay)  [facility] [address] [place], [place], [address] [phone] phone ([address], Anthem/Elevance, [address], [address], [place], [employer], [place], [place], [place], [employer], [place], [place], [place], [facility])  [facility] - 6-8 MONTH WAIT FOR THERAPY; [facility] FOR MEDICATION MANAGEMENT [address] [place], [place], [address] [phone] phone ([address] Amer[employer]Health [address] - [place], [address], [place], [place], Friday Health Plans, [address], BCBS Healthy [place], [place], [address], [place], [place], [place], [place], Tricare, UHC, Safeco Corporation, [place])  [facility] [address], [address] [place], [place], [address] [phone] phone  [facility] [address], [address] [place], [place], [address] [phone] phone  [facility] [address] [place], [place], [address] [phone] phone  [facility] [place] - THERAPY ONLY [address] [place], [address] [phone] phone  [facility], [place] [address], [place], [address] [phone] phone  [facility] [address] [place], [place], [address] [phone] phone [phone] fax  [facility] [address] [place], [place], [address] [phone] phone [phone] fax  [facility] [phone] [address] [place], [place], [address] [phone] phone  [facility] Kussmaul [address] [place], [place], [address]  [phone] phone

## 2022-04-19 ENCOUNTER — Ambulatory Visit (INDEPENDENT_AMBULATORY_CARE_PROVIDER_SITE_OTHER): Payer: Medicaid Other | Admitting: Psychiatry

## 2022-04-19 VITALS — BP 134/85 | HR 66 | Wt 234.1 lb

## 2022-04-19 DIAGNOSIS — F4310 Post-traumatic stress disorder, unspecified: Secondary | ICD-10-CM

## 2022-04-19 DIAGNOSIS — F79 Unspecified intellectual disabilities: Secondary | ICD-10-CM | POA: Diagnosis not present

## 2022-04-19 DIAGNOSIS — F333 Major depressive disorder, recurrent, severe with psychotic symptoms: Secondary | ICD-10-CM

## 2022-04-19 DIAGNOSIS — F5105 Insomnia due to other mental disorder: Secondary | ICD-10-CM

## 2022-04-19 DIAGNOSIS — F639 Impulse disorder, unspecified: Secondary | ICD-10-CM

## 2022-04-19 DIAGNOSIS — F99 Mental disorder, not otherwise specified: Secondary | ICD-10-CM

## 2022-04-19 DIAGNOSIS — F902 Attention-deficit hyperactivity disorder, combined type: Secondary | ICD-10-CM

## 2022-04-19 MED ORDER — GUANFACINE HCL ER 1 MG PO TB24: 1.0000 mg | ORAL_TABLET | Freq: Every day | ORAL | 1 refills | Status: DC

## 2022-04-19 MED ORDER — ARIPIPRAZOLE 5 MG PO TABS: ORAL_TABLET | ORAL | 0 refills | Status: DC

## 2022-04-19 MED ORDER — ARIPIPRAZOLE ER 400 MG IM PRSY: 400.0000 mg | PREFILLED_SYRINGE | Freq: Once | INTRAMUSCULAR | Status: DC
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
## 2022-04-24 ENCOUNTER — Encounter (HOSPITAL_COMMUNITY): Payer: Self-pay | Admitting: *Deleted

## 2022-04-24 ENCOUNTER — Ambulatory Visit (HOSPITAL_COMMUNITY)
Admission: EM | Admit: 2022-04-24 | Discharge: 2022-04-24 | Disposition: A | Discharge status: Home / Self Care | Payer: Medicaid Other | Attending: Family Medicine | Admitting: Family Medicine

## 2022-04-24 DIAGNOSIS — N939 Abnormal uterine and vaginal bleeding, unspecified: Secondary | ICD-10-CM | POA: Insufficient documentation

## 2022-04-24 DIAGNOSIS — Z3202 Encounter for pregnancy test, result negative: Secondary | ICD-10-CM | POA: Insufficient documentation

## 2022-04-24 LAB — POCT URINE PREGNANCY: Preg Test, Ur: NEGATIVE

## 2022-04-24 LAB — HCG, QUANTITATIVE, PREGNANCY: hCG, Beta Chain, Quant, S: <1 m[IU]/mL (ref <5)

## 2022-04-24 NOTE — Discharge Instructions (Signed)
You were seen today for possible pregnancy. Your pregnancy test here was negative.  However, given your positive test at home yesterday, I would like to order blood work today.  This will be resulted tomorrow and if anything is positive we will call to discuss.  If this test is positive, it will need to be repeated in 2 days for monitoring.
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
## 2022-04-24 NOTE — ED Triage Notes (Signed)
Pt states she had a at home pregnancy test last night. She state she is having some vaginal bleeding that started yesterday but no abdominal pain.

## 2022-05-05 ENCOUNTER — Other Ambulatory Visit: Payer: Self-pay

## 2022-05-05 ENCOUNTER — Emergency Department (HOSPITAL_COMMUNITY)
Admission: EM | Admit: 2022-05-05 | Discharge: 2022-05-05 | Disposition: A | Discharge status: Home / Self Care | Payer: Medicaid Other | Attending: Emergency Medicine | Admitting: Emergency Medicine

## 2022-05-05 ENCOUNTER — Encounter (HOSPITAL_COMMUNITY): Payer: Self-pay

## 2022-05-05 DIAGNOSIS — Z7951 Long term (current) use of inhaled steroids: Secondary | ICD-10-CM | POA: Diagnosis not present

## 2022-05-05 DIAGNOSIS — J45909 Unspecified asthma, uncomplicated: Secondary | ICD-10-CM | POA: Diagnosis not present

## 2022-05-05 DIAGNOSIS — N3000 Acute cystitis without hematuria: Secondary | ICD-10-CM

## 2022-05-05 DIAGNOSIS — M546 Pain in thoracic spine: Secondary | ICD-10-CM

## 2022-05-05 DIAGNOSIS — M549 Dorsalgia, unspecified: Secondary | ICD-10-CM | POA: Diagnosis present

## 2022-05-05 LAB — PREGNANCY, URINE: Preg Test, Ur: NEGATIVE

## 2022-05-05 LAB — URINALYSIS, ROUTINE W REFLEX MICROSCOPIC
Bilirubin Urine: NEGATIVE
Glucose, UA: NEGATIVE mg/dL
Hgb urine dipstick: NEGATIVE
Ketones, ur: NEGATIVE mg/dL
Nitrite: NEGATIVE
Protein, ur: 100 mg/dL — AB
Specific Gravity, Urine: 1.026 (ref 1.005–1.030)
WBC, UA: >50 WBC/hpf (ref 0–5)
pH: 5 (ref 5.0–8.0)

## 2022-05-05 MED ORDER — LIDOCAINE 5 % EX PTCH
1.0000 | MEDICATED_PATCH | CUTANEOUS | Status: DC
  Administered 2022-05-05: 1 via TRANSDERMAL
  Filled 2022-05-05: qty 1

## 2022-05-05 MED ORDER — IBUPROFEN 600 MG PO TABS: 600.0000 mg | ORAL_TABLET | Freq: Four times a day (QID) | ORAL | 0 refills | Status: DC | PRN

## 2022-05-05 MED ORDER — CYCLOBENZAPRINE HCL 10 MG PO TABS: 10.0000 mg | ORAL_TABLET | Freq: Two times a day (BID) | ORAL | 0 refills | Status: DC | PRN

## 2022-05-05 MED ORDER — ACETAMINOPHEN 325 MG PO TABS: 650.0000 mg | ORAL_TABLET | Freq: Four times a day (QID) | ORAL | 0 refills | Status: DC | PRN

## 2022-05-05 MED ORDER — NITROFURANTOIN MONOHYD MACRO 100 MG PO CAPS: 100.0000 mg | ORAL_CAPSULE | Freq: Two times a day (BID) | ORAL | 0 refills | Status: DC

## 2022-05-05 MED ORDER — ACETAMINOPHEN 325 MG PO TABS
650.0000 mg | ORAL_TABLET | Freq: Once | ORAL | Status: AC
  Administered 2022-05-05: 650 mg via ORAL
  Filled 2022-05-05: qty 2

## 2022-05-05 MED ORDER — IBUPROFEN 800 MG PO TABS
800.0000 mg | ORAL_TABLET | Freq: Once | ORAL | Status: AC
  Administered 2022-05-05: 800 mg via ORAL
  Filled 2022-05-05: qty 1

## 2022-05-05 NOTE — ED Triage Notes (Signed)
Pt to er, pt states that she awoke this am with some back pain, states that she is also has some bright red blood in her spit. Pt talking in full sentences, resps even and unlabored, pt ambulatory to ft 9
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
## 2022-05-05 NOTE — Discharge Instructions (Addendum)
Please take your medications as prescribed. Take tylenol/ibuprofen for pain. I recommend close follow-up with PCP for reevaluation.  Please do not hesitate to return to emergency department if worrisome signs symptoms we discussed become apparent.  

## 2022-05-10 ENCOUNTER — Ambulatory Visit: Payer: Medicaid Other | Admitting: Family Medicine

## 2022-05-10 ENCOUNTER — Ambulatory Visit (INDEPENDENT_AMBULATORY_CARE_PROVIDER_SITE_OTHER): Payer: Medicaid Other

## 2022-05-10 ENCOUNTER — Other Ambulatory Visit (HOSPITAL_COMMUNITY): Payer: Self-pay | Admitting: Psychiatry

## 2022-05-10 ENCOUNTER — Ambulatory Visit (INDEPENDENT_AMBULATORY_CARE_PROVIDER_SITE_OTHER): Payer: Medicaid Other | Admitting: Psychiatry

## 2022-05-10 ENCOUNTER — Encounter (HOSPITAL_COMMUNITY): Payer: Self-pay

## 2022-05-10 VITALS — BP 155/101 | HR 105 | Resp 15 | Ht 62.0 in | Wt 234.2 lb

## 2022-05-10 DIAGNOSIS — F639 Impulse disorder, unspecified: Secondary | ICD-10-CM

## 2022-05-10 DIAGNOSIS — F79 Unspecified intellectual disabilities: Secondary | ICD-10-CM

## 2022-05-10 DIAGNOSIS — F200 Paranoid schizophrenia: Secondary | ICD-10-CM

## 2022-05-10 DIAGNOSIS — F902 Attention-deficit hyperactivity disorder, combined type: Secondary | ICD-10-CM

## 2022-05-10 DIAGNOSIS — Z79899 Other long term (current) drug therapy: Secondary | ICD-10-CM

## 2022-05-10 DIAGNOSIS — G4700 Insomnia, unspecified: Secondary | ICD-10-CM | POA: Diagnosis not present

## 2022-05-10 DIAGNOSIS — F319 Bipolar disorder, unspecified: Secondary | ICD-10-CM

## 2022-05-10 DIAGNOSIS — F333 Major depressive disorder, recurrent, severe with psychotic symptoms: Secondary | ICD-10-CM

## 2022-05-10 DIAGNOSIS — F5105 Insomnia due to other mental disorder: Secondary | ICD-10-CM

## 2022-05-10 DIAGNOSIS — F39 Unspecified mood [affective] disorder: Secondary | ICD-10-CM

## 2022-05-10 DIAGNOSIS — F4310 Post-traumatic stress disorder, unspecified: Secondary | ICD-10-CM

## 2022-05-10 MED ORDER — HYDROXYZINE HCL 10 MG PO TABS: 10.0000 mg | ORAL_TABLET | Freq: Three times a day (TID) | ORAL | 3 refills | Status: DC | PRN

## 2022-05-10 MED ORDER — ABILIFY MAINTENA 400 MG IM SRER: 400.0000 mg | INTRAMUSCULAR | 11 refills | Status: DC

## 2022-05-10 MED ORDER — TRAZODONE HCL 50 MG PO TABS: 50.0000 mg | ORAL_TABLET | Freq: Every day | ORAL | 3 refills | Status: DC

## 2022-05-10 MED ORDER — GUANFACINE HCL ER 1 MG PO TB24: 1.0000 mg | ORAL_TABLET | Freq: Every day | ORAL | 3 refills | Status: DC

## 2022-05-10 MED ORDER — ARIPIPRAZOLE ER 400 MG IM PRSY: 400.0000 mg | PREFILLED_SYRINGE | Freq: Once | INTRAMUSCULAR | Status: DC

## 2022-05-10 NOTE — Progress Notes (Signed)
PATIENT PRESENTS TO THE OFFICE FOR ABILIFY MAINTENA 400 MG INJECTION , GIVEN BY Shardae Kleinman PT TOLERATED INJECTION WELL AND WILL RETURN IN 28 DAYS
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
## 2022-05-14 ENCOUNTER — Telehealth: Payer: Self-pay

## 2022-05-14 ENCOUNTER — Ambulatory Visit: Payer: Self-pay | Admitting: Student

## 2022-05-14 NOTE — Telephone Encounter (Signed)
She can come in for an appointment for a pregnancy test. It is unlikely she is pregnant as she had her tubes removed last year, but we can double check with an in-office test as pregnancy is still theoretically possible. She presented to urgent care 2/22 and 4/16 saying she had positive home tests but tests at those visits were negative.   I would recommend she schedule a visit and bring with her the home test that she thinks is positive. She could be misreading the test.  Thanks Latrelle Dodrill, MD

## 2022-05-14 NOTE — Telephone Encounter (Signed)
Patient calls nurse line requesting an apt to confirm pregnancy.   She reports she had a positive pregnancy test at home today. She reports was seen recently and had a negative blood test and negative urine pregnancy test at [facility].   She stated, "No one believes me when I tell them about the positive home test."   She denies any AUB or abdominal pain.   Will forward to PCP for advisement.

## 2022-05-14 NOTE — Telephone Encounter (Signed)
Patient scheduled for tomorrow

## 2022-05-15 ENCOUNTER — Ambulatory Visit (INDEPENDENT_AMBULATORY_CARE_PROVIDER_SITE_OTHER): Payer: Medicaid Other | Admitting: Student

## 2022-05-15 VITALS — BP 132/96 | HR 72 | Wt 236.0 lb

## 2022-05-15 DIAGNOSIS — Z3200 Encounter for pregnancy test, result unknown: Secondary | ICD-10-CM | POA: Diagnosis present

## 2022-05-15 LAB — POCT URINE PREGNANCY: Preg Test, Ur: NEGATIVE

## 2022-05-15 NOTE — Progress Notes (Signed)
  SUBJECTIVE:   CHIEF COMPLAINT / HPI:   Concern for pregnancy: She gets regular periods, and was concerned because she is 3 days late. She was feeling a little more fatigued however it has improved.  She thought she was pregnant because there is a trace white/blue outline on the pregnancy test.  According to telephone note, patient reported positive pregnancy test yesterday.  Patient does have surgical history of bilateral tubal ligation on 07/27/2021.  Patient also got her left third finger stuck in the doorway when she went to the bathroom during clinic.  She was provided ice.  PERTINENT  PMH / PSH: PTSD, intellectual disability, ADHD, HTN, seizure disorder  Patient Care Team: Latrelle Dodrill, MD as PCP - General (Family Medicine) Latrelle Dodrill, MD as PCP - OBGYN (Family Medicine) Linna Darner, RD as Dietitian (Family Medicine) OBJECTIVE:  BP (!) 132/96   Pulse 72   Wt 236 lb (107 kg)   LMP 04/02/2022 (Exact Date)   SpO2 98%   BMI 43.16 kg/m  Gen: well appearing, NAD Abdomen: soft, nontender, normoactive bowel sounds Left third DIP: Tender left third DIP, trace swelling and erythema, intact ROM  ASSESSMENT/PLAN:  Possible pregnancy Assessment & Plan: Negative pregnancy test.  Counseled on how to properly read pregnancy test.  She was relieved that she is not pregnant.  Orders: -     POCT urine pregnancy  Injured third DIP Buddy taped upon request, ice provided, return precautions discussed.  Elevated blood pressure Suspect related to recent injury with finger.   No follow-ups on file. Shelby Mattocks, DO 05/15/2022, 4:01 PM PGY-2, [facility]

## 2022-05-15 NOTE — Assessment & Plan Note (Signed)
Negative pregnancy test.  Counseled on how to properly read pregnancy test.  She was relieved that she is not pregnant.

## 2022-05-17 ENCOUNTER — Encounter (HOSPITAL_COMMUNITY): Payer: Medicaid Other | Admitting: Psychiatry

## 2022-05-21 ENCOUNTER — Ambulatory Visit: Payer: Medicaid Other

## 2022-05-24 ENCOUNTER — Telehealth: Payer: Self-pay

## 2022-05-24 NOTE — Progress Notes (Signed)
   Lisa [name] 02/22/1995 [id number]  Patient outreached by Frederic Jericho, PharmD Candidate on 05/24/2022 while they were picking up prescriptions at [facility].  Blood Pressure Readings: Last documented ambulatory systolic blood pressure: 132 Last documented ambulatory diastolic blood pressure: 96 Does the patient have a validated home blood pressure machine?: No  Patient reported that they used to take their BP at home, but does not take it now.  Medication review was performed. Yes  Differences from their prescribed list include: Patient reported receiving a shot that is suppose to last 30 days, but it is not reported within the medication list.  The following barriers to adherence were noted: Does the patient have cost concerns?: No Does the patient have transportation concerns?: No Does the patient need assistance obtaining refills?: No Does the patient occassionally forget to take some of their prescribed medications?: No Does the patient feel like one/some of their medications make them feel poorly?: No Does the patient have questions or concerns about their medications?: No Does the patient have a follow up scheduled with their primary care provider/cardiologist?: Yes  Patient also expressed irregular periods (3 days instead of 7 days duration) and back/side pain. She also expressed that should would like to talk to her PCP about high-dose ibuprofen for the back/side pain because OTC isn't working as well.  Interventions: Interventions Completed: Medications were reviewed, Patient was educated on goal blood pressures and long term health implications of elevated blood pressure  The patient has follow up scheduled:  PCP: Latrelle Dodrill, MD   Frederic Jericho, Student-PharmD

## 2022-05-28 ENCOUNTER — Ambulatory Visit: Payer: Medicaid Other | Admitting: Family Medicine

## 2022-05-30 ENCOUNTER — Encounter: Payer: Self-pay | Admitting: Family Medicine

## 2022-05-30 ENCOUNTER — Ambulatory Visit (INDEPENDENT_AMBULATORY_CARE_PROVIDER_SITE_OTHER): Payer: Medicaid Other | Admitting: Family Medicine

## 2022-05-30 ENCOUNTER — Other Ambulatory Visit: Payer: Self-pay

## 2022-05-30 VITALS — BP 115/80 | HR 85 | Wt 235.0 lb

## 2022-05-30 DIAGNOSIS — R519 Headache, unspecified: Secondary | ICD-10-CM | POA: Diagnosis present

## 2022-05-30 DIAGNOSIS — G4719 Other hypersomnia: Secondary | ICD-10-CM

## 2022-05-30 DIAGNOSIS — R112 Nausea with vomiting, unspecified: Secondary | ICD-10-CM

## 2022-05-30 MED ORDER — IBUPROFEN 600 MG PO TABS: 600.0000 mg | ORAL_TABLET | Freq: Four times a day (QID) | ORAL | 0 refills | Status: DC | PRN

## 2022-05-30 MED ORDER — ONDANSETRON 4 MG PO TBDP: 4.0000 mg | ORAL_TABLET | Freq: Three times a day (TID) | ORAL | 0 refills | Status: DC | PRN

## 2022-05-30 MED ORDER — ACETAMINOPHEN 325 MG PO TABS: 650.0000 mg | ORAL_TABLET | Freq: Four times a day (QID) | ORAL | 0 refills | Status: AC | PRN

## 2022-05-30 NOTE — Progress Notes (Addendum)
SUBJECTIVE:   CHIEF COMPLAINT / HPI:   Lisa [name] is a 27 y.o. female who presents to the [facility] clinic today to discuss the following concerns:   HEADACHE  Onset: Awoke with it this morning with headache Location: "In middle of head" Quality: Throbbing  Frequency: "every now and then" Precipitating factors: "the loudness and me talking a lot" Prior treatment: Ibuprofen- didn't take any this morning  Associated Symptoms Nausea/vomiting: yes, vomited once  Photophobia/phonophobia: yes  Tearing of eyes: yes  Sinus pain/pressure: no  Family hx migraine: yes, mother has them. Personal stressors: yes, work-related stress. Currently living in a hotel  Relation to menstrual cycle: no. LMP was 2 weeks ago. Has hx BTL.   Red Flags Fever: no  Neck pain/stiffness: no  Vision/speech/swallow/hearing difficulty: no  Focal weakness/numbness: states she feels some numbness to right thigh   Altered mental status: no  Trauma: no  New type of headache: yes, but is having a hard time describing the difference Anticoagulant use: no  H/o cancer/HIV/Pregnancy: no   Takes her a while to sleep. The trazodone somewhat helps. Snores at night. Has daytime sleepiness, easily falls asleep during the day. Doesn't feel well rested in the morning. Not drinking much water- mostly drinks juice and soda. Doesn't eat regular meals.    PERTINENT  PMH / PSH: HTN, pseudoseizures, PTSD, ADHD, GAD, MDD   OBJECTIVE:   BP 115/80   Pulse 85   Wt 235 lb (106.6 kg)   SpO2 100%   BMI 42.98 kg/m    General: NAD, pleasant, able to participate in exam Cardiac: RRR, no murmurs. Respiratory: CTAB, normal effort, No wheezes, rales or rhonchi Abdomen: Obese Extremities: no edema  Skin: warm and dry Neuro:  Cranial Nerves: II: Visual Fields are full. PERRL.  III,IV, VI: EOMI without ptosis or diplopia.  V: Facial sensation is symmetric to temperature VII: Facial movement is symmetric.  VIII: hearing is  intact to voice X: Palate elevates symmetrically XI: Shoulder shrug is symmetric. XII: Tongue is midline without atrophy or fasciculations.  Motor: Tone is normal. Bulk is normal. 5/5 strength was present in all four extremities.  Sensory: Reports asymmetric sensation to light touch and temperature in various different parts of body  Cerebellar: FNF and HKS are intact bilaterally Psych: Normal affect and mood  ASSESSMENT/PLAN:   1. Excessive daytime sleepiness Has several risk factors for sleep apnea. STOP-BANG score of 4 which places her at moderate to severe risk for OSA.  She would benefit from sleep study as this could be contributing to her recurrent headaches. - Split night study; Future  2. Acute nonintractable headache, unspecified headache type Has components of migraine and tension-type headache. Several risk factors for tension-type headaches. OSA could also be contributing. Doubt more serious intracranial pathology such as stroke, brain tumor. Had overall reassuring neurological examination, vitals stable.  - Split night study; Future - ibuprofen (ADVIL) 600 MG tablet; Take 1 tablet (600 mg total) by mouth every 6 (six) hours as needed.  Dispense: 30 tablet; Refill: 0 - acetaminophen (TYLENOL) 325 MG tablet; Take 2 tablets (650 mg total) by mouth every 6 (six) hours as needed.  Dispense: 20 tablet; Refill: 0 - See AVS for additional information- headache diary - Strict ED precautions given   3. Nausea and vomiting, unspecified vomiting type One episode today. Will provide short-supply of Zofran for symptomatic management.  - ondansetron (ZOFRAN-ODT) 4 MG disintegrating tablet; Take 1 tablet (4 mg total) by mouth  every 8 (eight) hours as needed for nausea or vomiting.  Dispense: 10 tablet; Refill: 0    Sabino Dick, DO [facility]

## 2022-05-30 NOTE — Patient Instructions (Signed)
It was wonderful to see you today.  Please bring ALL of your medications with you to every visit.   Today we talked about:  I have sent some Tylenol and Ibuprofen to use as needed every 6 hours for your headache.  I have sent some Zofran for your nausea that you can take every 8 hours as needed for nausea or vomiting.   Please seek immediate attention if you have "the worst headache of your life", have vision changes, slurred speech, any weakness, difficulty walking.   I believe that you have a tension headache. Typically these are caused by not getting enough sleep, not eating regular meals, not drinking enough water, not exercising, and/or significant stress. Improving in these areas should help!   Additionally, there is such thing as medication overuse headaches. I would limit the use of Tylenol or Ibuprofen to one time weekly only for significant headaches.   I have attached a headache diary below which can help identify triggers as well.   Please return if the headaches cause vision troubles, weakness, nausea/vomiting, gait changes, or change in the way they feel.  Headache Diary   Date         Day of week (Mon-Sun)         Time headache began         Time headache ended         Intensity of pain  (1-10)         Other symptoms  (nausea, vomiting, bothered by light/sound)         Did you take medicine?         Intensity of pain after medication  (1-10)         Hours of sleep night before headache         Activities before headache         Important/stressful events that day           Thank you for coming to your visit as scheduled. We have had a large "no-show" problem lately, and this significantly limits our ability to see and care for patients. As a friendly reminder- if you cannot make your appointment please call to cancel. We do have a no show policy for those who do not cancel within 24 hours. Our policy is that if you miss or fail to cancel an  appointment within 24 hours, 3 times in a 68-month period, you may be dismissed from our clinic.   Thank you for choosing [facility].   Please call [phone] with any questions about today's appointment.  Please be sure to schedule follow up at the front  desk before you leave today.   Sabino Dick, DO PGY-3 Family Medicine

## 2022-06-12 ENCOUNTER — Encounter (HOSPITAL_COMMUNITY): Payer: Self-pay

## 2022-06-12 ENCOUNTER — Ambulatory Visit (INDEPENDENT_AMBULATORY_CARE_PROVIDER_SITE_OTHER): Payer: Medicaid Other | Admitting: Psychiatry

## 2022-06-12 ENCOUNTER — Ambulatory Visit (INDEPENDENT_AMBULATORY_CARE_PROVIDER_SITE_OTHER): Payer: Medicaid Other

## 2022-06-12 VITALS — BP 119/80 | Wt 234.6 lb

## 2022-06-12 DIAGNOSIS — F200 Paranoid schizophrenia: Secondary | ICD-10-CM | POA: Diagnosis not present

## 2022-06-12 DIAGNOSIS — F319 Bipolar disorder, unspecified: Secondary | ICD-10-CM

## 2022-06-12 DIAGNOSIS — F411 Generalized anxiety disorder: Secondary | ICD-10-CM

## 2022-06-12 MED ORDER — GABAPENTIN 100 MG PO CAPS: 100.0000 mg | ORAL_CAPSULE | Freq: Three times a day (TID) | ORAL | 3 refills | Status: DC

## 2022-06-12 MED ORDER — DIVALPROEX SODIUM ER 500 MG PO TB24: 500.0000 mg | ORAL_TABLET | Freq: Two times a day (BID) | ORAL | 3 refills | Status: DC

## 2022-06-12 MED ORDER — ABILIFY MAINTENA 400 MG IM SRER: 400.0000 mg | INTRAMUSCULAR | 11 refills | Status: DC

## 2022-06-12 MED ORDER — ARIPIPRAZOLE ER 400 MG IM PRSY
400.0000 mg | PREFILLED_SYRINGE | Freq: Once | INTRAMUSCULAR | Status: AC
  Administered 2022-06-12: 400 mg via INTRAMUSCULAR

## 2022-06-12 MED ORDER — TRAZODONE HCL 100 MG PO TABS: 100.0000 mg | ORAL_TABLET | Freq: Every day | ORAL | 3 refills | Status: DC

## 2022-06-12 MED ORDER — TRAZODONE HCL 50 MG PO TABS: 50.0000 mg | ORAL_TABLET | Freq: Every day | ORAL | 3 refills | Status: DC

## 2022-06-12 NOTE — Progress Notes (Signed)
  PATIENT PRESENTS TO THE OFFICE FOR ABILIFY MAINTENA 400 MG INJECTION IN LEFT DELTOID, GIVEN BY Rubyann Lingle PT TOLERATED INJECTION WELL AND WILL RETURN IN 28 DAYS
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
## 2022-07-03 ENCOUNTER — Emergency Department (HOSPITAL_COMMUNITY): Payer: Medicaid Other

## 2022-07-03 ENCOUNTER — Emergency Department (HOSPITAL_COMMUNITY)
Admission: EM | Admit: 2022-07-03 | Discharge: 2022-07-03 | Disposition: A | Discharge status: Home / Self Care | Payer: Medicaid Other | Attending: Emergency Medicine | Admitting: Emergency Medicine

## 2022-07-03 DIAGNOSIS — S0992XA Unspecified injury of nose, initial encounter: Secondary | ICD-10-CM | POA: Diagnosis present

## 2022-07-03 DIAGNOSIS — M542 Cervicalgia: Secondary | ICD-10-CM | POA: Insufficient documentation

## 2022-07-03 DIAGNOSIS — S0083XA Contusion of other part of head, initial encounter: Secondary | ICD-10-CM

## 2022-07-03 DIAGNOSIS — R000 Tachycardia, unspecified: Secondary | ICD-10-CM | POA: Diagnosis not present

## 2022-07-03 DIAGNOSIS — G40909 Epilepsy, unspecified, not intractable, without status epilepticus: Secondary | ICD-10-CM | POA: Insufficient documentation

## 2022-07-03 DIAGNOSIS — S0101XA Laceration without foreign body of scalp, initial encounter: Secondary | ICD-10-CM | POA: Diagnosis not present

## 2022-07-03 DIAGNOSIS — M25531 Pain in right wrist: Secondary | ICD-10-CM | POA: Diagnosis not present

## 2022-07-03 DIAGNOSIS — S022XXA Fracture of nasal bones, initial encounter for closed fracture: Secondary | ICD-10-CM | POA: Insufficient documentation

## 2022-07-03 DIAGNOSIS — S0990XA Unspecified injury of head, initial encounter: Secondary | ICD-10-CM

## 2022-07-03 LAB — COMPREHENSIVE METABOLIC PANEL
ALT: 12 U/L (ref 0–44)
AST: 15 U/L (ref 15–41)
Albumin: 3.2 g/dL — ABNORMAL LOW (ref 3.5–5.0)
Alkaline Phosphatase: 82 U/L (ref 38–126)
Anion gap: 11 (ref 5–15)
BUN: 10 mg/dL (ref 6–20)
CO2: 20 mmol/L — ABNORMAL LOW (ref 22–32)
Calcium: 9.5 mg/dL (ref 8.9–10.3)
Chloride: 108 mmol/L (ref 98–111)
Creatinine, Ser: 1.04 mg/dL — ABNORMAL HIGH (ref 0.44–1.00)
GFR, Estimated: >60 mL/min (ref >60)
Glucose, Bld: 120 mg/dL — ABNORMAL HIGH (ref 70–99)
Potassium: 3.6 mmol/L (ref 3.5–5.1)
Sodium: 139 mmol/L (ref 135–145)
Total Bilirubin: 0.3 mg/dL (ref 0.3–1.2)
Total Protein: 7.1 g/dL (ref 6.5–8.1)

## 2022-07-03 LAB — CBC
HCT: 33.7 % — ABNORMAL LOW (ref 36.0–46.0)
Hemoglobin: 10.8 g/dL — ABNORMAL LOW (ref 12.0–15.0)
MCH: 25.5 pg — ABNORMAL LOW (ref 26.0–34.0)
MCHC: 32 g/dL (ref 30.0–36.0)
MCV: 79.7 fL — ABNORMAL LOW (ref 80.0–100.0)
Platelets: 280 10*3/uL (ref 150–400)
RBC: 4.23 MIL/uL (ref 3.87–5.11)
RDW: 13.5 % (ref 11.5–15.5)
WBC: 8.2 10*3/uL (ref 4.0–10.5)
nRBC: 0 % (ref 0.0–0.2)

## 2022-07-03 LAB — ETHANOL: Alcohol, Ethyl (B): <10 mg/dL (ref <10)

## 2022-07-03 LAB — I-STAT CHEM 8, ED
BUN: 10 mg/dL (ref 6–20)
Calcium, Ion: 1.27 mmol/L (ref 1.15–1.40)
Chloride: 109 mmol/L (ref 98–111)
Creatinine, Ser: 1 mg/dL (ref 0.44–1.00)
Glucose, Bld: 117 mg/dL — ABNORMAL HIGH (ref 70–99)
HCT: 35 % — ABNORMAL LOW (ref 36.0–46.0)
Hemoglobin: 11.9 g/dL — ABNORMAL LOW (ref 12.0–15.0)
Potassium: 3.6 mmol/L (ref 3.5–5.1)
Sodium: 143 mmol/L (ref 135–145)
TCO2: 20 mmol/L — ABNORMAL LOW (ref 22–32)

## 2022-07-03 LAB — LACTIC ACID, PLASMA
Lactic Acid, Venous: 3.8 mmol/L (ref 0.5–1.9)
Lactic Acid, Venous: 1.1 mmol/L (ref 0.5–1.9)

## 2022-07-03 LAB — HCG, SERUM, QUALITATIVE: Preg, Serum: NEGATIVE

## 2022-07-03 LAB — CBG MONITORING, ED: Glucose-Capillary: 116 mg/dL — ABNORMAL HIGH (ref 70–99)

## 2022-07-03 MED ORDER — MECLIZINE HCL 25 MG PO TABS: 25.0000 mg | ORAL_TABLET | Freq: Three times a day (TID) | ORAL | 0 refills | Status: DC | PRN

## 2022-07-03 MED ORDER — LORAZEPAM 2 MG/ML IJ SOLN
INTRAMUSCULAR | Status: AC
  Filled 2022-07-03: qty 1

## 2022-07-03 MED ORDER — SODIUM CHLORIDE 0.9 % IV BOLUS
1000.0000 mL | Freq: Once | INTRAVENOUS | Status: AC
  Administered 2022-07-03: 1000 mL via INTRAVENOUS

## 2022-07-03 MED ORDER — LORAZEPAM 2 MG/ML IJ SOLN
1.0000 mg | Freq: Once | INTRAMUSCULAR | Status: AC
  Administered 2022-07-03: 1 mg via INTRAVENOUS

## 2022-07-03 MED ORDER — FENTANYL CITRATE PF 50 MCG/ML IJ SOSY
50.0000 ug | PREFILLED_SYRINGE | Freq: Once | INTRAMUSCULAR | Status: AC
  Administered 2022-07-03: 50 ug via INTRAVENOUS
  Filled 2022-07-03: qty 1

## 2022-07-03 MED ORDER — NAPROXEN 375 MG PO TABS: 375.0000 mg | ORAL_TABLET | Freq: Two times a day (BID) | ORAL | 0 refills | Status: DC

## 2022-07-03 MED ORDER — ONDANSETRON HCL 4 MG/2ML IJ SOLN
4.0000 mg | Freq: Once | INTRAMUSCULAR | Status: AC
  Administered 2022-07-03: 4 mg via INTRAVENOUS
  Filled 2022-07-03: qty 2

## 2022-07-03 MED ORDER — LIDOCAINE-EPINEPHRINE 1 %-1:100000 IJ SOLN
20.0000 mL | Freq: Once | INTRAMUSCULAR | Status: AC
  Administered 2022-07-03: 20 mL via INTRADERMAL
  Filled 2022-07-03: qty 1
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
## 2022-07-03 NOTE — ED Notes (Signed)
Pt c/o right wrist pain and headache.

## 2022-07-03 NOTE — Discharge Instructions (Addendum)
Get help right away if: You have sudden: Headache that is very bad. Vomiting that does not stop. Changes in the size of one of your pupils. Pupils are the black centers of your eyes. Changes in how you see (vision). More confusion or more grumpy moods. You have a seizure. Your symptoms get worse. You have a clear or bloody fluid coming from your nose or ears. These symptoms may be an emergency. Get help right away. Call 911. Do not wait to see if the symptoms will go away. Do not drive yourself to the hospital.   WOUND CARE Please have your stitches/staples removed in 7 or sooner if you have concerns. You may do this at any available urgent care or at your primary care doctor's office.  Keep area clean and dry for 24 hours. Do not remove bandage, if applied.  After 24 hours, remove bandage and wash wound gently with mild soap and warm water. Reapply a new bandage after cleaning wound, if directed.  Continue daily cleansing with soap and water until stitches/staples are removed.  Do not apply any ointments or creams to the wound while stitches/staples are in place, as this may cause delayed healing.  Seek medical careif you experience any of the following signs of infection: Swelling, redness, pus drainage, streaking, fever >101.0 F  Seek care if you experience excessive bleeding that does not stop after 15-20 minutes of constant, firm pressure.
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
## 2022-07-03 NOTE — ED Notes (Signed)
Date and time results received: 07/03/22 2015 (use smartphrase ".now" to insert current time)  Test: lactic acid Critical Value: 3.8  Name of Provider Notified: Tiburcio Pea, [place]  Orders Received? Or Actions Taken?:  notified

## 2022-07-03 NOTE — ED Notes (Signed)
Pt transported to CT with RN on portable monitor.

## 2022-07-03 NOTE — ED Notes (Signed)
Rutha Bouchard, PA in room assessing pt.

## 2022-07-04 ENCOUNTER — Other Ambulatory Visit: Payer: Self-pay | Admitting: Family Medicine

## 2022-07-10 ENCOUNTER — Ambulatory Visit (INDEPENDENT_AMBULATORY_CARE_PROVIDER_SITE_OTHER): Payer: MEDICAID | Admitting: Psychiatry

## 2022-07-10 ENCOUNTER — Ambulatory Visit: Payer: Medicaid Other | Admitting: Family Medicine

## 2022-07-10 ENCOUNTER — Encounter (HOSPITAL_COMMUNITY): Payer: Self-pay

## 2022-07-10 ENCOUNTER — Ambulatory Visit (HOSPITAL_COMMUNITY): Payer: MEDICAID

## 2022-07-10 ENCOUNTER — Encounter: Payer: Self-pay | Admitting: Family Medicine

## 2022-07-10 ENCOUNTER — Ambulatory Visit (INDEPENDENT_AMBULATORY_CARE_PROVIDER_SITE_OTHER): Payer: MEDICAID | Admitting: Student

## 2022-07-10 VITALS — BP 133/93 | HR 91 | Ht 62.0 in | Wt 242.0 lb

## 2022-07-10 DIAGNOSIS — F411 Generalized anxiety disorder: Secondary | ICD-10-CM | POA: Diagnosis not present

## 2022-07-10 DIAGNOSIS — G40909 Epilepsy, unspecified, not intractable, without status epilepticus: Secondary | ICD-10-CM

## 2022-07-10 DIAGNOSIS — F319 Bipolar disorder, unspecified: Secondary | ICD-10-CM | POA: Diagnosis not present

## 2022-07-10 DIAGNOSIS — F4310 Post-traumatic stress disorder, unspecified: Secondary | ICD-10-CM | POA: Diagnosis not present

## 2022-07-10 DIAGNOSIS — F902 Attention-deficit hyperactivity disorder, combined type: Secondary | ICD-10-CM

## 2022-07-10 DIAGNOSIS — F639 Impulse disorder, unspecified: Secondary | ICD-10-CM

## 2022-07-10 MED ORDER — HALOPERIDOL DECANOATE 100 MG/ML IM SOLN
50.0000 mg | INTRAMUSCULAR | Status: DC
Start: 2022-07-18 — End: 2023-01-15
  Administered 2022-07-18 – 2022-12-20 (×4): 50 mg via INTRAMUSCULAR

## 2022-07-10 MED ORDER — GUANFACINE HCL ER 1 MG PO TB24: 1.0000 mg | ORAL_TABLET | Freq: Every day | ORAL | 3 refills | Status: DC

## 2022-07-10 MED ORDER — GABAPENTIN 300 MG PO CAPS: 300.0000 mg | ORAL_CAPSULE | Freq: Three times a day (TID) | ORAL | 3 refills | Status: DC

## 2022-07-10 MED ORDER — TRAZODONE HCL 100 MG PO TABS: 100.0000 mg | ORAL_TABLET | Freq: Every day | ORAL | 3 refills | Status: DC

## 2022-07-10 MED ORDER — HALOPERIDOL 5 MG PO TABS
5.0000 mg | ORAL_TABLET | Freq: Two times a day (BID) | ORAL | 0 refills | Status: DC
Start: 2022-07-10 — End: 2022-07-16

## 2022-07-10 MED ORDER — HALOPERIDOL DECANOATE 50 MG/ML IM SOLN
50.0000 mg | INTRAMUSCULAR | 11 refills | Status: DC
Start: 2022-07-10 — End: 2023-01-15
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
## 2022-07-10 NOTE — Patient Instructions (Signed)
It was great to see you today! Thank you for choosing [facility] for your primary care. Lisa [name] was seen for follow up.  Today we addressed: We removed your staples We can grab your lab work at your next lab visit, please call ahead of time  Take your naproxen as indicated for ankle and headache I have attached concussion information and ankle exercises Return in 1 month  If you haven't already, sign up for My Chart to have easy access to your labs results, and communication with your primary care physician.  We are checking some labs today. If they are abnormal, I will call you. If they are normal, I will send you a MyChart message (if it is active) or a letter in the mail. If you do not hear about your labs in the next 2 weeks, please call the office. I recommend that you always bring your medications to each appointment as this makes it easy to ensure you are on the correct medications and helps [place] not miss refills when you need them. Call the clinic at [phone] if your symptoms worsen or you have any concerns.  You should return to our clinic Return in about 4 weeks (around 08/07/2022). Please arrive 15 minutes before your appointment to ensure smooth check in process.  We appreciate your efforts in making this happen.  Thank you for allowing me to participate in your care, Alfredo Martinez, MD 07/10/2022, 4:38 PM PGY-3, [facility]
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
## 2022-07-16 ENCOUNTER — Telehealth (HOSPITAL_COMMUNITY): Payer: Self-pay

## 2022-07-16 ENCOUNTER — Other Ambulatory Visit (HOSPITAL_COMMUNITY): Payer: Self-pay | Admitting: Psychiatry

## 2022-07-16 DIAGNOSIS — F319 Bipolar disorder, unspecified: Secondary | ICD-10-CM

## 2022-07-16 MED ORDER — HALOPERIDOL 5 MG PO TABS
5.0000 mg | ORAL_TABLET | Freq: Two times a day (BID) | ORAL | 0 refills | Status: DC
Start: 2022-07-16 — End: 2022-08-13

## 2022-07-16 NOTE — Telephone Encounter (Signed)
Medication refilled and sent patient denied pharmacy.  Patient and her mother notes that she is doing very well.  She reports that she is less irritable and more focused. She will follow up on 7/10 for her injection. No other concerns noted at this time.

## 2022-07-18 ENCOUNTER — Encounter (HOSPITAL_COMMUNITY): Payer: Self-pay

## 2022-07-18 ENCOUNTER — Ambulatory Visit (INDEPENDENT_AMBULATORY_CARE_PROVIDER_SITE_OTHER): Payer: MEDICAID

## 2022-07-18 VITALS — BP 135/94 | HR 84 | Ht 62.0 in | Wt 245.6 lb

## 2022-07-18 DIAGNOSIS — G4700 Insomnia, unspecified: Secondary | ICD-10-CM

## 2022-07-18 DIAGNOSIS — F411 Generalized anxiety disorder: Secondary | ICD-10-CM | POA: Diagnosis not present

## 2022-07-18 DIAGNOSIS — F200 Paranoid schizophrenia: Secondary | ICD-10-CM

## 2022-07-18 NOTE — Progress Notes (Cosign Needed)
Patient presents to the office for haldol 50 mg injection given by Lupita Leash , pt tolerated injection well and  will return in 28 days , pt mother stated pt is doing well on new meds and will continue to see how things go

## 2022-07-24 ENCOUNTER — Other Ambulatory Visit: Payer: Self-pay | Admitting: Obstetrics and Gynecology

## 2022-07-25 ENCOUNTER — Ambulatory Visit (HOSPITAL_BASED_OUTPATIENT_CLINIC_OR_DEPARTMENT_OTHER): Payer: MEDICAID | Attending: Family Medicine | Admitting: Internal Medicine

## 2022-07-25 DIAGNOSIS — G4719 Other hypersomnia: Secondary | ICD-10-CM | POA: Insufficient documentation

## 2022-07-25 DIAGNOSIS — R519 Headache, unspecified: Secondary | ICD-10-CM | POA: Insufficient documentation

## 2022-07-25 DIAGNOSIS — G4733 Obstructive sleep apnea (adult) (pediatric): Secondary | ICD-10-CM | POA: Insufficient documentation

## 2022-07-28 DIAGNOSIS — G4719 Other hypersomnia: Secondary | ICD-10-CM

## 2022-07-28 NOTE — Procedures (Signed)
   Patient Name: Lisa [name], Lisa [name] Date: 07/25/2022 Gender: Female D.O.B: Aug 22, 1995 Age (years): 27 Referring Provider: Westley Chandler Height (inches): 62 Interpreting Physician: Jetty Duhamel MD, ABSM Weight (lbs): 242 RPSGT: Lowry Ram BMI: 44 MRN: [id number] Neck Size: 15.50  CLINICAL INFORMATION Sleep Study Type: NPSG Indication for sleep study: Excessive Daytime Sleepiness, Fatigue, Morning Headaches, Obesity, Snoring Epworth Sleepiness Score: 23  Most recent polysomnogram dated 02/02/2016 revealed an AHI of 0.5/h and RDI of 0.6/h.  SLEEP STUDY TECHNIQUE As per the AASM Manual for the Scoring of Sleep and Associated Events v2.3 (April 2016) with a hypopnea requiring 4% desaturations.  The channels recorded and monitored were frontal, central and occipital EEG, electrooculogram (EOG), submentalis EMG (chin), nasal and oral airflow, thoracic and abdominal wall motion, anterior tibialis EMG, snore microphone, electrocardiogram, and pulse oximetry.  MEDICATIONS Medications self-administered by patient taken the night of the study : GABAPENTIN, TRAZODONE  SLEEP ARCHITECTURE The study was initiated at 9:59:49 PM and ended at 5:03:56 AM.  Sleep onset time was 13.3 minutes and the sleep efficiency was 95.9%. The total sleep time was 406.8 minutes.  Stage REM latency was 89.5 minutes.  The patient spent 0.6% of the night in stage N1 sleep, 69.3% in stage N2 sleep, 0.0% in stage N3 and 30.1% in REM.  Alpha intrusion was absent.  Supine sleep was 100.00%.  RESPIRATORY PARAMETERS The overall apnea/hypopnea index (AHI) was 5.2 per hour. There were 1 total apneas, including 1 obstructive, 0 central and 0 mixed apneas. There were 34 hypopneas and 2 RERAs.  The AHI during Stage REM sleep was 16.2 per hour.  AHI while supine was 5.2 per hour.  The mean oxygen saturation was 96.2%. The minimum SpO2 during sleep was 87.0%.  soft snoring was noted during this  study.  CARDIAC DATA The 2 lead EKG demonstrated sinus rhythm. The mean heart rate was 72.8 beats per minute. Other EKG findings include: None.  LEG MOVEMENT DATA The total PLMS were 0 with a resulting PLMS index of 0.0. Associated arousal with leg movement index was 1.8 .  IMPRESSIONS - Minimal obstructive sleep apnea occurred during this study (AHI = 5.2/h). - Insufficient events to meet protocol requirement for split CPAP titration. - Mild oxygen desaturation was noted during this study (Min O2 = 87.0%, Mean 96.2%). - The patient snored with soft snoring volume. - No cardiac abnormalities were noted during this study- rare PAC. - Limb Movement total 236 (34.6/hr). Limb Movement with arousal/ awakening 12 (1.8/hr).  DIAGNOSIS - Obstructive Sleep Apnea (G47.33)  RECOMMENDATIONS - Treatment for minimal OSA is guided by symptoms and co-morbidity. Conservative measures may include observation, weight loss and sleep position off back. - Be careful with alcohol, sedatives and other CNS depressants that may worsen sleep apnea and disrupt normal sleep architecture. - Sleep hygiene should be reviewed to assess factors that may improve sleep quality. - Weight management and regular exercise should be initiated or continued if appropriate.  [Electronically signed] 07/28/2022 11:22 AM  Jetty Duhamel MD, ABSM Diplomate, American Board of Sleep Medicine NPI: [id number]                          Jetty Duhamel Diplomate, American Board of Sleep Medicine  ELECTRONICALLY SIGNED ON:  07/28/2022, 11:15 AM  [facility] PH: [phone]   FX: [phone] ACCREDITED BY THE AMERICAN ACADEMY OF SLEEP MEDICINE

## 2022-07-30 ENCOUNTER — Encounter: Payer: Self-pay | Admitting: Family Medicine

## 2022-08-02 ENCOUNTER — Telehealth: Payer: Self-pay

## 2022-08-02 DIAGNOSIS — G4733 Obstructive sleep apnea (adult) (pediatric): Secondary | ICD-10-CM

## 2022-08-02 NOTE — Telephone Encounter (Signed)
Mother calls nurse line requesting to speak with PCP about sleep study results.   Will forward to PCP.

## 2022-08-03 NOTE — Telephone Encounter (Signed)
Returned call to mother, reviewed findings of minimal OSA.  Discussed options for treatment including weight loss, avoiding sleeping on back, or we could try CPAP.  They prefer CPAP.  Order placed.  Routing to [facility] RN team to coordinate with home health agency.  Latrelle Dodrill, MD

## 2022-08-03 NOTE — Addendum Note (Signed)
Addended by: Latrelle Dodrill on: 08/03/2022 04:10 PM   Modules accepted: Orders

## 2022-08-06 ENCOUNTER — Encounter (HOSPITAL_COMMUNITY): Payer: Self-pay | Admitting: *Deleted

## 2022-08-06 ENCOUNTER — Ambulatory Visit (HOSPITAL_COMMUNITY)
Admission: EM | Admit: 2022-08-06 | Discharge: 2022-08-06 | Disposition: A | Discharge status: Home / Self Care | Payer: MEDICAID | Attending: Internal Medicine | Admitting: Internal Medicine

## 2022-08-06 ENCOUNTER — Other Ambulatory Visit: Payer: Self-pay

## 2022-08-06 DIAGNOSIS — M549 Dorsalgia, unspecified: Secondary | ICD-10-CM | POA: Diagnosis not present

## 2022-08-06 DIAGNOSIS — J4521 Mild intermittent asthma with (acute) exacerbation: Secondary | ICD-10-CM | POA: Diagnosis not present

## 2022-08-06 DIAGNOSIS — Z9109 Other allergy status, other than to drugs and biological substances: Secondary | ICD-10-CM

## 2022-08-06 LAB — POCT URINALYSIS DIP (MANUAL ENTRY)
Bilirubin, UA: NEGATIVE
Blood, UA: NEGATIVE
Glucose, UA: NEGATIVE mg/dL
Ketones, POC UA: NEGATIVE mg/dL
Leukocytes, UA: NEGATIVE
Nitrite, UA: NEGATIVE
Protein Ur, POC: NEGATIVE mg/dL
Spec Grav, UA: 1.01 (ref 1.010–1.025)
Urobilinogen, UA: 0.2 E.U./dL
pH, UA: 7 (ref 5.0–8.0)

## 2022-08-06 LAB — POCT URINE PREGNANCY: Preg Test, Ur: NEGATIVE

## 2022-08-06 MED ORDER — IBUPROFEN 600 MG PO TABS: 600.0000 mg | ORAL_TABLET | Freq: Four times a day (QID) | ORAL | 0 refills | Status: AC | PRN

## 2022-08-06 MED ORDER — CYCLOBENZAPRINE HCL 10 MG PO TABS: 10.0000 mg | ORAL_TABLET | Freq: Every evening | ORAL | 0 refills | Status: DC | PRN

## 2022-08-06 MED ORDER — FLUTICASONE PROPIONATE 50 MCG/ACT NA SUSP: NASAL | 2 refills | Status: DC

## 2022-08-06 MED ORDER — MONTELUKAST SODIUM 10 MG PO TABS: 10.0000 mg | ORAL_TABLET | Freq: Every day | ORAL | 0 refills | Status: DC

## 2022-08-06 MED ORDER — FLUTICASONE PROPIONATE HFA 44 MCG/ACT IN AERO: 2.0000 | INHALATION_SPRAY | Freq: Two times a day (BID) | RESPIRATORY_TRACT | 3 refills | Status: DC

## 2022-08-06 MED ORDER — ALBUTEROL SULFATE HFA 108 (90 BASE) MCG/ACT IN AERS: 2.0000 | INHALATION_SPRAY | RESPIRATORY_TRACT | 3 refills | Status: DC | PRN

## 2022-08-06 NOTE — Discharge Instructions (Addendum)
Please take medications as prescribed Avoid smoking cigarettes If you have worsening symptoms please return to urgent care to be reevaluated Heating pad use only 20 minutes on-20 minutes off cycle Take pain medications as prescribed Please do not drive or operate heavy machinery after taking muscle relaxants No indication for chest x-ray Return to urgent care if you have worsening symptoms.

## 2022-08-06 NOTE — ED Triage Notes (Signed)
Pt reports a cough for 2 weeks. Pt's Rt side pain started 3 days ago . Pt also wanted a Pregnancy test.

## 2022-08-06 NOTE — Telephone Encounter (Signed)
Community message sent to [employer]. Will await response.   Hannah C Pipkin, RN  
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
## 2022-08-07 NOTE — Telephone Encounter (Signed)
Receipt confirmed by [employer].   Hannah C Pipkin, RN  

## 2022-08-13 ENCOUNTER — Ambulatory Visit (INDEPENDENT_AMBULATORY_CARE_PROVIDER_SITE_OTHER): Payer: MEDICAID | Admitting: Family Medicine

## 2022-08-13 ENCOUNTER — Encounter: Payer: Self-pay | Admitting: Family Medicine

## 2022-08-13 ENCOUNTER — Other Ambulatory Visit: Payer: Self-pay | Admitting: Family Medicine

## 2022-08-13 VITALS — BP 125/91 | HR 88 | Wt 245.8 lb

## 2022-08-13 DIAGNOSIS — F4310 Post-traumatic stress disorder, unspecified: Secondary | ICD-10-CM

## 2022-08-13 DIAGNOSIS — I10 Essential (primary) hypertension: Secondary | ICD-10-CM

## 2022-08-13 DIAGNOSIS — F332 Major depressive disorder, recurrent severe without psychotic features: Secondary | ICD-10-CM

## 2022-08-13 DIAGNOSIS — J4540 Moderate persistent asthma, uncomplicated: Secondary | ICD-10-CM

## 2022-08-13 DIAGNOSIS — G40909 Epilepsy, unspecified, not intractable, without status epilepticus: Secondary | ICD-10-CM

## 2022-08-13 MED ORDER — HYDROCHLOROTHIAZIDE 12.5 MG PO CAPS: 12.5000 mg | ORAL_CAPSULE | Freq: Every day | ORAL | 0 refills | Status: DC

## 2022-08-13 NOTE — Progress Notes (Unsigned)
  Date of Visit: 08/13/2022   SUBJECTIVE:   HPI:  Lisa [name] presents today for follow-up.  Asthma: Previously had a bad cough, seen at urgent care, deemed to have an asthma exacerbation.  Treated with albuterol inhaler, Singulair, Flovent.  Overall improving since then.  Continues on Flovent as controller medication.  Mood: Has been following regularly with psychiatry.  Currently taking Depakote ER 500 mg daily, and Haldol decanoate 50 mg every 28 days.  Also takes trazodone 100 mg at bedtime.  Overall doing really well on this regimen.  Her mom accompanies her to today's visit and confirms she has been doing well.  Elevated blood pressures: Previously took hydrochlorothiazide but has been off of this for some time.  Reports recent elevated blood pressures at home.  Amenable to restarting HCTZ today.  Also of note, reports having headaches and numbness in her right hand and lower right leg whenever her blood pressure is elevated.  Also has some dizziness during those times.  Denies having any of these symptoms right now.  OBJECTIVE:   BP (!) 125/91   Pulse 88   Wt 245 lb 12.8 oz (111.5 kg)   LMP 08/13/2022   SpO2 98%   BMI 44.96 kg/m  Gen: No acute distress, pleasant, cooperative, HEENT: Normocephalic, atraumatic Heart: Regular rate and rhythm, no murmur Lungs: Clear to auscultation bilaterally, normal effort Neuro: Cranial nerves II through XII tested and intact.  Face symmetric.  Sensation intact over bilateral face.  Pupils equal round and reactive to light.  Extraocular movements intact.  Tongue protrudes midline.  SCM 5 out of 5 bilaterally, grip 5 out of 5 bilaterally, full strength bilateral lower extremities with ankle plantarflexion, dorsiflexion, knee extension and flexion, hip flexion.   ASSESSMENT/PLAN:   Hypertension Restart HCTZ 12.5 mg daily. Follow-up in 1 to 2 weeks to recheck blood pressure and check lab work.  Asthma Doing well, continue Flovent and  Singulair.  MDD (major depressive disorder), recurrent episode, severe (HCC) Doing well with current regimen managed by psychiatry.  She will follow-up in a week or 2 and we will draw labs as requested by psychiatry.  FOLLOW UP: Follow up in 2 weeks for hypertension and labs  [place] J. Pollie Meyer, MD [facility]

## 2022-08-13 NOTE — Patient Instructions (Signed)
It was great to see you again today.  Start hydrochlorothiazide 12.5mg  daily Follow up in 2 weeks for BP recheck and labs  Be well, Dr. Pollie Meyer

## 2022-08-15 ENCOUNTER — Encounter (HOSPITAL_COMMUNITY): Payer: Self-pay | Admitting: Emergency Medicine

## 2022-08-15 ENCOUNTER — Ambulatory Visit (HOSPITAL_COMMUNITY)
Admission: EM | Admit: 2022-08-15 | Discharge: 2022-08-15 | Disposition: A | Discharge status: Home / Self Care | Payer: MEDICAID | Attending: Family Medicine | Admitting: Family Medicine

## 2022-08-15 ENCOUNTER — Ambulatory Visit (INDEPENDENT_AMBULATORY_CARE_PROVIDER_SITE_OTHER): Payer: MEDICAID

## 2022-08-15 ENCOUNTER — Encounter (HOSPITAL_COMMUNITY): Payer: Self-pay

## 2022-08-15 VITALS — BP 144/100 | HR 82 | Ht 62.0 in | Wt 245.8 lb

## 2022-08-15 DIAGNOSIS — F319 Bipolar disorder, unspecified: Secondary | ICD-10-CM

## 2022-08-15 DIAGNOSIS — F411 Generalized anxiety disorder: Secondary | ICD-10-CM | POA: Diagnosis not present

## 2022-08-15 DIAGNOSIS — I10 Essential (primary) hypertension: Secondary | ICD-10-CM | POA: Diagnosis not present

## 2022-08-15 DIAGNOSIS — F200 Paranoid schizophrenia: Secondary | ICD-10-CM

## 2022-08-15 DIAGNOSIS — G4700 Insomnia, unspecified: Secondary | ICD-10-CM

## 2022-08-15 MED ORDER — BLOOD PRESSURE MONITOR DEVI: 0 refills | Status: AC

## 2022-08-15 NOTE — Assessment & Plan Note (Signed)
Doing well, continue Flovent and Singulair.

## 2022-08-15 NOTE — ED Triage Notes (Signed)
Pt had high blood pressure today. Pt just started on HTN meds yesterday and hasn't had today.  Pt has headache that started yesterday.

## 2022-08-15 NOTE — Assessment & Plan Note (Addendum)
Uncontrolled.  Restart HCTZ 12.5 mg daily. Follow-up in 1 to 2 weeks to recheck blood pressure and check lab work.

## 2022-08-15 NOTE — Progress Notes (Cosign Needed)
  Patient presents to the office for haldol 50 mg injection given by Lupita Leash , pt tolerated injection well in left Deltoid and  will return in 28 days , pt presented with her father. Pt BP was elevated and pt and her father was informed of the danger of hypertension.
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
## 2022-08-15 NOTE — Assessment & Plan Note (Signed)
Doing well with current regimen managed by psychiatry.  She will follow-up in a week or 2 and we will draw labs as requested by psychiatry.

## 2022-08-30 ENCOUNTER — Ambulatory Visit: Payer: MEDICAID | Admitting: Family Medicine

## 2022-09-11 ENCOUNTER — Ambulatory Visit (HOSPITAL_COMMUNITY): Payer: MEDICAID

## 2022-09-11 ENCOUNTER — Other Ambulatory Visit: Payer: Self-pay | Admitting: Family Medicine

## 2022-09-13 ENCOUNTER — Encounter (HOSPITAL_COMMUNITY): Payer: Self-pay

## 2022-09-13 ENCOUNTER — Ambulatory Visit (INDEPENDENT_AMBULATORY_CARE_PROVIDER_SITE_OTHER): Payer: MEDICAID

## 2022-09-13 ENCOUNTER — Ambulatory Visit (HOSPITAL_COMMUNITY): Payer: MEDICAID

## 2022-09-13 VITALS — BP 118/73 | HR 99 | Resp 18 | Ht 62.0 in | Wt 246.6 lb

## 2022-09-13 DIAGNOSIS — F200 Paranoid schizophrenia: Secondary | ICD-10-CM | POA: Diagnosis not present

## 2022-09-13 MED ORDER — HALOPERIDOL DECANOATE 50 MG/ML IM SOLN
50.0000 mg | Freq: Once | INTRAMUSCULAR | Status: AC
Start: 2022-09-13 — End: 2022-09-13
  Administered 2022-09-13: 50 mg via INTRAMUSCULAR

## 2022-09-13 NOTE — Progress Notes (Cosign Needed)
Patient arrived for injection of Haldol 50mg  that she brought with her from her pharmacy. Given in Right Deltoid without issues or complaints. States that her medication is working well. No side effects.

## 2022-09-21 ENCOUNTER — Encounter: Payer: Self-pay | Admitting: Pharmacist

## 2022-09-27 ENCOUNTER — Ambulatory Visit: Payer: MEDICAID | Admitting: Family Medicine

## 2022-10-02 ENCOUNTER — Telehealth (HOSPITAL_COMMUNITY): Payer: Self-pay | Admitting: Psychiatry

## 2022-10-03 ENCOUNTER — Other Ambulatory Visit (HOSPITAL_COMMUNITY): Payer: Self-pay | Admitting: Psychiatry

## 2022-10-03 DIAGNOSIS — F411 Generalized anxiety disorder: Secondary | ICD-10-CM

## 2022-10-03 DIAGNOSIS — F319 Bipolar disorder, unspecified: Secondary | ICD-10-CM

## 2022-10-03 MED ORDER — TRAZODONE HCL 100 MG PO TABS
100.0000 mg | ORAL_TABLET | Freq: Every day | ORAL | 3 refills | Status: DC
Start: 2022-10-03 — End: 2022-11-20

## 2022-10-03 NOTE — Telephone Encounter (Signed)
Medication refilled and sent to preferred pharmacy

## 2022-10-11 ENCOUNTER — Ambulatory Visit (HOSPITAL_COMMUNITY): Payer: MEDICAID

## 2022-10-11 ENCOUNTER — Ambulatory Visit (INDEPENDENT_AMBULATORY_CARE_PROVIDER_SITE_OTHER): Payer: MEDICAID | Admitting: Student

## 2022-10-11 ENCOUNTER — Encounter (HOSPITAL_COMMUNITY): Payer: Self-pay

## 2022-10-11 ENCOUNTER — Telehealth (HOSPITAL_COMMUNITY): Payer: Self-pay | Admitting: Psychiatry

## 2022-10-11 ENCOUNTER — Other Ambulatory Visit (HOSPITAL_COMMUNITY): Payer: Self-pay | Admitting: Psychiatry

## 2022-10-11 ENCOUNTER — Encounter (HOSPITAL_COMMUNITY): Payer: Self-pay | Admitting: Student

## 2022-10-11 ENCOUNTER — Emergency Department (HOSPITAL_COMMUNITY)
Admission: EM | Admit: 2022-10-11 | Discharge: 2022-10-11 | Disposition: A | Discharge status: Home / Self Care | Payer: MEDICAID | Attending: Emergency Medicine | Admitting: Emergency Medicine

## 2022-10-11 ENCOUNTER — Emergency Department (HOSPITAL_COMMUNITY): Payer: MEDICAID

## 2022-10-11 VITALS — BP 141/102 | HR 94 | Wt 250.0 lb

## 2022-10-11 DIAGNOSIS — F200 Paranoid schizophrenia: Secondary | ICD-10-CM

## 2022-10-11 DIAGNOSIS — F319 Bipolar disorder, unspecified: Secondary | ICD-10-CM

## 2022-10-11 DIAGNOSIS — F411 Generalized anxiety disorder: Secondary | ICD-10-CM

## 2022-10-11 DIAGNOSIS — F79 Unspecified intellectual disabilities: Secondary | ICD-10-CM | POA: Diagnosis not present

## 2022-10-11 DIAGNOSIS — R569 Unspecified convulsions: Secondary | ICD-10-CM | POA: Diagnosis present

## 2022-10-11 DIAGNOSIS — G4700 Insomnia, unspecified: Secondary | ICD-10-CM

## 2022-10-11 DIAGNOSIS — F902 Attention-deficit hyperactivity disorder, combined type: Secondary | ICD-10-CM | POA: Diagnosis not present

## 2022-10-11 LAB — CBC WITH DIFFERENTIAL/PLATELET
Abs Immature Granulocytes: 0.02 10*3/uL (ref 0.00–0.07)
Basophils Absolute: 0 10*3/uL (ref 0.0–0.1)
Basophils Relative: 0 %
Eosinophils Absolute: 0.2 10*3/uL (ref 0.0–0.5)
Eosinophils Relative: 3 %
HCT: 34.8 % — ABNORMAL LOW (ref 36.0–46.0)
Hemoglobin: 10.7 g/dL — ABNORMAL LOW (ref 12.0–15.0)
Immature Granulocytes: 0 %
Lymphocytes Relative: 37 %
Lymphs Abs: 2.5 10*3/uL (ref 0.7–4.0)
MCH: 23.4 pg — ABNORMAL LOW (ref 26.0–34.0)
MCHC: 30.7 g/dL (ref 30.0–36.0)
MCV: 76 fL — ABNORMAL LOW (ref 80.0–100.0)
Monocytes Absolute: 0.4 10*3/uL (ref 0.1–1.0)
Monocytes Relative: 6 %
Neutro Abs: 3.7 10*3/uL (ref 1.7–7.7)
Neutrophils Relative %: 54 %
Platelets: 282 10*3/uL (ref 150–400)
RBC: 4.58 MIL/uL (ref 3.87–5.11)
RDW: 14.6 % (ref 11.5–15.5)
WBC: 6.9 10*3/uL (ref 4.0–10.5)
nRBC: 0 % (ref 0.0–0.2)

## 2022-10-11 LAB — URINALYSIS, ROUTINE W REFLEX MICROSCOPIC
Bilirubin Urine: NEGATIVE
Glucose, UA: NEGATIVE mg/dL
Ketones, ur: NEGATIVE mg/dL
Nitrite: NEGATIVE
Protein, ur: NEGATIVE mg/dL
Specific Gravity, Urine: 1.018 (ref 1.005–1.030)
pH: 6 (ref 5.0–8.0)

## 2022-10-11 LAB — COMPREHENSIVE METABOLIC PANEL
ALT: 15 U/L (ref 0–44)
AST: 14 U/L — ABNORMAL LOW (ref 15–41)
Albumin: 3.2 g/dL — ABNORMAL LOW (ref 3.5–5.0)
Alkaline Phosphatase: 90 U/L (ref 38–126)
Anion gap: 14 (ref 5–15)
BUN: 10 mg/dL (ref 6–20)
CO2: 22 mmol/L (ref 22–32)
Calcium: 9.1 mg/dL (ref 8.9–10.3)
Chloride: 102 mmol/L (ref 98–111)
Creatinine, Ser: 0.85 mg/dL (ref 0.44–1.00)
GFR, Estimated: >60 mL/min (ref >60)
Glucose, Bld: 92 mg/dL (ref 70–99)
Potassium: 4 mmol/L (ref 3.5–5.1)
Sodium: 138 mmol/L (ref 135–145)
Total Bilirubin: 0.2 mg/dL — ABNORMAL LOW (ref 0.3–1.2)
Total Protein: 7 g/dL (ref 6.5–8.1)

## 2022-10-11 LAB — HCG, SERUM, QUALITATIVE: Preg, Serum: NEGATIVE

## 2022-10-11 LAB — PREGNANCY, URINE: Preg Test, Ur: NEGATIVE

## 2022-10-11 MED ORDER — HALOPERIDOL DECANOATE 100 MG/ML IM SOLN
50.0000 mg | INTRAMUSCULAR | Status: DC
  Administered 2022-10-11: 50 mg via INTRAMUSCULAR
  Filled 2022-10-11: qty 0.5
  Filled 2022-10-11: qty 1

## 2022-10-11 MED ORDER — METOCLOPRAMIDE HCL 5 MG/ML IJ SOLN: 10.0000 mg | Freq: Once | INTRAMUSCULAR | Status: DC

## 2022-10-11 MED ORDER — DIVALPROEX SODIUM ER 500 MG PO TB24
500.0000 mg | ORAL_TABLET | Freq: Two times a day (BID) | ORAL | 3 refills | Status: DC
Start: 2022-10-11 — End: 2022-11-20

## 2022-10-11 NOTE — ED Notes (Signed)
[name] is NOT officially legal guardian but acts as guardian - Lisa [name] is working to obtain formal guardianship.
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
## 2022-10-11 NOTE — ED Triage Notes (Signed)
Patient BIB [facility] from [facility] for seizure like activity. Patient was there to receive haldol shot and staff observed seizure like activity while the patient was waiting in a chair. Patient did not appear post-ictal with EMS, A&Ox4, no incontinence noted. VSS.

## 2022-10-11 NOTE — Discharge Instructions (Signed)
Your CT scan and lab work were reassuring.  I recommend you continue taking her medications and follow-up with neurology.  You should not drive or do any other dangerous activity due to risk for recurrent seizure until you follow-up with neurology.

## 2022-10-11 NOTE — Progress Notes (Cosign Needed)
Patient presents to the office for [facility] clinic  for injection of Haldol. After vital signs was taken she complained that she wasn't feeling well and felt light headed with blurred vision. This Clinical research associate notified Provider Park Pope , pt asked that EMS be called, nursing staff was informed by provider to call EMS for pt. Pt BP was 141/105 . Pt asked for water, nursing staff provided water to pt . Pt took two sips of water and stated to staff" Please don't leave me.'" BP obtained again with reading at 150/122 with pulse of 100. Nursing staff was present when pt started to have a seizure episode. Seizure  lasted approximately 10 seconds. Pt was unresponsive. Approximately  2 minutes later pt seized again and it lasted approximately 8 seconds . Pt had another episode within 1 minute. Staff was present during each episode to prevent injury to patient. Patient did not hit her head or cause any injury to herself. EMS was called a 2nd time due to seizure. Pt was able to remember her name and where she was after episodes. BP obtained again at 124/95. She continued to sit in the chair until EMS arrived and took patient to the hospital. Her step father was notified of the events and was told where she was going by EMS.  Attempted to contact her mother without success. Voicemail was full and no answer after multiple attempts. Haldol injection was not given but was wasted and documented.

## 2022-10-11 NOTE — Telephone Encounter (Signed)
Medication refilled and sent to preferred pharmacy

## 2022-10-11 NOTE — ED Notes (Signed)
Mother Marianna Fuss is NOT officially legal guardian but acts as guardian - Marianna Fuss is working to obtain formal guardianship.

## 2022-10-11 NOTE — ED Provider Notes (Signed)
[place] EMERGENCY DEPARTMENT AT [facility] Provider Note   CSN: [id number] Arrival date & time: 10/11/22  1530     History  Chief Complaint  Patient presents with   Seizures    Lisa [name] is a 27 y.o. female.   Seizures 27 year old female history of bipolar 1, seizures, prior nonepileptic seizures presenting for concern for seizure.  She states she went to behavioral health urgent care today for her Haldol long injectable shot.  She been doing well otherwise.  There she was reported to have observed brief episode of generalized shaking all extremities.  Reportedly was unresponsive but does not have postictal period after this.  She feels well now.  Never required medications.  She reports he has not had a seizure in several years.  She is not had any SI, HI, AVH.  Last had her Haldol shot about a month ago.  No recent medication changes.  She is not on any medicine for seizures as far she knows.  She does follow with neurology.  No fevers or chills.  No chest pain or shortness of breath or abdominal pain.  No headache or weakness or numbness or vision changes.  No urinary symptoms.     Home Medications Prior to Admission medications   Medication Sig Start Date End Date Taking? Authorizing Provider  acetaminophen (TYLENOL) 325 MG tablet Take 2 tablets (650 mg total) by mouth every 6 (six) hours as needed. 05/30/22   Sabino Dick, DO  albuterol (VENTOLIN HFA) 108 (90 Base) MCG/ACT inhaler Inhale 2 puffs into the lungs every 4 (four) hours as needed for wheezing or shortness of breath. 08/06/22   Lamptey, Britta Mccreedy, MD  Blood Pressure Monitor DEVI May substitute to any manufacturer covered by patient's insurance. 08/15/22   Mardella Layman, MD  divalproex (DEPAKOTE ER) 500 MG 24 hr tablet Take 1 tablet (500 mg total) by mouth 2 (two) times daily. 10/11/22   Toy Cookey E, NP  EPINEPHrine 0.3 mg/0.3 mL IJ SOAJ injection Inject 0.3 mg into the muscle once as needed  for up to 1 dose. 03/05/22   Latrelle Dodrill, MD  fluticasone (FLONASE) 50 MCG/ACT nasal spray PLACE 1 SPRAY INTO BOTH NOSTRILS DAILY. 08/06/22   Merrilee Jansky, MD  fluticasone (FLOVENT HFA) 44 MCG/ACT inhaler Inhale 2 puffs into the lungs in the morning and at bedtime. 08/06/22   Lamptey, Britta Mccreedy, MD  haloperidol decanoate (HALDOL DECANOATE) 50 MG/ML injection Inject 1 mL (50 mg total) into the muscle every 28 (twenty-eight) days. 07/10/22   Shanna Cisco, NP  hydrochlorothiazide (MICROZIDE) 12.5 MG capsule TAKE 1 CAPSULE BY MOUTH EVERY DAY 09/11/22   Latrelle Dodrill, MD  ibuprofen (ADVIL) 600 MG tablet Take 1 tablet (600 mg total) by mouth every 6 (six) hours as needed. 08/06/22   Lamptey, Britta Mccreedy, MD  montelukast (SINGULAIR) 10 MG tablet Take 1 tablet (10 mg total) by mouth at bedtime. 08/06/22   Lamptey, Britta Mccreedy, MD  ondansetron (ZOFRAN-ODT) 4 MG disintegrating tablet Take 1 tablet (4 mg total) by mouth every 8 (eight) hours as needed for nausea or vomiting. 05/30/22   Sabino Dick, DO  traZODone (DESYREL) 100 MG tablet Take 1 tablet (100 mg total) by mouth at bedtime. 10/03/22   Shanna Cisco, NP      Allergies    Amoxicillin, Contrast media [iodinated contrast media], Fish allergy, Percocet [oxycodone-acetaminophen], Atarax [hydroxyzine], and Hydrocodone-acetaminophen    Review of Systems   Review of  Systems  Neurological:  Positive for seizures.  Review of systems completed and notable as per HPI.  ROS otherwise negative.   Physical Exam Updated Vital Signs BP 124/82 (BP Location: Right Wrist)   Pulse 85   Temp 98.1 F (36.7 C) (Oral)   Resp 17   SpO2 100%  Physical Exam Vitals and nursing note reviewed.  Constitutional:      General: She is not in acute distress.    Appearance: She is well-developed.  HENT:     Head: Normocephalic and atraumatic.  Eyes:     Conjunctiva/sclera: Conjunctivae normal.  Cardiovascular:     Rate and Rhythm: Normal rate  and regular rhythm.     Pulses: Normal pulses.     Heart sounds: Normal heart sounds. No murmur heard. Pulmonary:     Effort: Pulmonary effort is normal. No respiratory distress.     Breath sounds: Normal breath sounds.  Abdominal:     Palpations: Abdomen is soft.     Tenderness: There is no abdominal tenderness.  Musculoskeletal:        General: No swelling.     Cervical back: Neck supple.  Skin:    General: Skin is warm and dry.     Capillary Refill: Capillary refill takes less than 2 seconds.  Neurological:     General: No focal deficit present.     Mental Status: She is alert and oriented to person, place, and time. Mental status is at baseline.     Cranial Nerves: No cranial nerve deficit.     Sensory: No sensory deficit.     Motor: No weakness.     Coordination: Coordination normal.     Gait: Gait normal.  Psychiatric:        Mood and Affect: Mood normal.     ED Results / Procedures / Treatments   Labs (all labs ordered are listed, but only abnormal results are displayed) Labs Reviewed  COMPREHENSIVE METABOLIC PANEL - Abnormal; Notable for the following components:      Result Value   Albumin 3.2 (*)    AST 14 (*)    Total Bilirubin 0.2 (*)    All other components within normal limits  CBC WITH DIFFERENTIAL/PLATELET - Abnormal; Notable for the following components:   Hemoglobin 10.7 (*)    HCT 34.8 (*)    MCV 76.0 (*)    MCH 23.4 (*)    All other components within normal limits  URINALYSIS, ROUTINE W REFLEX MICROSCOPIC - Abnormal; Notable for the following components:   APPearance HAZY (*)    Hgb urine dipstick MODERATE (*)    Leukocytes,Ua LARGE (*)    Bacteria, UA RARE (*)    All other components within normal limits  HCG, SERUM, QUALITATIVE  PREGNANCY, URINE    EKG None  Radiology CT Head Wo Contrast  Result Date: 10/11/2022 CLINICAL DATA:  Seizure EXAM: CT HEAD WITHOUT CONTRAST TECHNIQUE: Contiguous axial images were obtained from the base of the  skull through the vertex without intravenous contrast. RADIATION DOSE REDUCTION: This exam was performed according to the departmental dose-optimization program which includes automated exposure control, adjustment of the mA and/or kV according to patient size and/or use of iterative reconstruction technique. COMPARISON:  07/03/2022 FINDINGS: Brain: No acute intracranial abnormality. Specifically, no hemorrhage, hydrocephalus, mass lesion, acute infarction, or significant intracranial injury. Vascular: No hyperdense vessel or unexpected calcification. Skull: No acute calvarial abnormality. Sinuses/Orbits: No acute findings Other: None IMPRESSION: Normal study. Electronically Signed   By:  Charlett Nose M.D.   On: 10/11/2022 19:17    Procedures Procedures    Medications Ordered in ED Medications  metoCLOPramide (REGLAN) injection 10 mg (10 mg Intravenous Patient Refused/Not Given 10/11/22 1759)  haloperidol decanoate (HALDOL DECANOATE) 100 MG/ML injection 50 mg (50 mg Intramuscular Given 10/11/22 1827)    ED Course/ Medical Decision Making/ A&P                                 Medical Decision Making Amount and/or Complexity of Data Reviewed Labs: ordered. Radiology: ordered.  Risk Prescription drug management.   Medical Decision Making:   NANCYJO GIVHAN is a 27 y.o. female who presented to the ED today with concern for seizure-like activity.  Vital signs reviewed.  Exam she is well-appearing, normal neurologic exam.  Differential clued breakthrough seizure versus nonepileptic seizure.  She did have a mild headache earlier, not sudden onset, no meningismus or fever or signs of CNS infection.  However given this and seizure without history of recent seizure obtain CT head, lab workup.  CT head reviewed and unremarkable.  No signs of intracranial mass or other pathology.  CBC, CMP are grossly unremarkable with a mild anemia.  She is not pregnant.  Urinalysis appears contaminated, she is no  urinary symptoms will not treat for UTI.  I think she is stable.  She received her Haldol injection here.  I recommend she follow-up with her neurologist and psychiatrist.  I talked with her mom who is her guardian he was comfortable with this.  Stepfather is here to pick her up.  Discussed not driving or doing other dangerous activity due to risk for seizures.  Discharged in stable condition with return precautions.   Additional history discussed with patient's family/caregivers.  Patient placed on continuous vitals and telemetry monitoring while in ED which was reviewed periodically.  Reviewed and confirmed nursing documentation for past medical history, family history, social history.  Patient's presentation is most consistent with acute complicated illness / injury requiring diagnostic workup.           Final Clinical Impression(s) / ED Diagnoses Final diagnoses:  Seizure-like activity [facility])    Rx / DC Orders ED Discharge Orders     None         Laurence Spates, MD 10/11/22 [phone]
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
## 2022-10-15 ENCOUNTER — Encounter: Payer: Self-pay | Admitting: Family Medicine

## 2022-10-15 ENCOUNTER — Other Ambulatory Visit: Payer: Self-pay

## 2022-10-15 ENCOUNTER — Ambulatory Visit (INDEPENDENT_AMBULATORY_CARE_PROVIDER_SITE_OTHER): Payer: MEDICAID | Admitting: Family Medicine

## 2022-10-15 ENCOUNTER — Other Ambulatory Visit (HOSPITAL_COMMUNITY)
Admission: RE | Admit: 2022-10-15 | Discharge: 2022-10-15 | Disposition: A | Discharge status: Home / Self Care | Payer: MEDICAID | Source: Ambulatory Visit | Attending: Family Medicine | Admitting: Family Medicine

## 2022-10-15 VITALS — BP 133/91 | HR 91 | Ht 62.0 in | Wt 253.8 lb

## 2022-10-15 DIAGNOSIS — I10 Essential (primary) hypertension: Secondary | ICD-10-CM

## 2022-10-15 DIAGNOSIS — Z113 Encounter for screening for infections with a predominantly sexual mode of transmission: Secondary | ICD-10-CM | POA: Diagnosis present

## 2022-10-15 DIAGNOSIS — Z7251 High risk heterosexual behavior: Secondary | ICD-10-CM | POA: Diagnosis not present

## 2022-10-15 DIAGNOSIS — Z124 Encounter for screening for malignant neoplasm of cervix: Secondary | ICD-10-CM | POA: Insufficient documentation

## 2022-10-15 DIAGNOSIS — G40909 Epilepsy, unspecified, not intractable, without status epilepticus: Secondary | ICD-10-CM

## 2022-10-15 DIAGNOSIS — Z23 Encounter for immunization: Secondary | ICD-10-CM

## 2022-10-15 DIAGNOSIS — G4733 Obstructive sleep apnea (adult) (pediatric): Secondary | ICD-10-CM

## 2022-10-15 DIAGNOSIS — R87612 Low grade squamous intraepithelial lesion on cytologic smear of cervix (LGSIL): Secondary | ICD-10-CM

## 2022-10-15 DIAGNOSIS — R109 Unspecified abdominal pain: Secondary | ICD-10-CM

## 2022-10-15 DIAGNOSIS — Z0000 Encounter for general adult medical examination without abnormal findings: Secondary | ICD-10-CM | POA: Insufficient documentation

## 2022-10-15 MED ORDER — HYDROCHLOROTHIAZIDE 12.5 MG PO CAPS: 12.5000 mg | ORAL_CAPSULE | Freq: Every day | ORAL | 1 refills | Status: DC

## 2022-10-15 MED ORDER — ALBUTEROL SULFATE HFA 108 (90 BASE) MCG/ACT IN AERS: 2.0000 | INHALATION_SPRAY | RESPIRATORY_TRACT | 3 refills | Status: DC | PRN

## 2022-10-15 MED ORDER — DICYCLOMINE HCL 10 MG PO CAPS: 10.0000 mg | ORAL_CAPSULE | Freq: Three times a day (TID) | ORAL | 1 refills | Status: DC

## 2022-10-15 NOTE — Patient Instructions (Addendum)
It was great to see you again today.  Flu and COVID vaccines today  STI screening today Pap smear today  Refilled your albuterol Stay on hydrochlorothiazide, refilled this for you  Referring to neurologist.  Follow up with me in 1 month   Be well, Dr. Pollie Meyer
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
## 2022-10-16 LAB — CERVICOVAGINAL ANCILLARY ONLY
Chlamydia: POSITIVE — AB
Chlamydia: NEGATIVE
Chlamydia: POSITIVE — AB
Comment: NEGATIVE
Comment: NORMAL
Comment: NEGATIVE
Comment: NEGATIVE
Comment: NEGATIVE
Comment: NEGATIVE
Comment: NORMAL
Comment: NORMAL
Neisseria Gonorrhea: NEGATIVE
Neisseria Gonorrhea: NEGATIVE
Neisseria Gonorrhea: NEGATIVE
Trichomonas: NEGATIVE
Trichomonas: NEGATIVE

## 2022-10-16 LAB — HIV ANTIBODY (ROUTINE TESTING W REFLEX): HIV Screen 4th Generation wRfx: NONREACTIVE

## 2022-10-16 LAB — RPR: RPR Ser Ql: NONREACTIVE

## 2022-10-16 LAB — HEPATITIS B SURFACE ANTIGEN: Hepatitis B Surface Ag: NEGATIVE

## 2022-10-17 ENCOUNTER — Telehealth: Payer: Self-pay

## 2022-10-17 ENCOUNTER — Telehealth: Payer: Self-pay | Admitting: Family Medicine

## 2022-10-17 NOTE — Assessment & Plan Note (Signed)
BP mildly elevated but has been out of HCTZ for a couple of days.  Continue this medication, refilled.

## 2022-10-17 NOTE — Assessment & Plan Note (Signed)
Flu and COVID vaccines given today

## 2022-10-17 NOTE — Assessment & Plan Note (Signed)
Recently seen in ED for possible seizure versus PNES.  Has not seen neurology in a long time.  Referral placed.

## 2022-10-17 NOTE — Telephone Encounter (Signed)
Patient's social worker dropped off [place] forms to be completed. Last DOS was 10/15/22. Placed in [employer].
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
## 2022-10-17 NOTE — Telephone Encounter (Signed)
Patient calls nurse line requesting STD treatment.   She reports she viewed positive Chlamydia results on mychart.   Advised will send treatment to her pharmacy.   Will forward to PCP to send.   She confirmed [facility] on [address].

## 2022-10-17 NOTE — Assessment & Plan Note (Signed)
Full STI swabs obtained today (vaginal, rectal, pharyngeal) Also check HIV, RPR, hep B surface antigen

## 2022-10-17 NOTE — Assessment & Plan Note (Signed)
Compliant with CPAP, doing well.  Continue.

## 2022-10-18 MED ORDER — DOXYCYCLINE HYCLATE 100 MG PO TABS: 100.0000 mg | ORAL_TABLET | Freq: Two times a day (BID) | ORAL | 0 refills | Status: DC

## 2022-10-18 NOTE — Telephone Encounter (Signed)
Please inform patient rx has been sent in She should ensure her partner(s) have been treated as well, and both should refrain from intercourse until they have each been treated and 7 days have passed.  Thanks Dr. Pollie Meyer

## 2022-10-18 NOTE — Addendum Note (Signed)
Addended by: Theodoro Kos A on: 10/18/2022 02:46 PM   Modules accepted: Level of Service

## 2022-10-18 NOTE — Telephone Encounter (Signed)
Patient has been informed. Penni Bombard CMA

## 2022-10-18 NOTE — Telephone Encounter (Signed)
Placed in MDs box to be filled out. Mylan Schwarz, CMA  

## 2022-10-19 LAB — CYTOLOGY - PAP
Comment: NEGATIVE
Diagnosis: UNDETERMINED — AB
High risk HPV: POSITIVE — AB

## 2022-10-23 ENCOUNTER — Encounter (HOSPITAL_COMMUNITY): Payer: Self-pay

## 2022-10-23 ENCOUNTER — Ambulatory Visit (INDEPENDENT_AMBULATORY_CARE_PROVIDER_SITE_OTHER): Payer: MEDICAID

## 2022-10-23 VITALS — BP 135/86 | HR 77 | Ht 62.0 in | Wt 253.8 lb

## 2022-10-23 DIAGNOSIS — F319 Bipolar disorder, unspecified: Secondary | ICD-10-CM

## 2022-10-23 DIAGNOSIS — G4700 Insomnia, unspecified: Secondary | ICD-10-CM

## 2022-10-23 DIAGNOSIS — F411 Generalized anxiety disorder: Secondary | ICD-10-CM

## 2022-10-23 DIAGNOSIS — F200 Paranoid schizophrenia: Secondary | ICD-10-CM

## 2022-10-23 NOTE — Progress Notes (Cosign Needed)
   Patient presents to the office for haldol 50 mg injection given by Lupita Leash , pt tolerated injection well in left Deltoid and  will return in 28 days , pt presented with her mother. Pt BP was elevated and pt and

## 2022-10-24 NOTE — Telephone Encounter (Signed)
Late entry, form completed yesterday afternoon and placed in RN box  Latrelle Dodrill, MD

## 2022-10-24 NOTE — Telephone Encounter (Signed)
Attempted to call provider number for Okey Regal, Child psychotherapist, to advise of form completion.   She did not answer and I was unable to LVM. Will place at front desk for pick up.   Will attempt to reach out to [place] at later time.  Carol contact number: [phone].   Veronda Prude, RN

## 2022-10-25 NOTE — Telephone Encounter (Signed)
[employer]. She reports picking form up yesterday.   Veronda Prude, RN

## 2022-11-06 ENCOUNTER — Encounter: Payer: Self-pay | Admitting: Family Medicine

## 2022-11-09 NOTE — Telephone Encounter (Signed)
Patient had not read message so I contacted her via phone Mom answered, discussed need for colpo (mom functions as patient's guardian) Scheduled for next week with Dr. Cheri Kearns appreciative.  Latrelle Dodrill, MD

## 2022-11-15 ENCOUNTER — Ambulatory Visit (INDEPENDENT_AMBULATORY_CARE_PROVIDER_SITE_OTHER): Payer: MEDICAID

## 2022-11-15 ENCOUNTER — Other Ambulatory Visit: Payer: Self-pay | Admitting: Family Medicine

## 2022-11-15 VITALS — BP 81/51 | HR 80 | Wt 257.4 lb

## 2022-11-15 DIAGNOSIS — R87610 Atypical squamous cells of undetermined significance on cytologic smear of cervix (ASC-US): Secondary | ICD-10-CM

## 2022-11-15 DIAGNOSIS — R87810 Cervical high risk human papillomavirus (HPV) DNA test positive: Secondary | ICD-10-CM

## 2022-11-15 NOTE — Patient Instructions (Signed)
Colposcopy, Care After  The following information offers guidance on how to care for yourself after your procedure. Your doctor may also give you more specific instructions. If you have problems or questions, contact your doctor. What can I expect after the procedure? If you did not have a sample of your tissue taken out (did not have a biopsy), you may only have some spotting of blood for a few days. You can go back to your normal activities. If you had a sample of your tissue taken out, it is common to have: Soreness and mild pain. These may last for a few days. Mild bleeding or fluid (discharge) coming from your vagina. The fluid will look dark and grainy. You may have this for a few days. The fluid may be caused by a liquid that was used during your procedure. You may need to wear a sanitary pad. Spotting of blood for at least 48 hours after the procedure. Follow these instructions at home: Medicines Take over-the-counter and prescription medicines only as told by your doctor. Ask your doctor what over-the-counter pain medicines and prescription medicines you can start taking again. This is very important if you take blood thinners. Activity For at least 3 days, or for as long as told by your doctor, avoid: Douching. Using tampons. Having sex. Return to your normal activities as told by your doctor. Ask your doctor what activities are safe for you. General instructions Ask your doctor if you may take baths, swim, or use a hot tub. You may take showers. If you use birth control (contraception), keep using it. Keep all follow-up visits. Contact a doctor if: You have a fever or chills. You faint or feel light-headed. Get help right away if: You bleed a lot from your vagina. A lot of bleeding means that the bleeding soaks through a pad in less than 1 hour. You have clumps of blood (blood clots) coming from your vagina. You have signs that could mean you have an infection. This may be  fluid coming from your vagina that is: Different than normal. Yellow. Bad-smelling. You have very bad pain or cramps in your lower belly that do not get better with medicine. Summary If you did not have a sample of your tissue taken out, you may only have some spotting of blood for a few days. You can go back to your normal activities. If you had a sample of your tissue taken out, it is common to have mild pain for a few days and spotting for 48 hours. Avoid douching, using tampons, and having sex for at least 3 days after the procedure or for as long as told. Get help right away if you have a lot of bleeding, very bad pain, or signs of infection. This information is not intended to replace advice given to you by your health care provider. Make sure you discuss any questions you have with your health care provider. Document Revised: 05/22/2020 Document Reviewed: 05/22/2020 Elsevier Patient Education  2024 Elsevier Inc.  
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
## 2022-11-16 ENCOUNTER — Telehealth: Payer: Self-pay | Admitting: Family Medicine

## 2022-11-16 LAB — SURGICAL PATHOLOGY
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
## 2022-11-16 NOTE — Telephone Encounter (Signed)
Test result discussed with mom. She need repeat PAP in a year. Mom verbalized understanding.     1. Endocervix, curettage,  :       MUCOHEMORRHAGIC DEBRIS WITH SCANT BENIGN ENDOCERVICAL EPITHELIUM        2. Cervix, biopsy, 7 o'clock :       BENIGN SQUAMOUS MUCOSA       BENIGN ENDOCERVICAL EPITHELIUM       NEGATIVE FOR DYSPLASIA AND DIAGNOSTIC VIRAL CHANGE

## 2022-11-20 ENCOUNTER — Ambulatory Visit (INDEPENDENT_AMBULATORY_CARE_PROVIDER_SITE_OTHER): Payer: MEDICAID | Admitting: Student

## 2022-11-20 ENCOUNTER — Encounter (HOSPITAL_COMMUNITY): Payer: Self-pay | Admitting: Student

## 2022-11-20 DIAGNOSIS — F411 Generalized anxiety disorder: Secondary | ICD-10-CM | POA: Diagnosis not present

## 2022-11-20 DIAGNOSIS — F6381 Intermittent explosive disorder: Secondary | ICD-10-CM | POA: Insufficient documentation

## 2022-11-20 DIAGNOSIS — F639 Impulse disorder, unspecified: Secondary | ICD-10-CM | POA: Diagnosis not present

## 2022-11-20 DIAGNOSIS — F902 Attention-deficit hyperactivity disorder, combined type: Secondary | ICD-10-CM | POA: Diagnosis not present

## 2022-11-20 DIAGNOSIS — F4310 Post-traumatic stress disorder, unspecified: Secondary | ICD-10-CM

## 2022-11-20 DIAGNOSIS — F79 Unspecified intellectual disabilities: Secondary | ICD-10-CM

## 2022-11-20 MED ORDER — GABAPENTIN 300 MG PO CAPS
300.0000 mg | ORAL_CAPSULE | Freq: Three times a day (TID) | ORAL | 1 refills | Status: DC
Start: 2022-11-20 — End: 2023-01-15

## 2022-11-20 MED ORDER — MELATONIN 3 MG PO TABS
3.0000 mg | ORAL_TABLET | Freq: Every day | ORAL | Status: DC
Start: 2022-11-20 — End: 2022-12-17

## 2022-11-20 MED ORDER — DIVALPROEX SODIUM 500 MG PO DR TAB
500.0000 mg | DELAYED_RELEASE_TABLET | Freq: Two times a day (BID) | ORAL | 1 refills | Status: DC
Start: 2022-11-20 — End: 2023-01-15

## 2022-11-20 MED ORDER — TRAZODONE HCL 100 MG PO TABS
100.0000 mg | ORAL_TABLET | Freq: Every day | ORAL | 3 refills | Status: DC
Start: 2022-11-20 — End: 2023-01-15

## 2022-11-21 ENCOUNTER — Ambulatory Visit: Payer: MEDICAID | Admitting: Family Medicine

## 2022-11-22 ENCOUNTER — Encounter (HOSPITAL_COMMUNITY): Payer: Self-pay

## 2022-11-22 ENCOUNTER — Ambulatory Visit (HOSPITAL_COMMUNITY): Payer: MEDICAID

## 2022-11-22 VITALS — BP 141/96 | HR 91 | Ht 62.0 in | Wt 263.4 lb

## 2022-11-22 DIAGNOSIS — G4700 Insomnia, unspecified: Secondary | ICD-10-CM

## 2022-11-22 DIAGNOSIS — F411 Generalized anxiety disorder: Secondary | ICD-10-CM

## 2022-11-22 DIAGNOSIS — F200 Paranoid schizophrenia: Secondary | ICD-10-CM

## 2022-11-22 NOTE — Progress Notes (Cosign Needed)
Patient presents to the office for haldol 50 mg injection given by Lupita Leash , pt tolerated injection well in right Deltoid and  will return in 28 days , pt presented with her mother. Pt BP was elevated today due to her not taking her meds.

## 2022-12-11 ENCOUNTER — Encounter: Payer: Self-pay | Admitting: Neurology

## 2022-12-11 ENCOUNTER — Ambulatory Visit (INDEPENDENT_AMBULATORY_CARE_PROVIDER_SITE_OTHER): Payer: MEDICAID | Admitting: Neurology

## 2022-12-11 VITALS — BP 132/78 | HR 88 | Ht 62.0 in | Wt 266.0 lb

## 2022-12-11 DIAGNOSIS — R569 Unspecified convulsions: Secondary | ICD-10-CM | POA: Diagnosis not present

## 2022-12-11 DIAGNOSIS — S069X1A Unspecified intracranial injury with loss of consciousness of 30 minutes or less, initial encounter: Secondary | ICD-10-CM

## 2022-12-11 DIAGNOSIS — S069X0S Unspecified intracranial injury without loss of consciousness, sequela: Secondary | ICD-10-CM

## 2022-12-11 DIAGNOSIS — Z6841 Body Mass Index (BMI) 40.0 and over, adult: Secondary | ICD-10-CM

## 2022-12-11 DIAGNOSIS — F333 Major depressive disorder, recurrent, severe with psychotic symptoms: Secondary | ICD-10-CM
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
## 2022-12-17 ENCOUNTER — Encounter: Payer: Self-pay | Admitting: Family Medicine

## 2022-12-17 ENCOUNTER — Ambulatory Visit: Payer: MEDICAID | Admitting: Family Medicine

## 2022-12-17 VITALS — BP 130/97 | HR 87 | Ht 62.0 in | Wt 267.6 lb

## 2022-12-17 DIAGNOSIS — J4540 Moderate persistent asthma, uncomplicated: Secondary | ICD-10-CM | POA: Diagnosis not present

## 2022-12-17 DIAGNOSIS — I10 Essential (primary) hypertension: Secondary | ICD-10-CM | POA: Diagnosis not present

## 2022-12-17 DIAGNOSIS — F79 Unspecified intellectual disabilities: Secondary | ICD-10-CM

## 2022-12-17 DIAGNOSIS — M25472 Effusion, left ankle: Secondary | ICD-10-CM

## 2022-12-17 DIAGNOSIS — R109 Unspecified abdominal pain: Secondary | ICD-10-CM

## 2022-12-17 DIAGNOSIS — Z720 Tobacco use: Secondary | ICD-10-CM | POA: Diagnosis not present

## 2022-12-17 MED ORDER — NICOTINE 21 MG/24HR TD PT24: 21.0000 mg | MEDICATED_PATCH | Freq: Every day | TRANSDERMAL | 1 refills | Status: DC

## 2022-12-17 NOTE — Assessment & Plan Note (Signed)
Has gotten enrolled with [facility], hopefully this will be an excellent program for her.  Mood doing well with current regimen, continue following up with behavioral health.

## 2022-12-17 NOTE — Progress Notes (Signed)
  Date of Visit: 12/17/2022   SUBJECTIVE:   HPI:  Lisa [name] presents today for follow-up.  She is accompanied by her mother who helps provide some of the history.  Tobacco use: Currently smoking Black and milds about 10 cigarillos per day for the last 3 months.  Has noted increased coughing and asthma flareup since doing this.  Patient would like to stop and request assistance with nicotine replacement therapy.  Mood: Currently taking Haldol decanoate 50 mg IM every 28 days.  Tolerating this quite well, mom and patient both note that mood has been much improved on this regimen.  Also taking Depakote 500 mg twice daily and trazodone 100 mg at bedtime.  Making better choices, not leaving the house as often.  Sees behavioral medicine for all of this.  Abdominal pain: Noted at last visit, prescribed Bentyl which she has not actually picked up as her pharmacy was out of the medication.  Agreeable to restarting this.  Ankle pain/swelling: Brought up at the very end of the visit.  Reports recently rolling ankle.  Has had some tenderness and thinks it is a little bit swollen.  OBJECTIVE:   BP (!) 112/90   Pulse 87   Ht 5\' 2"  (1.575 m)   Wt 267 lb 9.6 oz (121.4 kg)   SpO2 97%   BMI 48.94 kg/m  Gen: No acute distress, pleasant, cooperative, well-appearing HEENT: Normocephalic, atraumatic Heart: Regular rate and rhythm, no murmur Lungs: Clear to auscultation bilaterally, normal effort Neuro: Grossly nonfocal, speech normal Ext: Left ankle without any appreciable edema.  No tenderness to palpation.  Some tenderness with passive dorsiflexion.  Full strength and normal movements of ankle (dorsiflexion, plantarflexion, inversion, eversion).  Sensation intact distally.  ASSESSMENT/PLAN:   Assessment & Plan Tobacco use Marked intake of nicotine with 10 black and milds cigarillos per day.  Counseled on behavioral techniques for quitting.  Start nicotine patch 21 mg per 24 hours.  Follow-up in 1  month to see how she is doing on this. Intellectual disability Has gotten enrolled with [facility], hopefully this will be an excellent program for her.  Mood doing well with current regimen, continue following up with behavioral health. Hypertension, unspecified type Blood pressure elevated today, suspect related to tobacco use.  We will recheck this in a month.  If this remains elevated then, consider increase of HCTZ. Moderate persistent asthma, unspecified whether complicated Remains on inhaler regimen.  Hopefully stopping tobacco use will help improve symptoms.  No wheezing heard on exam today. Left ankle swelling Brought up at very end of visit by patient.  No significant swelling appreciated on exam.  No signs of DVT or calf tenderness. Full range of motion.  Recommend trial of Ace wrap to help stabilize the ankle.  Follow-up if not improving. Abdominal pain, unspecified abdominal location Did not discuss in great detail today.  Encouraged picking up Bentyl, readdress at next visit if still bothering her.    FOLLOW UP: Follow up in 1 month for tobacco use and hypertension  [place] J. Pollie Meyer, MD [facility]

## 2022-12-17 NOTE — Patient Instructions (Signed)
It was great to see you again today.  Sent in nicotine patches for you Follow up in 1 month to recheck blood pressure and see how it's going backing off the black and milds Pick up the bentyl Can try ace wrap on your ankle  Be well, Dr. Pollie Meyer

## 2022-12-17 NOTE — Assessment & Plan Note (Signed)
Marked intake of nicotine with 10 black and milds cigarillos per day.  Counseled on behavioral techniques for quitting.  Start nicotine patch 21 mg per 24 hours.  Follow-up in 1 month to see how she is doing on this.

## 2022-12-17 NOTE — Assessment & Plan Note (Signed)
Blood pressure elevated today, suspect related to tobacco use.  We will recheck this in a month.  If this remains elevated then, consider increase of HCTZ.

## 2022-12-17 NOTE — Assessment & Plan Note (Signed)
Remains on inhaler regimen.  Hopefully stopping tobacco use will help improve symptoms.  No wheezing heard on exam today.

## 2022-12-20 ENCOUNTER — Ambulatory Visit (HOSPITAL_COMMUNITY): Payer: MEDICAID | Admitting: *Deleted

## 2022-12-20 ENCOUNTER — Telehealth (HOSPITAL_COMMUNITY): Payer: Self-pay | Admitting: *Deleted

## 2022-12-20 ENCOUNTER — Encounter (HOSPITAL_COMMUNITY): Payer: Self-pay

## 2022-12-20 VITALS — BP 123/94 | HR 87 | Ht 62.0 in | Wt 265.0 lb

## 2022-12-20 DIAGNOSIS — F319 Bipolar disorder, unspecified: Secondary | ICD-10-CM

## 2022-12-20 DIAGNOSIS — F333 Major depressive disorder, recurrent, severe with psychotic symptoms: Secondary | ICD-10-CM

## 2022-12-20 NOTE — Telephone Encounter (Signed)
Called mom to address concerns. Mom discusses neurologist's recommendations regarding use of depakote. At this time, mother is hesitant of switching her current psychotropic medication regimen due to her tolerating the medications well and they are currently appropriately managing her mood lability.  In addition, given patient's symptoms are more consistent with intermittent explosive disorder, Depakote may be a better medication to manage patient's lability. We did discuss the side effect profiles of depakote vs lamictal and I gave them the option for a slow cross taper from depakote to lamictal should mother and Liza want to make that switch but mother strongly prefers keeping with current psychotropic regiment with depakote DR and haldol LAI.

## 2022-12-20 NOTE — Progress Notes (Signed)
In accompanied by mom for her monthly inj of Haldol D 50 mg per 1 cc. She got her shot today in her L DELTOID without issue. Mom reports today when Clinical research associate commented about her cough that she is on the nicotine patch to stop smoking. [facility] [employer] and would like to work but her neurologist per her mom states she is not ready. Trying to get her seizure disorder under better control States her neurologist would like to speak with her psych provider here to d/c the depakote. Mom states she feels the depakote has been very helpful to her . States she signed a ROI at neurologist office to speak with provider here. Will forward this request to Dr Doyne Keel. No complaints voiced. Looking forward to Christmas. She is to return in 28 days for her next shot.

## 2022-12-20 NOTE — Telephone Encounter (Signed)
Mom in with patient today for her shot. Mom stated she was seen recently by her neurologist and would like to take her off the Depakote and speak with her psychiatric provider re this. I will forward this request to Dr Hazle Quant as he is her provider. Mom states she signed a ROI at the neurologist office to speak with [place]. Dr is Dr Porfirio Mylar Dohmeier.

## 2022-12-31 ENCOUNTER — Encounter (HOSPITAL_COMMUNITY): Payer: Self-pay

## 2022-12-31 ENCOUNTER — Other Ambulatory Visit: Payer: Self-pay

## 2022-12-31 ENCOUNTER — Emergency Department (HOSPITAL_COMMUNITY)
Admission: EM | Admit: 2022-12-31 | Discharge: 2023-01-01 | Disposition: A | Discharge status: Home / Self Care | Payer: MEDICAID | Attending: Student | Admitting: Student

## 2022-12-31 DIAGNOSIS — Z79899 Other long term (current) drug therapy: Secondary | ICD-10-CM | POA: Insufficient documentation

## 2022-12-31 DIAGNOSIS — Z7951 Long term (current) use of inhaled steroids: Secondary | ICD-10-CM | POA: Insufficient documentation

## 2022-12-31 DIAGNOSIS — R051 Acute cough: Secondary | ICD-10-CM | POA: Diagnosis not present

## 2022-12-31 DIAGNOSIS — R059 Cough, unspecified: Secondary | ICD-10-CM | POA: Diagnosis present

## 2022-12-31 DIAGNOSIS — I10 Essential (primary) hypertension: Secondary | ICD-10-CM | POA: Diagnosis not present

## 2022-12-31 DIAGNOSIS — Z20822 Contact with and (suspected) exposure to covid-19: Secondary | ICD-10-CM | POA: Diagnosis not present

## 2022-12-31 DIAGNOSIS — J45909 Unspecified asthma, uncomplicated: Secondary | ICD-10-CM | POA: Insufficient documentation

## 2022-12-31 MED ORDER — PREDNISONE 20 MG PO TABS
60.0000 mg | ORAL_TABLET | Freq: Once | ORAL | Status: AC
  Administered 2023-01-01: 60 mg via ORAL
  Filled 2022-12-31: qty 3

## 2022-12-31 MED ORDER — BENZONATATE 100 MG PO CAPS
200.0000 mg | ORAL_CAPSULE | Freq: Once | ORAL | Status: AC
  Administered 2023-01-01: 200 mg via ORAL
  Filled 2022-12-31: qty 2

## 2022-12-31 NOTE — ED Triage Notes (Signed)
Dry cough for a week, pt started smoking black and milds and this flared up her asthma per pt family member. Pt states she has been wheezing. No distress in triage.

## 2023-01-01 ENCOUNTER — Emergency Department (HOSPITAL_COMMUNITY): Payer: MEDICAID

## 2023-01-01 LAB — RESP PANEL BY RT-PCR (RSV, FLU A&B, COVID)  RVPGX2
Influenza A by PCR: NEGATIVE
Influenza B by PCR: NEGATIVE
Resp Syncytial Virus by PCR: NEGATIVE
SARS Coronavirus 2 by RT PCR: NEGATIVE

## 2023-01-01 MED ORDER — METHYLPREDNISOLONE 4 MG PO TBPK: ORAL_TABLET | ORAL | 0 refills | Status: DC

## 2023-01-01 MED ORDER — BENZONATATE 100 MG PO CAPS: 100.0000 mg | ORAL_CAPSULE | Freq: Three times a day (TID) | ORAL | 0 refills | Status: DC
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
## 2023-01-15 ENCOUNTER — Ambulatory Visit (INDEPENDENT_AMBULATORY_CARE_PROVIDER_SITE_OTHER): Payer: MEDICAID | Admitting: *Deleted

## 2023-01-15 ENCOUNTER — Ambulatory Visit (INDEPENDENT_AMBULATORY_CARE_PROVIDER_SITE_OTHER): Payer: MEDICAID | Admitting: Student

## 2023-01-15 VITALS — BP 130/88 | HR 94 | Resp 18 | Ht 62.0 in | Wt 255.2 lb

## 2023-01-15 DIAGNOSIS — F411 Generalized anxiety disorder: Secondary | ICD-10-CM

## 2023-01-15 DIAGNOSIS — F79 Unspecified intellectual disabilities: Secondary | ICD-10-CM | POA: Diagnosis not present

## 2023-01-15 DIAGNOSIS — F200 Paranoid schizophrenia: Secondary | ICD-10-CM

## 2023-01-15 DIAGNOSIS — F319 Bipolar disorder, unspecified: Secondary | ICD-10-CM

## 2023-01-15 DIAGNOSIS — F6381 Intermittent explosive disorder: Secondary | ICD-10-CM | POA: Diagnosis not present

## 2023-01-15 DIAGNOSIS — F17210 Nicotine dependence, cigarettes, uncomplicated: Secondary | ICD-10-CM

## 2023-01-15 DIAGNOSIS — F639 Impulse disorder, unspecified: Secondary | ICD-10-CM | POA: Diagnosis not present

## 2023-01-15 MED ORDER — DIVALPROEX SODIUM 500 MG PO DR TAB: 500.0000 mg | DELAYED_RELEASE_TABLET | Freq: Two times a day (BID) | ORAL | 1 refills | Status: DC

## 2023-01-15 MED ORDER — HALOPERIDOL DECANOATE 50 MG/ML IM SOLN
50.0000 mg | Freq: Once | INTRAMUSCULAR | Status: AC
  Administered 2023-01-15: 50 mg via INTRAMUSCULAR

## 2023-01-15 MED ORDER — HALOPERIDOL DECANOATE 100 MG/ML IM SOLN: 100.0000 mg | INTRAMUSCULAR | 5 refills | Status: DC

## 2023-01-15 MED ORDER — TRAZODONE HCL 100 MG PO TABS: 100.0000 mg | ORAL_TABLET | Freq: Every day | ORAL | 3 refills | Status: DC

## 2023-01-15 MED ORDER — NICOTINE 21 MG/24HR TD PT24: 21.0000 mg | MEDICATED_PATCH | Freq: Every day | TRANSDERMAL | 1 refills | Status: DC

## 2023-01-15 MED ORDER — GABAPENTIN 300 MG PO CAPS: 300.0000 mg | ORAL_CAPSULE | Freq: Three times a day (TID) | ORAL | 1 refills | Status: DC

## 2023-01-15 NOTE — Progress Notes (Cosign Needed)
 Patient arrived for injection of Haldol  50mg  that she supplied from her pharmacy. Dr. Lynnette saw patient before her injection and states she will increase to 100mg  at her next appointment. 50mg  given in Right Deltoid without issue or complaint. Patient is wearing a mask and her mother states she has a smokers cough and allergies but she was seen at her PCP to make sure she was not contagious. Denies SI/HI. Pleasant and cooperative.

## 2023-01-16 ENCOUNTER — Encounter: Payer: Self-pay | Admitting: Family Medicine

## 2023-01-17 ENCOUNTER — Other Ambulatory Visit: Payer: MEDICAID | Admitting: *Deleted

## 2023-01-17 ENCOUNTER — Telehealth: Payer: Self-pay | Admitting: Neurology

## 2023-01-17 ENCOUNTER — Ambulatory Visit: Payer: MEDICAID | Admitting: Family Medicine

## 2023-01-17 NOTE — Telephone Encounter (Signed)
 Pt's mother rescheduled appointment due to patient coughing, throwing up.

## 2023-01-21 ENCOUNTER — Other Ambulatory Visit (INDEPENDENT_AMBULATORY_CARE_PROVIDER_SITE_OTHER): Payer: MEDICAID

## 2023-01-21 DIAGNOSIS — Z79899 Other long term (current) drug therapy: Secondary | ICD-10-CM | POA: Diagnosis not present

## 2023-01-21 DIAGNOSIS — F6381 Intermittent explosive disorder: Secondary | ICD-10-CM

## 2023-01-23 LAB — VALPROIC ACID LEVEL: Valproic Acid Lvl: 40 ug/mL — ABNORMAL LOW (ref 50–100)

## 2023-02-11 ENCOUNTER — Ambulatory Visit (INDEPENDENT_AMBULATORY_CARE_PROVIDER_SITE_OTHER): Payer: MEDICAID | Admitting: Neurology

## 2023-02-11 DIAGNOSIS — R569 Unspecified convulsions: Secondary | ICD-10-CM

## 2023-02-11 DIAGNOSIS — F333 Major depressive disorder, recurrent, severe with psychotic symptoms: Secondary | ICD-10-CM

## 2023-02-11 DIAGNOSIS — S069X1A Unspecified intracranial injury with loss of consciousness of 30 minutes or less, initial encounter: Secondary | ICD-10-CM

## 2023-02-12 ENCOUNTER — Ambulatory Visit (INDEPENDENT_AMBULATORY_CARE_PROVIDER_SITE_OTHER): Payer: MEDICAID

## 2023-02-12 VITALS — BP 137/89 | HR 88 | Ht 62.0 in | Wt 267.4 lb

## 2023-02-12 DIAGNOSIS — F411 Generalized anxiety disorder: Secondary | ICD-10-CM

## 2023-02-12 DIAGNOSIS — F4310 Post-traumatic stress disorder, unspecified: Secondary | ICD-10-CM | POA: Diagnosis not present

## 2023-02-12 MED ORDER — HALOPERIDOL DECANOATE 100 MG/ML IM SOLN
100.0000 mg | Freq: Once | INTRAMUSCULAR | Status: AC
  Administered 2023-02-12: 100 mg via INTRAMUSCULAR

## 2023-02-12 NOTE — Progress Notes (Cosign Needed)
Pt presents today for injection of Haldol 100mg  1 ml. Tolerated injection well in left deltoid with no complaints. Patient is very sweet and kind and says she has quit smoking as a new years resolutions. Pt presets to be nice and groomed.

## 2023-02-20 ENCOUNTER — Encounter: Payer: Self-pay | Admitting: Neurology

## 2023-02-20 NOTE — Procedures (Signed)
   History:  28 year old woman with convulsion   EEG classification:  Awake and asleep  Duration: 25 minutes   Technical aspects: This EEG study was done with scalp electrodes positioned according to the 10-20 International system of electrode placement. Electrical activity was reviewed with band pass filter of 1-70Hz , sensitivity of 7 uV/mm, display speed of 3mm/sec with a 60Hz  notched filter applied as appropriate. EEG data were recorded continuously and digitally stored.   Description of the recording: The background rhythms of this recording consists of a fairly well modulated medium amplitude background activity of 10 Hz. As the record progresses, the patient initially is in the waking state, but appears to enter the early stage II sleep during the recording, with rudimentary sleep spindles and vertex sharp wave activity seen. During the wakeful state, photic stimulation was performed, and no abnormal responses were seen. Hyperventilation was also performed, no abnormal response seen. No epileptiform discharges seen during this recording. There was no focal slowing.   Abnormality: None   Impression: This is a normal awake and sleep EEG. No evidence of interictal epileptiform discharges. Normal EEGs, however, do not rule out epilepsy.    Windell Norfolk, MD [facility]

## 2023-02-21 ENCOUNTER — Encounter: Payer: Self-pay | Admitting: Neurology

## 2023-02-25 ENCOUNTER — Encounter: Payer: Self-pay | Admitting: Family Medicine

## 2023-02-25 ENCOUNTER — Ambulatory Visit (INDEPENDENT_AMBULATORY_CARE_PROVIDER_SITE_OTHER): Payer: MEDICAID | Admitting: Family Medicine

## 2023-02-25 VITALS — BP 116/88 | HR 93 | Ht 62.0 in | Wt 273.6 lb

## 2023-02-25 DIAGNOSIS — R519 Headache, unspecified: Secondary | ICD-10-CM | POA: Diagnosis not present

## 2023-02-25 DIAGNOSIS — R109 Unspecified abdominal pain: Secondary | ICD-10-CM | POA: Diagnosis not present

## 2023-02-25 NOTE — Patient Instructions (Signed)
It was great to see you again today.  Sending to GI doctor for the blood in your stool Checking labs today - kidneys, liver, blood counts Ordered ultrasound of your gallbladder  Try tylenol for headache Sending you to get your eyes checked in case that is contributing to headaches  Schedule follow up in a few weeks to see how you are doing  Be well, Dr. Pollie Meyer

## 2023-02-25 NOTE — Progress Notes (Unsigned)
  Date of Visit: 02/25/2023   SUBJECTIVE:   HPI:  Lisa [name] presents today for two issues.  Pain in side - trouble sleeping at night due to pain in her side. Gets hot, dizzy, throws up. Ongoing for a few weeks, getting worse. Happens on both sides, but especially on R. Radiates to back. Nothing seems to make worse or better. Tried taking ibuprofen but didn't help. Neita Carp makes it worse. Also worse with fried food. Normal bowel movements but admits to having blood in stool, last 1 week ago, had been going on for 3 weeks. Eating okay but says she is trying to lose weight. Has both diarrhea and normal stools.  Headaches - pain in back of head on R side and on R temple. Pain is 10/10. Began a week ago. Lasts 2-3 minutes at a time, occurring every other day. Ibuprofen helps for the headaches. Notices it begins when she is yelling.   Patient was unaccompanied to today's visit and has intellectual disability, history was therefore somewhat challenging to obtain.  OBJECTIVE:   BP 116/88   Pulse 93   Ht 5\' 2"  (1.575 m)   Wt 273 lb 9.6 oz (124.1 kg)   SpO2 100%   BMI 50.04 kg/m  Gen: No acute distress, pleasant, cooperative, well-appearing HEENT: Normocephalic, atraumatic Heart: Regular rate and rhythm, no murmur Lungs: Clear to auscultation bilaterally, normal effort Neuro: Grossly nonfocal, speech normal, pupils equal round reactive to light, face symmetric, strength 5 out of 5 in bilateral upper and lower extremities Abdomen: Soft, nontender to palpation, no masses or organomegaly, negative Murphy sign.  Mild tenderness of lateral aspect of ribs bilaterally Ext: No edema  ASSESSMENT/PLAN:   Assessment & Plan Right sided abdominal pain Etiology not fully clear.  Overall benign abdominal exam today.  Suspect may be musculoskeletal, but given location primarily in right upper quadrant and reported worsening with fatty foods, check ultrasound of gallbladder.  Also check labs today including  CMET and CBC with differential.  Given report of recurrent blood in stool, refer to GI. Nonintractable headache, unspecified chronicity pattern, unspecified headache type Again, etiology unclear.  Atypical to have a headache that lasted only 2 to 3 minutes at a time every other day for the last week.  Possibly some form of tension headache, especially as it begins with yelling.  Reports not having eyes checked in several years.  Refer to Ophtho to be sure does not need glasses.  Advised to use Tylenol rather than ibuprofen given her current abdominal issues and blood in stool.   FOLLOW UP: Follow up in few weeks for above issues Referring to GI and ophtho  [place] J. Pollie Meyer, MD [facility]

## 2023-02-26 ENCOUNTER — Encounter: Payer: Self-pay | Admitting: Family Medicine

## 2023-02-26 LAB — CBC WITH DIFFERENTIAL/PLATELET
Basophils Absolute: 0 10*3/uL (ref 0.0–0.2)
Basos: 1 %
EOS (ABSOLUTE): 0.3 10*3/uL (ref 0.0–0.4)
Eos: 5 %
Hematocrit: 36 % (ref 34.0–46.6)
Hemoglobin: 11.1 g/dL (ref 11.1–15.9)
Immature Grans (Abs): 0 10*3/uL (ref 0.0–0.1)
Immature Granulocytes: 0 %
Lymphocytes Absolute: 2.2 10*3/uL (ref 0.7–3.1)
Lymphs: 36 %
MCH: 22.8 pg — ABNORMAL LOW (ref 26.6–33.0)
MCHC: 30.8 g/dL — ABNORMAL LOW (ref 31.5–35.7)
MCV: 74 fL — ABNORMAL LOW (ref 79–97)
Monocytes Absolute: 0.5 10*3/uL (ref 0.1–0.9)
Monocytes: 8 %
Neutrophils Absolute: 3.3 10*3/uL (ref 1.4–7.0)
Neutrophils: 50 %
Platelets: 269 10*3/uL (ref 150–450)
RBC: 4.86 x10E6/uL (ref 3.77–5.28)
RDW: 15.4 % (ref 11.7–15.4)
WBC: 6.3 10*3/uL (ref 3.4–10.8)

## 2023-02-26 LAB — CMP14+EGFR
ALT: 6 [IU]/L (ref 0–32)
AST: 8 [IU]/L (ref 0–40)
Albumin: 3.7 g/dL — ABNORMAL LOW (ref 4.0–5.0)
Alkaline Phosphatase: 102 [IU]/L (ref 44–121)
BUN/Creatinine Ratio: 8 — ABNORMAL LOW (ref 9–23)
BUN: 7 mg/dL (ref 6–20)
Bilirubin Total: <0.2 mg/dL (ref 0.0–1.2)
CO2: 22 mmol/L (ref 20–29)
Calcium: 9.2 mg/dL (ref 8.7–10.2)
Chloride: 101 mmol/L (ref 96–106)
Creatinine, Ser: 0.83 mg/dL (ref 0.57–1.00)
Globulin, Total: 3 g/dL (ref 1.5–4.5)
Glucose: 100 mg/dL — ABNORMAL HIGH (ref 70–99)
Potassium: 3.6 mmol/L (ref 3.5–5.2)
Sodium: 137 mmol/L (ref 134–144)
Total Protein: 6.7 g/dL (ref 6.0–8.5)
eGFR: 99 mL/min/{1.73_m2} (ref >59)

## 2023-02-27 NOTE — Assessment & Plan Note (Signed)
Again, etiology unclear.  Atypical to have a headache that lasted only 2 to 3 minutes at a time every other day for the last week.  Possibly some form of tension headache, especially as it begins with yelling.  Reports not having eyes checked in several years.  Refer to Ophtho to be sure does not need glasses.  Advised to use Tylenol rather than ibuprofen given her current abdominal issues and blood in stool.

## 2023-03-05 ENCOUNTER — Ambulatory Visit (HOSPITAL_COMMUNITY)
Admission: RE | Admit: 2023-03-05 | Discharge: 2023-03-05 | Disposition: A | Discharge status: Home / Self Care | Payer: MEDICAID | Source: Ambulatory Visit | Attending: Family Medicine | Admitting: Family Medicine

## 2023-03-05 ENCOUNTER — Telehealth: Payer: Self-pay

## 2023-03-05 ENCOUNTER — Emergency Department (HOSPITAL_COMMUNITY)
Admission: EM | Admit: 2023-03-05 | Discharge: 2023-03-05 | Disposition: A | Discharge status: Home / Self Care | Payer: MEDICAID | Attending: Emergency Medicine | Admitting: Emergency Medicine

## 2023-03-05 ENCOUNTER — Telehealth (HOSPITAL_COMMUNITY): Payer: Self-pay | Admitting: *Deleted

## 2023-03-05 DIAGNOSIS — R1011 Right upper quadrant pain: Secondary | ICD-10-CM | POA: Diagnosis present

## 2023-03-05 DIAGNOSIS — K8020 Calculus of gallbladder without cholecystitis without obstruction: Secondary | ICD-10-CM | POA: Diagnosis not present

## 2023-03-05 DIAGNOSIS — X58XXXA Exposure to other specified factors, initial encounter: Secondary | ICD-10-CM | POA: Diagnosis not present

## 2023-03-05 DIAGNOSIS — R109 Unspecified abdominal pain: Secondary | ICD-10-CM | POA: Insufficient documentation

## 2023-03-05 DIAGNOSIS — S0990XA Unspecified injury of head, initial encounter: Secondary | ICD-10-CM | POA: Insufficient documentation

## 2023-03-05 LAB — CBC WITH DIFFERENTIAL/PLATELET
Abs Immature Granulocytes: 0.02 10*3/uL (ref 0.00–0.07)
Basophils Absolute: 0 10*3/uL (ref 0.0–0.1)
Basophils Relative: 0 %
Eosinophils Absolute: 0.2 10*3/uL (ref 0.0–0.5)
Eosinophils Relative: 2 %
HCT: 34.1 % — ABNORMAL LOW (ref 36.0–46.0)
Hemoglobin: 10.3 g/dL — ABNORMAL LOW (ref 12.0–15.0)
Immature Granulocytes: 0 %
Lymphocytes Relative: 35 %
Lymphs Abs: 2.7 10*3/uL (ref 0.7–4.0)
MCH: 23.7 pg — ABNORMAL LOW (ref 26.0–34.0)
MCHC: 30.2 g/dL (ref 30.0–36.0)
MCV: 78.6 fL — ABNORMAL LOW (ref 80.0–100.0)
Monocytes Absolute: 0.6 10*3/uL (ref 0.1–1.0)
Monocytes Relative: 8 %
Neutro Abs: 4.2 10*3/uL (ref 1.7–7.7)
Neutrophils Relative %: 55 %
Platelets: 262 10*3/uL (ref 150–400)
RBC: 4.34 MIL/uL (ref 3.87–5.11)
RDW: 15.2 % (ref 11.5–15.5)
WBC: 7.6 10*3/uL (ref 4.0–10.5)
nRBC: 0 % (ref 0.0–0.2)

## 2023-03-05 LAB — COMPREHENSIVE METABOLIC PANEL
ALT: 12 U/L (ref 0–44)
AST: 19 U/L (ref 15–41)
Albumin: 3.2 g/dL — ABNORMAL LOW (ref 3.5–5.0)
Alkaline Phosphatase: 85 U/L (ref 38–126)
Anion gap: 5 (ref 5–15)
BUN: 14 mg/dL (ref 6–20)
CO2: 27 mmol/L (ref 22–32)
Calcium: 8.8 mg/dL — ABNORMAL LOW (ref 8.9–10.3)
Chloride: 105 mmol/L (ref 98–111)
Creatinine, Ser: 0.97 mg/dL (ref 0.44–1.00)
GFR, Estimated: >60 mL/min (ref >60)
Glucose, Bld: 94 mg/dL (ref 70–99)
Potassium: 4.4 mmol/L (ref 3.5–5.1)
Sodium: 137 mmol/L (ref 135–145)
Total Bilirubin: 0.6 mg/dL (ref 0.0–1.2)
Total Protein: 7 g/dL (ref 6.5–8.1)

## 2023-03-05 LAB — LIPASE, BLOOD: Lipase: 28 U/L (ref 11–51)

## 2023-03-05 LAB — HCG, SERUM, QUALITATIVE: Preg, Serum: NEGATIVE

## 2023-03-05 MED ORDER — ONDANSETRON HCL 4 MG PO TABS: 4.0000 mg | ORAL_TABLET | Freq: Four times a day (QID) | ORAL | 0 refills | Status: DC

## 2023-03-05 MED ORDER — TRAMADOL HCL 50 MG PO TABS: 50.0000 mg | ORAL_TABLET | Freq: Four times a day (QID) | ORAL | 0 refills | Status: DC | PRN

## 2023-03-05 NOTE — Telephone Encounter (Signed)
 Advised radiology that we didn't see pt and that she needed to call number for that clinic.

## 2023-03-05 NOTE — ED Triage Notes (Signed)
 Pt arrives with mother who is legal guardian d/t hx of IDD. She reports that pt has been struggling with abd pain and N/V for two months. She had outpatient US done today which showed multiple gallstones and possible acute cholecystitis. Referred to ED for possible cholecystitis.

## 2023-03-05 NOTE — ED Provider Notes (Signed)
 [place] EMERGENCY DEPARTMENT AT [facility] Provider Note   CSN: [id number] Arrival date & time: 03/05/23  1618     History  Chief Complaint  Patient presents with   Abdominal Pain    Lisa [name] is a 28 y.o. female.  Patient is a 28 year old female with a past medical history of TBI, intellectual disability, seizure disorder presenting to the emergency department with abdominal pain.  Patient is here with her mother who reports that she has had right upper quadrant abdominal pain most daily for about the last month.  She states she has had associated nausea and vomiting.  She states that the pain most always happens after eating and will often wake her up at night.  She states that it is worse with spicy and fried foods.  She states that she saw her primary doctor who ordered an ultrasound today that was concerning for cholelithiasis and possible cholecystitis and she was referred to the ER.  She states that she has been taking ibuprofen for pain with minimal relief.  She states that she did have a low-grade fever about a week ago but has not had any fever since then.  The history is provided by the patient and a parent.  Abdominal Pain      Home Medications Prior to Admission medications   Medication Sig Start Date End Date Taking? Authorizing Provider  ondansetron (ZOFRAN) 4 MG tablet Take 1 tablet (4 mg total) by mouth every 6 (six) hours. 03/05/23  Yes Theresia Lo, Benetta Spar K, DO  traMADol (ULTRAM) 50 MG tablet Take 1 tablet (50 mg total) by mouth every 6 (six) hours as needed. 03/05/23  Yes Elayne Snare K, DO  acetaminophen (TYLENOL) 325 MG tablet Take 2 tablets (650 mg total) by mouth every 6 (six) hours as needed. 05/30/22   Sabino Dick, DO  albuterol (VENTOLIN HFA) 108 (90 Base) MCG/ACT inhaler Inhale 2 puffs into the lungs every 4 (four) hours as needed for wheezing or shortness of breath. 10/15/22   Latrelle Dodrill, MD  Blood Pressure Monitor  DEVI May substitute to any manufacturer covered by patient's insurance. 08/15/22   Mardella Layman, MD  divalproex (DEPAKOTE) 500 MG DR tablet Take 1 tablet (500 mg total) by mouth 2 (two) times daily. 01/15/23 03/16/23  Park Pope, MD  EPINEPHrine 0.3 mg/0.3 mL IJ SOAJ injection Inject 0.3 mg into the muscle once as needed for up to 1 dose. 03/05/22   Latrelle Dodrill, MD  fluticasone (FLONASE) 50 MCG/ACT nasal spray PLACE 1 SPRAY INTO BOTH NOSTRILS DAILY. 08/06/22   Merrilee Jansky, MD  fluticasone (FLOVENT HFA) 44 MCG/ACT inhaler Inhale 2 puffs into the lungs in the morning and at bedtime. 08/06/22   Lamptey, Britta Mccreedy, MD  gabapentin (NEURONTIN) 300 MG capsule Take 1 capsule (300 mg total) by mouth 3 (three) times daily. 01/15/23 03/16/23  Park Pope, MD  haloperidol decanoate (HALDOL DECANOATE) 100 MG/ML injection Inject 1 mL (100 mg total) into the muscle every 28 (twenty-eight) days. 01/15/23 07/14/23  Park Pope, MD  hydrochlorothiazide (MICROZIDE) 12.5 MG capsule Take 1 capsule (12.5 mg total) by mouth daily. 10/15/22   Latrelle Dodrill, MD  ibuprofen (ADVIL) 600 MG tablet Take 1 tablet (600 mg total) by mouth every 6 (six) hours as needed. 08/06/22   Lamptey, Britta Mccreedy, MD  montelukast (SINGULAIR) 10 MG tablet Take 1 tablet (10 mg total) by mouth at bedtime. 08/06/22   Lamptey, Britta Mccreedy, MD  nicotine (NICODERM  CQ - DOSED IN MG/24 HOURS) 21 mg/24hr patch Place 1 patch (21 mg total) onto the skin daily. 01/15/23   Park Pope, MD      Allergies    Amoxicillin, Contrast media [iodinated contrast media], Fish allergy, Percocet [oxycodone-acetaminophen], Atarax [hydroxyzine], and Hydrocodone-acetaminophen    Review of Systems   Review of Systems  Gastrointestinal:  Positive for abdominal pain.    Physical Exam Updated Vital Signs BP 120/84   Pulse 88   Temp 98 F (36.7 C) (Oral)   Resp 18   SpO2 100%  Physical Exam Vitals and nursing note reviewed.  Constitutional:      General: She is not in  acute distress.    Appearance: She is well-developed. She is obese.  HENT:     Head: Normocephalic and atraumatic.     Mouth/Throat:     Mouth: Mucous membranes are moist.  Eyes:     Extraocular Movements: Extraocular movements intact.  Cardiovascular:     Rate and Rhythm: Normal rate and regular rhythm.     Heart sounds: Normal heart sounds.  Pulmonary:     Effort: Pulmonary effort is normal.     Breath sounds: Normal breath sounds.  Abdominal:     General: Abdomen is flat.     Palpations: Abdomen is soft.     Tenderness: There is no abdominal tenderness.  Skin:    General: Skin is warm and dry.  Neurological:     General: No focal deficit present.     Mental Status: She is alert and oriented to person, place, and time.  Psychiatric:        Mood and Affect: Mood normal.        Behavior: Behavior normal.     ED Results / Procedures / Treatments   Labs (all labs ordered are listed, but only abnormal results are displayed) Labs Reviewed  COMPREHENSIVE METABOLIC PANEL - Abnormal; Notable for the following components:      Result Value   Calcium 8.8 (*)    Albumin 3.2 (*)    All other components within normal limits  CBC WITH DIFFERENTIAL/PLATELET - Abnormal; Notable for the following components:   Hemoglobin 10.3 (*)    HCT 34.1 (*)    MCV 78.6 (*)    MCH 23.7 (*)    All other components within normal limits  LIPASE, BLOOD  HCG, SERUM, QUALITATIVE    EKG None  Radiology US Abdomen Limited RUQ (LIVER/GB) Result Date: 03/05/2023 CLINICAL DATA:  28 year old female with RIGHT UPPER quadrant abdominal pain. EXAM: ULTRASOUND ABDOMEN LIMITED RIGHT UPPER QUADRANT COMPARISON:  01/28/2022 CT FINDINGS: Gallbladder: Multiple gallstones are noted. Gallbladder wall thickening is noted without sonographic Murphy sign. Equivocal pericholecystic fluid is noted. Common bile duct: Diameter: 3.9 mm. There is no evidence of intrahepatic or extrahepatic biliary dilatation. Liver:  Increased hepatic echogenicity noted. No focal abnormalities portal vein is patent on color Doppler imaging with normal direction of blood flow towards the liver. Other: None. IMPRESSION: 1. Cholelithiasis with gallbladder wall thickening but without sonographic Murphy sign-question acute cholecystitis. Correlate clinically. No biliary dilatation. 2. Hepatic steatosis. These results will be called to the ordering clinician or representative by the Radiologist Assistant, and communication documented in the PACS or [employer]. Electronically Signed   By: Harmon Pier M.D.   On: 03/05/2023 12:27   CT HEAD WO CONTRAST ( ) Result Date: 03/05/2023 CLINICAL DATA:  Head trauma, GCS=15, loss of consciousness (LOC) (Ped 0-17y) EXAM: CT HEAD WITHOUT CONTRAST TECHNIQUE:  Contiguous axial images were obtained from the base of the skull through the vertex without intravenous contrast. RADIATION DOSE REDUCTION: This exam was performed according to the departmental dose-optimization program which includes automated exposure control, adjustment of the mA and/or kV according to patient size and/or use of iterative reconstruction technique. COMPARISON:  Head CT 10/11/22 FINDINGS: Brain: No hemorrhage. No hydrocephalus. No extra-axial fluid collection. No mass effect. No mass lesion. No CT evidence of an acute cortical infarct Vascular: No hyperdense vessel or unexpected calcification. Skull: Normal. Negative for fracture or focal lesion. Sinuses/Orbits: No middle ear or mastoid effusion. Mild pansinus mucosal thickening. Orbits are unremarkable Other: None. IMPRESSION: No CT evidence of intracranial injury. Electronically Signed   By: Lorenza Cambridge M.D.   On: 03/05/2023 09:39    Procedures Procedures    Medications Ordered in ED Medications - No data to display  ED Course/ Medical Decision Making/ A&P Clinical Course as of 03/05/23 2019  Tue Mar 05, 2023  1903 Labs within normal range. Plan to consult general  surgery for symptomatic cholelithiasis vs cholecystitis. [VK]  1949 I spoke with Dr. Dwain Sarna of general surgery who states based on history, labs and current exam no concern for acute cholecystitis. Recommend treatment of symptomatic cholelithiasis and outpatient follow up. [VK]    Clinical Course User Index [VK] Rexford Maus, DO                                 Medical Decision Making This patient presents to the ED with chief complaint(s) of RUQ pain with pertinent past medical history of TBI, intellectual disability, seizure disorder which further complicates the presenting complaint. The complaint involves an extensive differential diagnosis and also carries with it a high risk of complications and morbidity.    The differential diagnosis includes cholelithiasis, cholecystitis, biliary obstruction, dehydration, electrolyte abnormality, pancreatitis, hepatitis  Additional history obtained: Additional history obtained from family Records reviewed Primary Care Documents  ED Course and Reassessment: On patient's arrival she is hemodynamically stable in no acute distress.  I did review her outpatient ultrasound that did show gallstones with gallbladder wall thickening, negative sonographic Murphy sign.  Patient will have labs performed here and will need surgery consultation for possible cholecystectomy.  She declines any pain or nausea control at this time and will be closely reassessed.  Independent labs interpretation:  The following labs were independently interpreted: within normal range  Independent visualization of imaging: - N/A  Consultation: - Consulted or discussed management/test interpretation w/ external professional: general surgery  Consideration for admission or further workup: Patient has no emergent conditions requiring admission or further work-up at this time and is stable for discharge home with general surgery follow-up  Social Determinants of health:  N/A    Amount and/or Complexity of Data Reviewed Labs: ordered.  Risk Prescription drug management.          Final Clinical Impression(s) / ED Diagnoses Final diagnoses:  Symptomatic cholelithiasis    Rx / DC Orders ED Discharge Orders          Ordered    ondansetron (ZOFRAN) 4 MG tablet  Every 6 hours        03/05/23 2017    traMADol (ULTRAM) 50 MG tablet  Every 6 hours PRN        03/05/23 2017              Rexford Maus, DO 03/05/23 2019

## 2023-03-05 NOTE — Telephone Encounter (Signed)
 Called patient's mother to speak with her about results.  Advised that ultrasound shows signs of possible acute cholecystitis.  Inquired about patient's current symptoms.  Mom reports that she has been crying due to pain, especially after eating.  Has been up many nights because of the pain.  Did have a fever on Saturday.  With that in mind, recommended she present to the emergency room as she may need surgery more urgently.  Mom understands and will take her there.  Latrelle Dodrill, MD

## 2023-03-05 NOTE — Telephone Encounter (Signed)
 Received call from [facility] regarding US Abdomen.   Please see the following results.   IMPRESSION: 1. Cholelithiasis with gallbladder wall thickening but without sonographic Murphy sign-question acute cholecystitis. Correlate clinically. No biliary dilatation. 2. Hepatic steatosis.  Forwarding to PCP.   Veronda Prude, RN

## 2023-03-05 NOTE — Discharge Instructions (Signed)
 You were seen in the emergency department for your abdominal pain.  You had no signs of a gallbladder infection today or any gallstone stuck in your gallbladder ducts.  Your pain is due to gallstones in your gallbladder and you should follow-up with general surgery in the office to schedule an outpatient surgery.  You can continue to take Tylenol and Motrin as needed for pain and I have given you tramadol for breakthrough pain.  This can make you drowsy so do not take it while driving, working or operating heavy machinery.  You can also take Zofran as needed for nausea.  You should try to avoid spicy, fried and greasy foods to avoid gallbladder attacks.  You should return to the emergency department if you develop fevers, significantly worsening pain, repetitive vomiting despite the nausea medicine or any other new or concerning symptoms.

## 2023-03-06 ENCOUNTER — Ambulatory Visit (INDEPENDENT_AMBULATORY_CARE_PROVIDER_SITE_OTHER): Payer: MEDICAID | Admitting: Student

## 2023-03-06 ENCOUNTER — Encounter: Payer: Self-pay | Admitting: Student

## 2023-03-06 VITALS — BP 132/80 | HR 94 | Temp 98.5°F | Ht 62.0 in | Wt 274.0 lb

## 2023-03-06 DIAGNOSIS — K8020 Calculus of gallbladder without cholecystitis without obstruction: Secondary | ICD-10-CM | POA: Insufficient documentation

## 2023-03-06 NOTE — Progress Notes (Signed)
  SUBJECTIVE:   CHIEF COMPLAINT / HPI:   ED follow up regarding cholelithiasis. Often when she eats,  She has appointment with [facility] tomorrow about cholecystectomy. She eats greasy foods often, she had pizza for breakfast.  She was unaware the pizza was classified as greasy food.  She is not having fevers but often has right upper quadrant pain with eating that can last up to an hour.  Presents with mother who aids as historian.  PERTINENT  PMH / PSH: HTN, morbid obesity, seizure disorder  OBJECTIVE:  BP 132/80   Pulse 94   Temp 98.5 F (36.9 C)   Ht 5\' 2"  (1.575 m)   Wt 274 lb (124.3 kg)   LMP 02/13/2023   SpO2 98%   BMI 50.12 kg/m  General: Well-appearing, NAD CV: RRR, no murmurs auscultated Abdomen: Soft, nontender, normoactive bowel sounds  ASSESSMENT/PLAN:   Assessment & Plan Calculus of gallbladder without cholecystitis without obstruction Reviewed ED note and imaging results.  She has appropriate follow-up with general surgery tomorrow.  Anticipate she will meet criteria for elective cholecystectomy.  No acute intervention required at this time.  I have provided education on dietary changes in addition to what classifies as greasy/fatty foods and recommendation of avoiding these. Return if symptoms worsen or fail to improve. Shelby Mattocks, DO 03/06/2023, 10:35 AM PGY-3, [facility]

## 2023-03-06 NOTE — Assessment & Plan Note (Signed)
 Reviewed ED note and imaging results.  She has appropriate follow-up with general surgery tomorrow.  Anticipate she will meet criteria for elective cholecystectomy.  No acute intervention required at this time.  I have provided education on dietary changes in addition to what classifies as greasy/fatty foods and recommendation of avoiding these.

## 2023-03-06 NOTE — Patient Instructions (Signed)
 It was great to see you today! Thank you for choosing [facility] for your primary care.  Today we addressed: I am glad you have follow-up with general surgery tomorrow.  I have attached a educational document on the Mediterranean diet.  If you haven't already, sign up for My Chart to have easy access to your labs results, and communication with your primary care physician.  Return if symptoms worsen or fail to improve. Please arrive 15 minutes before your appointment to ensure smooth check in process.  We appreciate your efforts in making this happen.  Thank you for allowing me to participate in your care, Shelby Mattocks, DO 03/06/2023, 10:30 AM PGY-3, [facility]

## 2023-03-07 ENCOUNTER — Encounter (HOSPITAL_COMMUNITY): Payer: Self-pay | Admitting: Student

## 2023-03-07 ENCOUNTER — Telehealth (HOSPITAL_COMMUNITY): Payer: MEDICAID | Admitting: Student

## 2023-03-07 DIAGNOSIS — F411 Generalized anxiety disorder: Secondary | ICD-10-CM | POA: Diagnosis not present

## 2023-03-07 DIAGNOSIS — F639 Impulse disorder, unspecified: Secondary | ICD-10-CM

## 2023-03-07 DIAGNOSIS — F6381 Intermittent explosive disorder: Secondary | ICD-10-CM

## 2023-03-07 MED ORDER — GABAPENTIN 300 MG PO CAPS
300.0000 mg | ORAL_CAPSULE | Freq: Three times a day (TID) | ORAL | 1 refills | Status: DC
Start: 2023-03-07 — End: 2023-08-29

## 2023-03-07 MED ORDER — CLONIDINE HCL 0.1 MG PO TABS: 0.1000 mg | ORAL_TABLET | Freq: Every day | ORAL | 1 refills | Status: DC

## 2023-03-07 MED ORDER — DIVALPROEX SODIUM 250 MG PO DR TAB: 250.0000 mg | DELAYED_RELEASE_TABLET | Freq: Two times a day (BID) | ORAL | 0 refills | Status: DC
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
## 2023-03-11 ENCOUNTER — Other Ambulatory Visit: Payer: Self-pay

## 2023-03-11 ENCOUNTER — Encounter: Payer: Self-pay | Admitting: Family Medicine

## 2023-03-11 ENCOUNTER — Ambulatory Visit: Payer: Self-pay | Admitting: General Surgery

## 2023-03-11 ENCOUNTER — Ambulatory Visit (INDEPENDENT_AMBULATORY_CARE_PROVIDER_SITE_OTHER): Payer: MEDICAID | Admitting: Family Medicine

## 2023-03-11 VITALS — BP 124/82 | HR 88 | Ht 62.0 in | Wt 270.6 lb

## 2023-03-11 DIAGNOSIS — K8020 Calculus of gallbladder without cholecystitis without obstruction: Secondary | ICD-10-CM | POA: Diagnosis not present

## 2023-03-11 DIAGNOSIS — Z5181 Encounter for therapeutic drug level monitoring: Secondary | ICD-10-CM

## 2023-03-11 NOTE — Progress Notes (Signed)
 Sent message, via epic in basket, requesting orders in epic from Careers adviser.

## 2023-03-11 NOTE — Progress Notes (Unsigned)
  Date of Visit: 03/11/2023   SUBJECTIVE:   HPI:  Lisa [name] presents today for routine follow up.  Biliary colic: Since last visit had right upper quadrant ultrasound which demonstrated gallstones.  Seen in ED, deemed not to have findings of acute cholecystitis.  Saw general surgery last week.  Has surgery scheduled on 3/18.  Has continued to have intermittent pain.  Had some pain last night, lingering into this morning.  No vomiting or fevers.  Pain is consistent with prior episodes.  Tolerating p.o. intake.  Medication monitoring: Received message from psychiatry team requesting we check labs including A1c, lipid panel, and an EKG today for monitoring while she is on antipsychotic therapy.  Patient is amenable to doing this today.  Has not eaten yet today, just had water.  OBJECTIVE:   BP 124/82   Pulse 88   Ht 5\' 2"  (1.575 m)   Wt 270 lb 9.6 oz (122.7 kg)   LMP 02/13/2023   SpO2 100%   BMI 49.49 kg/m  Gen: no acute distress, pleasant cooperative HEENT: normocephalic, atraumatic  Heart: regular rate and rhythm, no murmur Lungs: clear to auscultation bilaterally, normal work of breathing Abdomen: mild tenderness RUQ, no masses or organomegaly, no peritoneal signs, negative murphy Neuro: alert grossly nonfocal speech normal  ASSESSMENT/PLAN:   Assessment & Plan Encounter for medication monitoring Check lipids, A1c, EKG today for monitoring while on haldol Calculus of gallbladder without cholecystitis without obstruction     FOLLOW UP: Follow up in *** for ***  [place] J. Pollie Meyer, MD [facility]

## 2023-03-11 NOTE — Patient Instructions (Addendum)
 It was great to see you again today.  Checking labs today EKG looks good Good luck with your surgery!  Be well, Dr. Pollie Meyer

## 2023-03-12 ENCOUNTER — Ambulatory Visit (INDEPENDENT_AMBULATORY_CARE_PROVIDER_SITE_OTHER): Payer: MEDICAID

## 2023-03-12 ENCOUNTER — Other Ambulatory Visit (INDEPENDENT_AMBULATORY_CARE_PROVIDER_SITE_OTHER): Payer: MEDICAID

## 2023-03-12 VITALS — BP 120/85 | HR 81 | Ht 62.0 in | Wt 261.0 lb

## 2023-03-12 DIAGNOSIS — Z5181 Encounter for therapeutic drug level monitoring: Secondary | ICD-10-CM

## 2023-03-12 DIAGNOSIS — F200 Paranoid schizophrenia: Secondary | ICD-10-CM

## 2023-03-12 LAB — POCT GLYCOSYLATED HEMOGLOBIN (HGB A1C): Hemoglobin A1C: 5.3 % (ref 4.0–5.6)

## 2023-03-12 MED ORDER — HALOPERIDOL DECANOATE 100 MG/ML IM SOLN
100.0000 mg | Freq: Once | INTRAMUSCULAR | Status: AC
Start: 2023-03-12 — End: 2023-03-12
  Administered 2023-03-12: 100 mg via INTRAMUSCULAR

## 2023-03-12 NOTE — Progress Notes (Cosign Needed)
 Pt presents today for injection of Haldol 100 Mg/ ml. Pt tolerated injection well, with no complaints.    Pt presents to be well groomed and has a very sweet personality. She is very kind and is doing great on her medication. States that she will be having surgery this upcoming April and is nervous. Pt denies any AVH, SI, and HI.

## 2023-03-13 ENCOUNTER — Encounter: Payer: Self-pay | Admitting: Family Medicine

## 2023-03-13 LAB — LIPID PANEL
Chol/HDL Ratio: 3.6 ratio (ref 0.0–4.4)
Cholesterol, Total: 141 mg/dL (ref 100–199)
HDL: 39 mg/dL — ABNORMAL LOW (ref >39)
LDL Chol Calc (NIH): 83 mg/dL (ref 0–99)
Triglycerides: 103 mg/dL (ref 0–149)
VLDL Cholesterol Cal: 19 mg/dL (ref 5–40)

## 2023-03-13 NOTE — Assessment & Plan Note (Signed)
 Has upcoming surgery planned Reviewed ED precautions in the interim

## 2023-03-15 NOTE — Patient Instructions (Addendum)
 SURGICAL WAITING ROOM VISITATION  Patients having surgery or a procedure may have no more than 2 support people in the waiting area - these visitors may rotate.    Children under the age of 7 must have an adult with them who is not the patient.  Due to an increase in RSV and influenza rates and associated hospitalizations, children ages 67 and under may not visit patients in [facility] hospitals.  Visitors with respiratory illnesses are discouraged from visiting and should remain at home.  If the patient needs to stay at the hospital during part of their recovery, the visitor guidelines for inpatient rooms apply. Pre-op nurse will coordinate an appropriate time for 1 support person to accompany patient in pre-op.  This support person may not rotate.    Please refer to the [facility] website for the visitor guidelines for Inpatients (after your surgery is over and you are in a regular room).       Your procedure is scheduled on: 03/26/23   Report to [facility] Main Entrance    Report to admitting at 5:15 AM   Call this number if you have problems the morning of surgery [phone]   Do not eat food. :After Midnight.   After Midnight you may have the following liquids until 4:30 AM DAY OF SURGERY  Water Non-Citrus Juices (without pulp, NO RED-Apple, White grape, White cranberry) Black Coffee (NO MILK/CREAM OR CREAMERS, sugar ok)  Clear Tea (NO MILK/CREAM OR CREAMERS, sugar ok) regular and decaf                             Plain Jell-O (NO RED)                                           Fruit ices (not with fruit pulp, NO RED)                                     Popsicles (NO RED)                                                               Sports drinks like Gatorade (NO RED)                  FOLLOW BOWEL PREP AND ANY ADDITIONAL PRE OP INSTRUCTIONS YOU RECEIVED FROM YOUR SURGEON'S OFFICE!!!     Oral Hygiene is also important to reduce your risk of infection.                                     Remember - BRUSH YOUR TEETH THE MORNING OF SURGERY WITH YOUR REGULAR TOOTHPASTE   Do NOT smoke after Midnight   Stop all vitamins and herbal supplements 7 days before surgery.   Take these medicines the morning of surgery with A SIP OF WATER: Tylenol, Depakote, Gabapentin, Montelukast(Singulair),inhaler, nasal spray  Bring CPAP mask and tubing day of surgery.  You may not have any metal on your body including hair pins, jewelry, and body piercing             Do not wear make-up, lotions, powders, perfumes/cologne, or deodorant  Do not wear nail polish including gel and S&S, artificial/acrylic nails, or any other type of covering on natural nails including finger and toenails. If you have artificial nails, gel coating, etc. that needs to be removed by a nail salon please have this removed prior to surgery or surgery may need to be canceled/ delayed if the surgeon/ anesthesia feels like they are unable to be safely monitored.   Do not shave  48 hours prior to surgery.    Do not bring valuables to the hospital. [facility] IS NOT             RESPONSIBLE   FOR VALUABLES.   Contacts, glasses, dentures or bridgework may not be worn into surgery.  DO NOT BRING YOUR HOME MEDICATIONS TO THE HOSPITAL. PHARMACY WILL DISPENSE MEDICATIONS LISTED ON YOUR MEDICATION LIST TO YOU DURING YOUR ADMISSION IN THE HOSPITAL!    Patients discharged on the day of surgery will not be allowed to drive home.  Someone NEEDS to stay with you for the first 24 hours after anesthesia.   Special Instructions: Bring a copy of your healthcare power of attorney and living will documents the day of surgery if you haven't scanned them before.              Please read over the following fact sheets you were given: IF YOU HAVE QUESTIONS ABOUT YOUR PRE-OP INSTRUCTIONS PLEASE CALL [phone] [name] Bath   If you received a COVID test during your pre-op visit  it is  requested that you wear a mask when out in public, stay away from anyone that may not be feeling well and notify your surgeon if you develop symptoms. If you test positive for Covid or have been in contact with anyone that has tested positive in the last 10 days please notify you surgeon.     - Preparing for Surgery Before surgery, you can play an important role.  Because skin is not sterile, your skin needs to be as free of germs as possible.  You can reduce the number of germs on your skin by washing with CHG (chlorahexidine gluconate) soap before surgery.  CHG is an antiseptic cleaner which kills germs and bonds with the skin to continue killing germs even after washing. Please DO NOT use if you have an allergy to CHG or antibacterial soaps.  If your skin becomes reddened/irritated stop using the CHG and inform your nurse when you arrive at Short Stay. Do not shave (including legs and underarms) for at least 48 hours prior to the first CHG shower.  You may shave your face/neck.  Please follow these instructions carefully:  1.  Shower with CHG Soap the night before surgery and the  morning of surgery.  2.  If you choose to wash your hair, wash your hair first as usual with your normal  shampoo.  3.  After you shampoo, rinse your hair and body thoroughly to remove the shampoo.                             4.  Use CHG as you would any other liquid soap.  You can apply chg directly to the skin and wash.  Gently with a scrungie  or clean washcloth.  5.  Apply the CHG Soap to your body ONLY FROM THE NECK DOWN.   Do   not use on face/ open                           Wound or open sores. Avoid contact with eyes, ears mouth and   genitals (private parts).                       Wash face,  Genitals (private parts) with your normal soap.             6.  Wash thoroughly, paying special attention to the area where your    surgery  will be performed.  7.  Thoroughly rinse your body with warm water from  the neck down.  8.  DO NOT shower/wash with your normal soap after using and rinsing off the CHG Soap.                9.  Pat yourself dry with a clean towel.            10.  Wear clean pajamas.            11.  Place clean sheets on your bed the night of your first shower and do not  sleep with pets. Day of Surgery : Do not apply any lotions/deodorants the morning of surgery.  Please wear clean clothes to the hospital/surgery center.  FAILURE TO FOLLOW THESE INSTRUCTIONS MAY RESULT IN THE CANCELLATION OF YOUR SURGERY  PATIENT SIGNATURE_________________________________  NURSE SIGNATURE__________________________________  ________________________________________________________________________

## 2023-03-15 NOTE — Progress Notes (Addendum)
 COVID Vaccine received:  []  No [x]  Yes Date of any COVID positive Test in last 90 days: no PCP - Levert Feinstein MD Cardiologist - n/a  Chest x-ray - 01/01/23 Epic EKG  Stress Test -  ECHO - 07/15/21 Epic Cardiac Cath -   Bowel Prep - [x]  No  []   Yes ______  Pacemaker / ICD device [x]  No []  Yes   Spinal Cord Stimulator:[x]  No []  Yes       History of Sleep Apnea? []  No [x]  Yes   CPAP used?- []  No [x]  Yes    Does the patient monitor blood sugar?          [x]  No []  Yes  []  N/A  Patient has: [x]  NO Hx DM   []  Pre-DM                 []  DM1  []   DM2 Does patient have a [employer] or [employer]? []  No []  Yes   Fasting Blood Sugar Ranges-  Checks Blood Sugar _____ times a day  GLP1 agonist / usual dose - no GLP1 instructions:  SGLT-2 inhibitors / usual dose - no SGLT-2 instructions:   Blood Thinner / Instructions:no Aspirin Instructions:no  Comments:   Activity level: Patient is able  to climb a flight of stairs without difficulty; [x]  No CP  [x]  No SOB, __   Patient can  perform ADLs without assistance.   Anesthesia review: OSA Seizures, HTN  Patient denies shortness of breath, fever, cough and chest pain at PAT appointment.  Patient verbalized understanding and agreement to the Pre-Surgical Instructions that were given to them at this PAT appointment. Patient was also educated of the need to review these PAT instructions again prior to his/her surgery.I reviewed the appropriate phone numbers to call if they have any and questions or concerns.

## 2023-03-18 ENCOUNTER — Other Ambulatory Visit: Payer: Self-pay

## 2023-03-18 ENCOUNTER — Encounter (HOSPITAL_COMMUNITY): Payer: Self-pay

## 2023-03-18 ENCOUNTER — Encounter (HOSPITAL_COMMUNITY)
Admission: RE | Admit: 2023-03-18 | Discharge: 2023-03-18 | Disposition: A | Discharge status: Home / Self Care | Payer: MEDICAID | Source: Ambulatory Visit | Attending: General Surgery | Admitting: General Surgery

## 2023-03-18 VITALS — BP 137/97 | HR 81 | Temp 98.2°F | Resp 16 | Ht 62.0 in | Wt 263.0 lb

## 2023-03-18 DIAGNOSIS — I10 Essential (primary) hypertension: Secondary | ICD-10-CM | POA: Diagnosis not present

## 2023-03-18 DIAGNOSIS — Z01812 Encounter for preprocedural laboratory examination: Secondary | ICD-10-CM | POA: Insufficient documentation

## 2023-03-18 HISTORY — DX: Essential (primary) hypertension: I10

## 2023-03-18 HISTORY — DX: Family history of other specified conditions: Z84.89

## 2023-03-18 HISTORY — DX: Cardiac murmur, unspecified: R01.1

## 2023-03-18 HISTORY — DX: Dyspnea, unspecified: R06.00

## 2023-03-18 HISTORY — DX: Headache, unspecified: R51.9

## 2023-03-18 LAB — BASIC METABOLIC PANEL
Anion gap: 8 (ref 5–15)
BUN: 15 mg/dL (ref 6–20)
CO2: 25 mmol/L (ref 22–32)
Calcium: 9.2 mg/dL (ref 8.9–10.3)
Chloride: 103 mmol/L (ref 98–111)
Creatinine, Ser: 0.87 mg/dL (ref 0.44–1.00)
GFR, Estimated: >60 mL/min (ref >60)
Glucose, Bld: 85 mg/dL (ref 70–99)
Potassium: 4 mmol/L (ref 3.5–5.1)
Sodium: 136 mmol/L (ref 135–145)

## 2023-03-18 LAB — CBC
HCT: 35 % — ABNORMAL LOW (ref 36.0–46.0)
Hemoglobin: 10.8 g/dL — ABNORMAL LOW (ref 12.0–15.0)
MCH: 23.6 pg — ABNORMAL LOW (ref 26.0–34.0)
MCHC: 30.9 g/dL (ref 30.0–36.0)
MCV: 76.4 fL — ABNORMAL LOW (ref 80.0–100.0)
Platelets: 306 10*3/uL (ref 150–400)
RBC: 4.58 MIL/uL (ref 3.87–5.11)
RDW: 14.6 % (ref 11.5–15.5)
WBC: 5.7 10*3/uL (ref 4.0–10.5)
nRBC: 0 % (ref 0.0–0.2)

## 2023-03-19 ENCOUNTER — Encounter (HOSPITAL_COMMUNITY): Payer: MEDICAID | Admitting: Student

## 2023-03-25 NOTE — Anesthesia Preprocedure Evaluation (Signed)
 Anesthesia Evaluation  Patient identified by MRN, date of birth, ID band Patient awake    Reviewed: Allergy & Precautions, NPO status , Patient's Chart, lab work & pertinent test results  History of Anesthesia Complications (+) Family history of anesthesia reaction  Airway Mallampati: III  TM Distance: >3 FB Neck ROM: Full    Dental no notable dental hx. (+) Teeth Intact, Dental Advisory Given   Pulmonary shortness of breath and with exertion, asthma , sleep apnea and Continuous Positive Airway Pressure Ventilation , former smoker   Pulmonary exam normal breath sounds clear to auscultation       Cardiovascular hypertension, Pt. on medications Normal cardiovascular exam+ Valvular Problems/Murmurs  Rhythm:Regular Rate:Normal     Neuro/Psych  Headaches, Seizures -, Well Controlled,  PSYCHIATRIC DISORDERS Anxiety Depression Bipolar Disorder   Intellectual disability Mental retardation ADHDHx/o TBI     GI/Hepatic Neg liver ROS,GERD  ,,Cholelithiasis- symptomatic   Endo/Other    Class 3 obesityHyperlipidemia  Renal/GU negative Renal ROSLab Results      Component                Value               Date                      CREATININE               0.78                07/27/2021                BUN                      7                   07/27/2021                NA                       134 (L)             07/27/2021                K                        3.6                 07/27/2021                CL                       107                 07/27/2021                CO2                      20 (L)              07/27/2021             negative genitourinary   Musculoskeletal negative musculoskeletal ROS (+)    Abdominal  (+) + obese (BMI 47.5)  Peds  Hematology  (+) Blood dyscrasia, anemia Lab Results      Component  Value               Date                      WBC                      11.3 (H)             07/27/2021                HGB                      11.4 (L)            07/27/2021                HCT                      31.4 (L)            07/27/2021                MCV                      79.7 (L)            07/27/2021                PLT                      76 (L)              07/27/2021              Anesthesia Other Findings ALL: Amoxicillin, percocet-" Hives only"   Reproductive/Obstetrics negative OB ROS                             Anesthesia Physical Anesthesia Plan  ASA: 3  Anesthesia Plan: General   Post-op Pain Management: Dilaudid IV, Tylenol PO (pre-op)* and Celebrex PO (pre-op)*   Induction: Intravenous  PONV Risk Score and Plan: 3 and Treatment may vary due to age or medical condition, Midazolam, Ondansetron and Dexamethasone  Airway Management Planned: Oral ETT  Additional Equipment: None  Intra-op Plan:   Post-operative Plan: Extubation in OR  Informed Consent: I have reviewed the patients History and Physical, chart, labs and discussed the procedure including the risks, benefits and alternatives for the proposed anesthesia with the patient or authorized representative who has indicated his/her understanding and acceptance.     Dental advisory given  Plan Discussed with: CRNA and Anesthesiologist  Anesthesia Plan Comments:         Anesthesia Quick Evaluation

## 2023-03-26 ENCOUNTER — Ambulatory Visit (HOSPITAL_COMMUNITY)
Admission: RE | Admit: 2023-03-26 | Discharge: 2023-03-26 | Disposition: A | Discharge status: Home / Self Care | Payer: MEDICAID | Attending: General Surgery | Admitting: General Surgery

## 2023-03-26 ENCOUNTER — Encounter (HOSPITAL_COMMUNITY): Payer: Self-pay | Admitting: General Surgery

## 2023-03-26 ENCOUNTER — Ambulatory Visit (HOSPITAL_BASED_OUTPATIENT_CLINIC_OR_DEPARTMENT_OTHER): Payer: MEDICAID | Admitting: Anesthesiology

## 2023-03-26 ENCOUNTER — Ambulatory Visit (HOSPITAL_COMMUNITY): Payer: MEDICAID | Admitting: Physician Assistant

## 2023-03-26 ENCOUNTER — Encounter (HOSPITAL_COMMUNITY)
Admission: RE | Disposition: A | Discharge status: Home / Self Care | Payer: Self-pay | Source: Home / Self Care | Attending: General Surgery

## 2023-03-26 ENCOUNTER — Other Ambulatory Visit: Payer: Self-pay

## 2023-03-26 DIAGNOSIS — F319 Bipolar disorder, unspecified: Secondary | ICD-10-CM | POA: Insufficient documentation

## 2023-03-26 DIAGNOSIS — D649 Anemia, unspecified: Secondary | ICD-10-CM | POA: Insufficient documentation

## 2023-03-26 DIAGNOSIS — J45909 Unspecified asthma, uncomplicated: Secondary | ICD-10-CM

## 2023-03-26 DIAGNOSIS — F909 Attention-deficit hyperactivity disorder, unspecified type: Secondary | ICD-10-CM | POA: Diagnosis not present

## 2023-03-26 DIAGNOSIS — K8020 Calculus of gallbladder without cholecystitis without obstruction: Secondary | ICD-10-CM

## 2023-03-26 DIAGNOSIS — Z87891 Personal history of nicotine dependence: Secondary | ICD-10-CM | POA: Insufficient documentation

## 2023-03-26 DIAGNOSIS — G4730 Sleep apnea, unspecified: Secondary | ICD-10-CM | POA: Diagnosis not present

## 2023-03-26 DIAGNOSIS — K8010 Calculus of gallbladder with chronic cholecystitis without obstruction: Secondary | ICD-10-CM | POA: Insufficient documentation

## 2023-03-26 DIAGNOSIS — K828 Other specified diseases of gallbladder: Secondary | ICD-10-CM | POA: Diagnosis not present

## 2023-03-26 DIAGNOSIS — F418 Other specified anxiety disorders: Secondary | ICD-10-CM | POA: Diagnosis not present

## 2023-03-26 DIAGNOSIS — I10 Essential (primary) hypertension: Secondary | ICD-10-CM

## 2023-03-26 DIAGNOSIS — R0602 Shortness of breath: Secondary | ICD-10-CM | POA: Insufficient documentation

## 2023-03-26 DIAGNOSIS — R519 Headache, unspecified: Secondary | ICD-10-CM | POA: Diagnosis not present

## 2023-03-26 DIAGNOSIS — R569 Unspecified convulsions: Secondary | ICD-10-CM | POA: Diagnosis not present

## 2023-03-26 DIAGNOSIS — E669 Obesity, unspecified: Secondary | ICD-10-CM | POA: Insufficient documentation

## 2023-03-26 DIAGNOSIS — F419 Anxiety disorder, unspecified: Secondary | ICD-10-CM | POA: Diagnosis not present

## 2023-03-26 DIAGNOSIS — K219 Gastro-esophageal reflux disease without esophagitis: Secondary | ICD-10-CM | POA: Diagnosis not present

## 2023-03-26 DIAGNOSIS — F79 Unspecified intellectual disabilities: Secondary | ICD-10-CM | POA: Insufficient documentation

## 2023-03-26 DIAGNOSIS — Z6841 Body Mass Index (BMI) 40.0 and over, adult: Secondary | ICD-10-CM | POA: Insufficient documentation

## 2023-03-26 HISTORY — PX: CHOLECYSTECTOMY: SHX55

## 2023-03-26 LAB — POCT PREGNANCY, URINE: Preg Test, Ur: NEGATIVE

## 2023-03-26 SURGERY — LAPAROSCOPIC CHOLECYSTECTOMY
Anesthesia: General

## 2023-03-26 MED ORDER — OXYCODONE HCL 5 MG/5ML PO SOLN: 5.0000 mg | Freq: Once | ORAL | Status: AC | PRN

## 2023-03-26 MED ORDER — MIDAZOLAM HCL 2 MG/2ML IJ SOLN
INTRAMUSCULAR | Status: AC
  Filled 2023-03-26: qty 2

## 2023-03-26 MED ORDER — DEXAMETHASONE SODIUM PHOSPHATE 10 MG/ML IJ SOLN
INTRAMUSCULAR | Status: DC | PRN
  Administered 2023-03-26: 5 mg via INTRAVENOUS

## 2023-03-26 MED ORDER — ORAL CARE MOUTH RINSE: 15.0000 mL | Freq: Once | OROMUCOSAL | Status: AC

## 2023-03-26 MED ORDER — FENTANYL CITRATE (PF) 100 MCG/2ML IJ SOLN
INTRAMUSCULAR | Status: DC | PRN
  Administered 2023-03-26: 100 ug via INTRAVENOUS
  Administered 2023-03-26 (×2): 50 ug via INTRAVENOUS

## 2023-03-26 MED ORDER — KETOROLAC TROMETHAMINE 30 MG/ML IJ SOLN
INTRAMUSCULAR | Status: AC
  Filled 2023-03-26: qty 1

## 2023-03-26 MED ORDER — OXYCODONE HCL 5 MG PO TABS
5.0000 mg | ORAL_TABLET | Freq: Once | ORAL | Status: AC | PRN
  Administered 2023-03-26: 5 mg via ORAL

## 2023-03-26 MED ORDER — PROPOFOL 10 MG/ML IV BOLUS
INTRAVENOUS | Status: AC
  Filled 2023-03-26: qty 20

## 2023-03-26 MED ORDER — LIDOCAINE HCL (CARDIAC) PF 100 MG/5ML IV SOSY
PREFILLED_SYRINGE | INTRAVENOUS | Status: DC | PRN
  Administered 2023-03-26: 100 mg via INTRAVENOUS

## 2023-03-26 MED ORDER — GLYCOPYRROLATE 0.2 MG/ML IJ SOLN
INTRAMUSCULAR | Status: DC | PRN
  Administered 2023-03-26: .1 mg via INTRAVENOUS

## 2023-03-26 MED ORDER — ROCURONIUM BROMIDE 100 MG/10ML IV SOLN
INTRAVENOUS | Status: DC | PRN
  Administered 2023-03-26: 70 mg via INTRAVENOUS

## 2023-03-26 MED ORDER — SPY AGENT GREEN - (INDOCYANINE FOR INJECTION)
1.2500 mg | Freq: Once | INTRAMUSCULAR | Status: AC
  Administered 2023-03-26: 1.25 mg via INTRAVENOUS
  Filled 2023-03-26: qty 10

## 2023-03-26 MED ORDER — LACTATED RINGERS IV SOLN: INTRAVENOUS | Status: DC

## 2023-03-26 MED ORDER — CELECOXIB 200 MG PO CAPS
200.0000 mg | ORAL_CAPSULE | Freq: Once | ORAL | Status: AC
  Administered 2023-03-26: 200 mg via ORAL
  Filled 2023-03-26: qty 1

## 2023-03-26 MED ORDER — FENTANYL CITRATE PF 50 MCG/ML IJ SOSY
PREFILLED_SYRINGE | INTRAMUSCULAR | Status: AC
  Filled 2023-03-26: qty 1

## 2023-03-26 MED ORDER — SUGAMMADEX SODIUM 200 MG/2ML IV SOLN
INTRAVENOUS | Status: DC | PRN
  Administered 2023-03-26: 400 mg via INTRAVENOUS

## 2023-03-26 MED ORDER — LACTATED RINGERS IV SOLN
INTRAVENOUS | Status: DC | PRN
  Administered 2023-03-26: 1000 mL

## 2023-03-26 MED ORDER — CHLORHEXIDINE GLUCONATE CLOTH 2 % EX PADS: 6.0000 | MEDICATED_PAD | Freq: Once | CUTANEOUS | Status: DC

## 2023-03-26 MED ORDER — ACETAMINOPHEN 325 MG PO TABS: 325.0000 mg | ORAL_TABLET | ORAL | Status: DC | PRN

## 2023-03-26 MED ORDER — ACETAMINOPHEN 500 MG PO TABS
1000.0000 mg | ORAL_TABLET | ORAL | Status: AC
  Administered 2023-03-26: 1000 mg via ORAL
  Filled 2023-03-26: qty 2

## 2023-03-26 MED ORDER — ONDANSETRON HCL 4 MG/2ML IJ SOLN
INTRAMUSCULAR | Status: DC | PRN
  Administered 2023-03-26: 4 mg via INTRAVENOUS

## 2023-03-26 MED ORDER — BUPIVACAINE-EPINEPHRINE (PF) 0.25% -1:200000 IJ SOLN
INTRAMUSCULAR | Status: AC
  Filled 2023-03-26: qty 30

## 2023-03-26 MED ORDER — FENTANYL CITRATE (PF) 250 MCG/5ML IJ SOLN
INTRAMUSCULAR | Status: AC
  Filled 2023-03-26: qty 5

## 2023-03-26 MED ORDER — LACTATED RINGERS IV SOLN
INTRAVENOUS | Status: DC | PRN
Start: 2023-03-26 — End: 2023-03-26

## 2023-03-26 MED ORDER — DEXMEDETOMIDINE HCL IN NACL 80 MCG/20ML IV SOLN
INTRAVENOUS | Status: DC | PRN
Start: 2023-03-26 — End: 2023-03-26
  Administered 2023-03-26: 8 ug via INTRAVENOUS

## 2023-03-26 MED ORDER — PROPOFOL 10 MG/ML IV BOLUS
INTRAVENOUS | Status: DC | PRN
  Administered 2023-03-26: 200 mg via INTRAVENOUS

## 2023-03-26 MED ORDER — FENTANYL CITRATE PF 50 MCG/ML IJ SOSY
25.0000 ug | PREFILLED_SYRINGE | INTRAMUSCULAR | Status: DC | PRN
  Administered 2023-03-26 (×3): 50 ug via INTRAVENOUS

## 2023-03-26 MED ORDER — CHLORHEXIDINE GLUCONATE 0.12 % MT SOLN
15.0000 mL | Freq: Once | OROMUCOSAL | Status: AC
  Administered 2023-03-26: 15 mL via OROMUCOSAL

## 2023-03-26 MED ORDER — GLYCOPYRROLATE 0.2 MG/ML IJ SOLN
INTRAMUSCULAR | Status: AC
  Filled 2023-03-26: qty 1

## 2023-03-26 MED ORDER — ONDANSETRON HCL 4 MG/2ML IJ SOLN: 4.0000 mg | Freq: Once | INTRAMUSCULAR | Status: DC | PRN

## 2023-03-26 MED ORDER — FENTANYL CITRATE PF 50 MCG/ML IJ SOSY
PREFILLED_SYRINGE | INTRAMUSCULAR | Status: AC
  Filled 2023-03-26: qty 2

## 2023-03-26 MED ORDER — MIDAZOLAM HCL 5 MG/5ML IJ SOLN
INTRAMUSCULAR | Status: DC | PRN
  Administered 2023-03-26: 2 mg via INTRAVENOUS

## 2023-03-26 MED ORDER — CIPROFLOXACIN IN D5W 400 MG/200ML IV SOLN
400.0000 mg | INTRAVENOUS | Status: AC
  Administered 2023-03-26: 400 mg via INTRAVENOUS
  Filled 2023-03-26: qty 200

## 2023-03-26 MED ORDER — MEPERIDINE HCL 50 MG/ML IJ SOLN: 6.2500 mg | INTRAMUSCULAR | Status: DC | PRN

## 2023-03-26 MED ORDER — BUPIVACAINE-EPINEPHRINE 0.25% -1:200000 IJ SOLN
INTRAMUSCULAR | Status: DC | PRN
  Administered 2023-03-26: 30 mL

## 2023-03-26 MED ORDER — ACETAMINOPHEN 160 MG/5ML PO SOLN: 325.0000 mg | ORAL | Status: DC | PRN

## 2023-03-26 MED ORDER — OXYCODONE HCL 5 MG PO TABS
ORAL_TABLET | ORAL | Status: AC
  Filled 2023-03-26: qty 1

## 2023-03-26 MED ORDER — SUGAMMADEX SODIUM 200 MG/2ML IV SOLN
INTRAVENOUS | Status: AC
  Filled 2023-03-26: qty 2

## 2023-03-26 MED ORDER — TRAMADOL HCL 50 MG PO TABS: 50.0000 mg | ORAL_TABLET | Freq: Four times a day (QID) | ORAL | 0 refills | Status: DC | PRN

## 2023-03-26 SURGICAL SUPPLY — 47 items
APPLICATOR ARISTA FLEXITIP XL (MISCELLANEOUS) IMPLANT
APPLIER CLIP 5 13 M/L LIGAMAX5 (MISCELLANEOUS) ×1 IMPLANT
APPLIER CLIP ROT 10 11.4 M/L (STAPLE) IMPLANT
BAG COUNTER SPONGE SURGICOUNT (BAG) IMPLANT
CABLE HIGH FREQUENCY MONO STRZ (ELECTRODE) ×1 IMPLANT
CHLORAPREP W/TINT 26 (MISCELLANEOUS) ×1 IMPLANT
CLIP APPLIE 5 13 M/L LIGAMAX5 (MISCELLANEOUS) IMPLANT
CLIP APPLIE ROT 10 11.4 M/L (STAPLE) IMPLANT
CLIP LIGATING HEMO O LOK GREEN (MISCELLANEOUS) IMPLANT
COVER MAYO STAND XLG (MISCELLANEOUS) IMPLANT
COVER SURGICAL LIGHT HANDLE (MISCELLANEOUS) ×1 IMPLANT
DERMABOND ADVANCED .7 DNX12 (GAUZE/BANDAGES/DRESSINGS) IMPLANT
DERMABOND ADVANCED .7 DNX6 (GAUZE/BANDAGES/DRESSINGS) IMPLANT
DRAPE C-ARM 42X120 X-RAY (DRAPES) IMPLANT
DRSG TEGADERM 2-3/8X2-3/4 SM (GAUZE/BANDAGES/DRESSINGS) ×3 IMPLANT
DRSG TEGADERM 4X4.75 (GAUZE/BANDAGES/DRESSINGS) ×1 IMPLANT
ELECT REM PT RETURN 15FT ADLT (MISCELLANEOUS) ×1 IMPLANT
GAUZE SPONGE 2X2 8PLY STRL LF (GAUZE/BANDAGES/DRESSINGS) ×1 IMPLANT
GLOVE BIO SURGEON STRL SZ7.5 (GLOVE) ×1 IMPLANT
GLOVE INDICATOR 8.0 STRL GRN (GLOVE) ×1 IMPLANT
GOWN STRL REUS W/ TWL XL LVL3 (GOWN DISPOSABLE) ×1 IMPLANT
GRASPER SUT TROCAR 14GX15 (MISCELLANEOUS) IMPLANT
HEMOSTAT ARISTA ABSORB 3G PWDR (HEMOSTASIS) IMPLANT
HEMOSTAT SNOW SURGICEL 2X4 (HEMOSTASIS) IMPLANT
IRRIG SUCT STRYKERFLOW 2 WTIP (MISCELLANEOUS) ×1 IMPLANT
IRRIGATION SUCT STRKRFLW 2 WTP (MISCELLANEOUS) ×1 IMPLANT
KIT BASIN OR (CUSTOM PROCEDURE TRAY) ×1 IMPLANT
KIT TURNOVER KIT A (KITS) IMPLANT
L-HOOK LAP DISP 36CM (ELECTROSURGICAL) IMPLANT
LHOOK LAP DISP 36CM (ELECTROSURGICAL) IMPLANT
POUCH RETRIEVAL ECOSAC 10 (ENDOMECHANICALS) ×1 IMPLANT
SCISSORS LAP 5X35 DISP (ENDOMECHANICALS) ×1 IMPLANT
SET CHOLANGIOGRAPH MIX (MISCELLANEOUS) IMPLANT
SET TUBE SMOKE EVAC HIGH FLOW (TUBING) ×1 IMPLANT
SLEEVE ADV FIXATION 5X100MM (TROCAR) ×1 IMPLANT
SPIKE FLUID TRANSFER (MISCELLANEOUS) ×1 IMPLANT
STRIP CLOSURE SKIN 1/2X4 (GAUZE/BANDAGES/DRESSINGS) ×1 IMPLANT
SUT MNCRL AB 4-0 PS2 18 (SUTURE) ×1 IMPLANT
SUT VIC AB 0 UR5 27 (SUTURE) IMPLANT
SUT VICRYL 0 TIES 12 18 (SUTURE) IMPLANT
SUT VICRYL 0 UR6 27IN ABS (SUTURE) IMPLANT
TOWEL OR 17X26 10 PK STRL BLUE (TOWEL DISPOSABLE) ×1 IMPLANT
TRAY LAPAROSCOPIC (CUSTOM PROCEDURE TRAY) ×1 IMPLANT
TROCAR ADV FIXATION 12X100MM (TROCAR) IMPLANT
TROCAR ADV FIXATION 5X100MM (TROCAR) ×1 IMPLANT
TROCAR BALLN 12MMX100 BLUNT (TROCAR) IMPLANT
TROCAR XCEL NON-BLD 5MMX100MML (ENDOMECHANICALS) IMPLANT

## 2023-03-26 NOTE — H&P (Signed)
 REFERRING PHYSICIAN: Amado Coe*  PROVIDER: Rhiley Tarver Sherril Cong, MD  MRN: [id number] DOB: 1995/11/27 DATE OF ENCOUNTER: 03/07/2023  Subjective   Chief Complaint: New Consultation (GALLBLADDER )   History of Present Illness Lisa [name] is a 28 year old female who presents with right-sided abdominal pain. She is accompanied by her mother. She was referred by Dr. Pollie Meyer for evaluation of her abdominal pain.  She has been experiencing severe right-sided abdominal pain for approximately two months, since December. The pain is intense enough to cause her to cry and is typically triggered after eating. Associated symptoms include nausea, vomiting, and occasional radiation to her back. She also experiences fever associated with her abdominal pain.  Following a recommendation, she visited the emergency room due to her discomfort. During this visit, an ultrasound was performed, revealing the presence of gallstones.  She has attempted to manage her abdominal pain with ibuprofen, which provides inconsistent relief.  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 Her past medical history includes a C-section with a side-to-side incision and tubal ligation. 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 An EEG showed no seizure activity, but she sometimes experiences seizure-like episodes.    Review of Systems: A complete review of systems was obtained from the patient. I have reviewed this information and discussed as appropriate with the patient. See HPI as well for other ROS.  ROS   Medical History: Past Medical History:  Diagnosis Date  Anemia  Anxiety  Asthma, unspecified asthma severity, unspecified whether complicated, unspecified whether persistent (HHS-HCC)  GERD (gastroesophageal reflux disease)  Hypertension  Seizures (CMS/HHS-HCC)  Sleep apnea   Patient  Active Problem List  Diagnosis  Intellectual disability   Past Surgical History:  Procedure Laterality Date  LAPAROSCOPIC TUBAL LIGATION    Allergies  Allergen Reactions  Amoxicillin Hives and Shortness Of Breath  Oxycodone Hives, Shortness Of Breath and Swelling  Iodinated Contrast Media Hives   Current Outpatient Medications on File Prior to Visit  Medication Sig Dispense Refill  albuterol MDI, PROVENTIL, VENTOLIN, PROAIR, HFA 90 mcg/actuation inhaler Inhale 2 Inhalations into the lungs every 4 (four) hours as needed  divalproex (DEPAKOTE) 500 MG DR tablet Take 500 mg by mouth 2 (two) times daily  fluticasone propionate (FLOVENT HFA) 44 mcg/actuation inhaler Inhale 2 Inhalations into the lungs 2 (two) times daily  gabapentin (NEURONTIN) 300 MG capsule Take 1 capsule by mouth 3 (three) times daily  haloperidol decanoate (HALDOL DECANOATE) 100 mg/mL long acting injection Inject 100 mg into the muscle  hydroCHLOROthiazide (MICROZIDE) 12.5 mg capsule Take 12.5 mg by mouth once daily  nicotine (NICODERM CQ) 21 mg/24 hr patch Place 21 mg onto the skin once daily  ondansetron (ZOFRAN) 4 MG tablet Take 4 mg by mouth  traMADoL (ULTRAM) 50 mg tablet Take 50 mg by mouth every 6 (six) hours as needed   No current facility-administered medications on file prior to visit.   Family History  Problem Relation Age of Onset  Stroke Mother  High blood pressure (Hypertension) Mother  Diabetes Father  Hyperlipidemia (Elevated cholesterol) Father  High blood pressure (Hypertension) Father  High blood pressure (Hypertension) Sister  Diabetes Sister    Social History   Tobacco Use  Smoking Status Every Day  Types: Cigarettes  Smokeless Tobacco Not on file    Social History   Socioeconomic History  Marital status: Single  Tobacco Use  Smoking status: Every  Day  Types: Cigarettes  Vaping Use  Vaping status: Unknown  Substance and Sexual Activity  Alcohol use: Not Currently  Drug  use: Never   Social Drivers of Health   Financial Resource Strain: High Risk (01/15/2023)  Received [facility]  Overall Financial Resource Strain (CARDIA)  Difficulty of Paying Living Expenses: Very hard  Food Insecurity: Food Insecurity Present (01/15/2023)  Received from [facility]  Hunger Vital Sign  Worried About Running Out of Food in the Last Year: Sometimes true  Ran Out of Food in the Last Year: Often true  Transportation Needs: No Transportation Needs (01/15/2023)  Received from [facility] - Transportation  Lack of Transportation (Medical): No  Lack of Transportation (Non-Medical): No  Physical Activity: Inactive (01/15/2023)  Received from [facility]  Exercise Vital Sign  Days of Exercise per Week: 0 days  Minutes of Exercise per Session: 0 min  Stress: Stress Concern Present (01/15/2023)  Received from [facility] - Occupational Stress Questionnaire  Feeling of Stress : Very much  Social Connections: Moderately Isolated (01/15/2023)  Received from [facility]  Social Connection and Isolation Panel [NHANES]  Frequency of Communication with Friends and Family: More than three times a week  Frequency of Social Gatherings with Friends and Family: Twice a week  Attends Religious Services: More than 4 times per year  Active Member of Clubs or Organizations: No  Marital Status: Never married  Housing Stability: High Risk (01/15/2023)  Received from [facility]  Housing Stability Vital Sign  Unable to Pay for Housing in the Last Year: Yes  Number of Times Moved in the Last Year: 1  Homeless in the Last Year: Yes   Objective:   Vitals:  03/07/23 1446  BP: 110/70  Temp: 36.9 C (98.4 F)  SpO2: 98%  Weight: (!) 125.2 kg (276 lb)  Height: 157.5 cm (5\' 2" )  PainSc: 8   Body mass index is 50.48 kg/m.  Constitutional: NAD; conversant; no deformities, severe obesity Eyes: Moist conjunctiva; no lid lag; anicteric;  PERRL Neck: Trachea midline; no thyromegaly Lungs: Normal respiratory effort; no tactile fremitus CV: RRR; no palpable thrills; no pitting edema GI: Abd soft, mild TTP RUQ; old c/s scar; no palpable hepatosplenomegaly MSK: Normal gait; no clubbing/cyanosis Psychiatric: Flat affect; alert and oriented x3 Lymphatic: No palpable cervical or axillary lymphadenopathy Skin:no rash/jaundice  Labs, Imaging and Diagnostic Testing: ED note 03/05/23 PCP note 03/06/23, 02/25/23 Neurology note 12/11/22 RUQ u/s 03/05/23- IMPRESSION: 1. Cholelithiasis with gallbladder wall thickening but without sonographic Murphy sign-question acute cholecystitis. Correlate clinically. No biliary dilatation. 2. Hepatic steatosis. CT head 2/25 - wnl Labs 03/05/23 - mild anemia hgb 10.3 - essentially stable, nml plts; nml cmet Labs 02/25/23 - similar to 2/25  Assessment and Plan:    Diagnoses and all orders for this visit:  Symptomatic cholelithiasis  Severe obesity (CMS/HHS-HCC)    Assessment & Plan Cholelithiasis with symptomatic biliary colic Colin Mulders has experienced severe right-sided abdominal pain for two months, often postprandial, with nausea and vomiting. Ultrasound confirmed gallstones. Pain is consistent with biliary colic, lasting 30 minutes to a few hours. Discussed risk of cholecystitis presenting as constant pain. Surgery is the best treatment, involving four incisions and gallbladder removal via a plastic bag. Risks include infection, injury to abdominal structures, bleeding, conversion to open surgery, anesthesia complications, blood clots, bile leak, and common bile duct injury. Anticipated outcome is symptom resolution with a small risk of postoperative loose stools.  Prefer surgery when gallbladder is not inflamed to reduce complications. - Schedule cholecystectomy - Recommend low-fat, low-grease diet - Provide pain management with acetaminophen and ibuprofen - Discuss potential need for stronger  pain medication for breakthrough pain - Advise on laxatives for postoperative constipation - Discuss surgical risks including infection, injury to abdominal structures, bleeding, conversion to open surgery, anesthesia complications, blood clots, bile leak, and common bile duct injury - Provide postoperative care instructions including follow-up in 2-3 weeks  I discussed laparoscopic cholecystectomy in detail. The patient was given educational material as well as diagrams detailing the procedure. We discussed the risks and benefits of a laparoscopic cholecystectomy including, but not limited to bleeding, infection, injury to surrounding structures such as the intestine or liver, bile leak, retained gallstones, need to convert to an open procedure, prolonged diarrhea, blood clots such as DVT, common bile duct injury, anesthesia risks, and possible need for additional procedures. We discussed the typical post-operative recovery course. I explained that the likelihood of improvement of their symptoms is good.  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 - Continue current medication regimen as adjusted by psychiatrist - Monitor for changes in seizure activity  General Health Maintenance Discussed dietary modifications to manage gallbladder symptoms. - Adhere to a low-fat, low-grease diet as recommended by Dr. Pollie Meyer  Follow-up - Schedule follow-up appointment 2-3 weeks post-surgery.  This note has been created using automated tools and reviewed for accuracy by Lesley Atkin MCADAMS Timika Muench.  This patient encounter took 45 minutes today to perform the following: take history, perform exam, extensive review outside records, interpret imaging, counsel the patient on their diagnosis and document encounter, findings & plan in the EHR  No follow-ups on file.  Caydyn Sprung Sherril Cong, MD  General, Minimally Invasive, &  Bariatric Surgery

## 2023-03-26 NOTE — Op Note (Signed)
 Lisa [name] [id number] 25-Jun-1995 03/26/2023  Laparoscopic Cholecystectomy with near infrared fluorescent cholangiography procedure Note  Indications: This patient presents with symptomatic gallbladder disease and will undergo laparoscopic cholecystectomy.  Pre-operative Diagnosis: Symptomatic cholelithiasis  Post-operative Diagnosis: Same  Surgeon: Gaynelle Adu MD FACS  Assistants: none  Anesthesia: General endotracheal anesthesia   Procedure Details  The patient was seen again in the Holding Room. The risks, benefits, complications, treatment options, and expected outcomes were discussed with the patient. The possibilities of reaction to medication, pulmonary aspiration, perforation of viscus, bleeding, recurrent infection, finding a normal gallbladder, the need for additional procedures, failure to diagnose a condition, the possible need to convert to an open procedure, and creating a complication requiring transfusion or operation were discussed with the patient. The likelihood of improving the patient's symptoms with return to their baseline status is good.  The patient and/or family concurred with the proposed plan, giving informed consent. The site of surgery properly noted. The patient was taken to Operating Room, identified as Lisa [name] and the procedure verified as Laparoscopic Cholecystectomy with ICG dye.  A Time Out was held and the above information confirmed. Antibiotic prophylaxis was administered.    ICG dye was administered preoperatively.    General endotracheal anesthesia was then administered and tolerated well. After the induction, the abdomen was prepped with Chloraprep and draped in the sterile fashion. The patient was positioned in the supine position.  Because of her central truncal obesity I elected to gain access to the abdomen using the Optiview technique.  A small incision was made in the left upper quadrant slightly to the left midline near the subcostal  margin.  Then using a 0 degree 5 mm laparoscope I advanced it through the layers of the abdominal wall.  It appeared that I had penetrated the peritoneal layer.  Initial pneumoperitoneum reading revealed a high pressure.  I readvanced the trocar and had it.  I had also pierced the peritoneum again but it still got a high pressure alarm when connecting the insulation tubing.  At this point I decided to not proceed with this approach.  Local anesthetic agent was injected into the skin near the umbilicus and an incision made. We dissected down to the abdominal fascia with blunt dissection.  The fascia was incised vertically and we entered the peritoneal cavity bluntly.  A pursestring suture of 0-Vicryl was placed around the fascial opening.  The Hasson cannula was inserted and secured with the stay suture.  Pneumoperitoneum was then created with CO2 and tolerated well without any adverse changes in the patient's vital signs.  I inspected the area the upper midline.  The trocar that I had placed in the subxiphoid location had not penetrated the peritoneal layer.  There was a small defect in the peritoneal layer but the trocar was still in the soft tissue.  There was no evidence of injury to surrounding structures.  I then readvanced to the 5 mm port through this incision into the upper abdomen.    Two 5-mm ports were placed in the right upper quadrant. All skin incisions were infiltrated with a local anesthetic agent before making the incision and placing the trocars.   We positioned the patient in reverse Trendelenburg, tilted slightly to the patient's left.  The gallbladder was identified, it was distended, the fundus grasped and retracted cephalad. Adhesions were lysed bluntly and with the electrocautery where indicated, taking care not to injure any adjacent organs or viscus. The infundibulum was grasped and  retracted laterally, exposing the peritoneum overlying the triangle of Calot. This was then divided and  exposed in a blunt fashion. A critical view of the cystic duct and cystic artery was obtained.  The cystic duct was clearly identified and bluntly dissected circumferentially.  Utilizing the Stryker camera system near infrared fluorescent activity was visualized in the liver, cystic duct, common hepatic duct and common bile duct.  This served as a secondary confirmation of our anatomy.  The cystic duct was then ligated with 3 clips on the patient side and 1 distally and divided. The cystic artery which had been identified & dissected free was ligated with clips and divided as well.   The gallbladder was dissected from the liver bed in retrograde fashion with the electrocautery.  The posterior wall of the gallbladder was very thin.  There was some spillage of bile from the posterior aspect of the gallbladder but no stone spillage.  The gallbladder was removed and placed in an Ecco sac.  The gallbladder and Ecco sac were then removed through the umbilical port site. The liver bed was irrigated and inspected. Hemostasis was achieved with the electrocautery. Copious irrigation was utilized and was repeatedly aspirated until clear.  The pursestring suture was used to close the umbilical fascia.  Her fascia at the umbilical area was attenuated.  I placed 2 additional interrupted 0 Vicryl sutures using PMI suture passer with laparoscopic guidance.  We again inspected the right upper quadrant for hemostasis.  The umbilical closure was inspected and there was no air leak and nothing trapped within the closure. Pneumoperitoneum was released as we removed the trocars.  4-0 Monocryl was used to close the skin.   Dermabond was applied. The patient was then extubated and brought to the recovery room in stable condition. Instrument, sponge, and needle counts were correct at closure and at the conclusion of the case.   Findings: +critical view   Estimated Blood Loss: Minimal         Drains: none         Specimens:  Gallbladder           Complications: None; patient tolerated the procedure well.         Disposition: PACU - hemodynamically stable.         Condition: stable  Mary Sella. Andrey Campanile, MD, FACS General, Bariatric, & Minimally Invasive Surgery [facility],  [facility]

## 2023-03-26 NOTE — Transfer of Care (Signed)
 Immediate Anesthesia Transfer of Care Note  Patient: Lisa [name]  Procedure(s) Performed: LAPAROSCOPIC CHOLECYSTECTOMY INDOCYANINE GREEN FLUORESCENCE IMAGING (ICG)  Patient Location: PACU  Anesthesia Type:General  Level of Consciousness: awake and alert   Airway & Oxygen Therapy: Patient Spontanous Breathing and Patient connected to face mask oxygen  Post-op Assessment: Report given to RN and Post -op Vital signs reviewed and stable  Post vital signs: Reviewed and stable  Last Vitals:  Vitals Value Taken Time  BP 123/85 03/26/23 0900  Temp 36.4 C 03/26/23 0900  Pulse 94 03/26/23 0901  Resp 30 03/26/23 0901  SpO2 100 % 03/26/23 0901  Vitals shown include unfiled device data.  Last Pain:  Vitals:   03/26/23 4098  TempSrc: Oral  PainSc:       Patients Stated Pain Goal: 5 (03/26/23 0557)  Complications: No notable events documented.

## 2023-03-26 NOTE — Interval H&P Note (Signed)
 History and Physical Interval Note:  03/26/2023 7:12 AM  Lisa [name]  has presented today for surgery, with the diagnosis of SYMPTOMATIC CHOLELITHIASIS.  The various methods of treatment have been discussed with the patient and family. After consideration of risks, benefits and other options for treatment, the patient has consented to  Procedure(s): LAPAROSCOPIC CHOLECYSTECTOMY (N/A) INDOCYANINE GREEN FLUORESCENCE IMAGING (ICG) (N/A) as a surgical intervention.  The patient's history has been reviewed, patient examined, no change in status, stable for surgery.  I have reviewed the patient's chart and labs.  Questions were answered to the patient's satisfaction.     Gaynelle Adu

## 2023-03-26 NOTE — Discharge Instructions (Signed)

## 2023-03-26 NOTE — Anesthesia Postprocedure Evaluation (Signed)
 Anesthesia Post Note  Patient: Lisa [name]  Procedure(s) Performed: LAPAROSCOPIC CHOLECYSTECTOMY INDOCYANINE GREEN FLUORESCENCE IMAGING (ICG)     Patient location during evaluation: PACU Anesthesia Type: General Level of consciousness: awake and alert Pain management: pain level controlled Vital Signs Assessment: post-procedure vital signs reviewed and stable Respiratory status: spontaneous breathing, nonlabored ventilation and respiratory function stable Cardiovascular status: blood pressure returned to baseline and stable Postop Assessment: no apparent nausea or vomiting Anesthetic complications: no   No notable events documented.  Last Vitals:  Vitals:   03/26/23 1000 03/26/23 1025  BP: 112/81 (!) 143/96  Pulse: 80 73  Resp: 17 16  Temp:  36.5 C  SpO2: 97% 100%    Last Pain:  Vitals:   03/26/23 1025  TempSrc: Oral  PainSc: 5                  Lowella Curb

## 2023-03-26 NOTE — Anesthesia Procedure Notes (Signed)
 Procedure Name: Intubation Date/Time: 03/26/2023 7:51 AM  Performed by: Deri Fuelling, CRNAPre-anesthesia Checklist: Patient identified, Emergency Drugs available, Suction available and Patient being monitored Patient Re-evaluated:Patient Re-evaluated prior to induction Oxygen Delivery Method: Circle system utilized Preoxygenation: Pre-oxygenation with 100% oxygen Induction Type: IV induction Ventilation: Mask ventilation without difficulty Laryngoscope Size: Mac and 3 Grade View: Grade I Tube type: Oral Tube size: 7.0 mm Number of attempts: 1 Airway Equipment and Method: Stylet and Oral airway Placement Confirmation: ETT inserted through vocal cords under direct vision, positive ETCO2 and breath sounds checked- equal and bilateral Secured at: 21 cm Tube secured with: Tape Dental Injury: Teeth and Oropharynx as per pre-operative assessment

## 2023-03-27 ENCOUNTER — Encounter (HOSPITAL_COMMUNITY): Payer: Self-pay | Admitting: General Surgery

## 2023-03-27 LAB — SURGICAL PATHOLOGY

## 2023-04-09 ENCOUNTER — Telehealth: Payer: MEDICAID | Admitting: Family Medicine

## 2023-04-09 ENCOUNTER — Ambulatory Visit (HOSPITAL_COMMUNITY): Payer: MEDICAID

## 2023-04-09 DIAGNOSIS — R32 Unspecified urinary incontinence: Secondary | ICD-10-CM | POA: Diagnosis not present
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
## 2023-04-10 ENCOUNTER — Ambulatory Visit (INDEPENDENT_AMBULATORY_CARE_PROVIDER_SITE_OTHER): Payer: MEDICAID

## 2023-04-10 VITALS — BP 109/60 | HR 90 | Ht 62.0 in | Wt 262.0 lb

## 2023-04-10 DIAGNOSIS — F200 Paranoid schizophrenia: Secondary | ICD-10-CM

## 2023-04-10 DIAGNOSIS — F4310 Post-traumatic stress disorder, unspecified: Secondary | ICD-10-CM | POA: Diagnosis not present

## 2023-04-10 DIAGNOSIS — G40909 Epilepsy, unspecified, not intractable, without status epilepticus: Secondary | ICD-10-CM

## 2023-04-10 MED ORDER — HALOPERIDOL DECANOATE 100 MG/ML IM SOLN
100.0000 mg | Freq: Once | INTRAMUSCULAR | Status: AC
Start: 2023-04-10 — End: 2023-04-10
  Administered 2023-04-10: 100 mg via INTRAMUSCULAR
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
## 2023-04-10 NOTE — Progress Notes (Cosign Needed)
 Pt presents today for injection of Haldol 100 Mg/ ml. Pt tolerated injection well, with no complaints. Pt tolerated in left deltoid.    JNL, CMA   Pt presents to be well groomed and has a very sweet personality. She is very kind and is doing great on her medication. States that she will be having surgery this upcoming April and is nervous. Pt denies any AVH, SI, and HI.

## 2023-04-26 ENCOUNTER — Encounter: Payer: Self-pay | Admitting: Family Medicine

## 2023-05-08 ENCOUNTER — Encounter (HOSPITAL_COMMUNITY): Payer: Self-pay

## 2023-05-08 ENCOUNTER — Ambulatory Visit (INDEPENDENT_AMBULATORY_CARE_PROVIDER_SITE_OTHER): Payer: MEDICAID | Admitting: *Deleted

## 2023-05-08 VITALS — BP 114/80 | HR 109 | Resp 18 | Ht 62.0 in | Wt 270.2 lb

## 2023-05-08 DIAGNOSIS — F200 Paranoid schizophrenia: Secondary | ICD-10-CM | POA: Diagnosis not present

## 2023-05-08 MED ORDER — HALOPERIDOL DECANOATE 100 MG/ML IM SOLN
100.0000 mg | Freq: Once | INTRAMUSCULAR | Status: AC
Start: 2023-05-08 — End: 2023-05-08
  Administered 2023-05-08: 100 mg via INTRAMUSCULAR

## 2023-05-08 NOTE — Progress Notes (Cosign Needed)
 Patient arrived for her injection of Haldol  100mg  injection that was supplied by her pharmacy. Patient was pleasant, cooperative with a bright affect. Walking and states she has not been stressed and has been doing great. Injection prepared as ordered and given in Right Deltoid without issue or complaint. Denies SI/HI or AV hallucinations.

## 2023-06-05 ENCOUNTER — Ambulatory Visit (INDEPENDENT_AMBULATORY_CARE_PROVIDER_SITE_OTHER): Payer: MEDICAID | Admitting: Family

## 2023-06-05 ENCOUNTER — Ambulatory Visit (INDEPENDENT_AMBULATORY_CARE_PROVIDER_SITE_OTHER): Payer: MEDICAID

## 2023-06-05 VITALS — BP 111/89 | HR 89 | Ht 62.0 in | Wt 264.0 lb

## 2023-06-05 DIAGNOSIS — F6381 Intermittent explosive disorder: Secondary | ICD-10-CM

## 2023-06-05 DIAGNOSIS — F411 Generalized anxiety disorder: Secondary | ICD-10-CM

## 2023-06-05 DIAGNOSIS — F639 Impulse disorder, unspecified: Secondary | ICD-10-CM

## 2023-06-05 DIAGNOSIS — F4310 Post-traumatic stress disorder, unspecified: Secondary | ICD-10-CM

## 2023-06-05 MED ORDER — HALOPERIDOL DECANOATE 100 MG/ML IM SOLN
100.0000 mg | Freq: Once | INTRAMUSCULAR | Status: AC
  Administered 2023-06-05: 100 mg via INTRAMUSCULAR

## 2023-06-05 MED ORDER — HALOPERIDOL DECANOATE 100 MG/ML IM SOLN: 100.0000 mg | INTRAMUSCULAR | 5 refills | Status: DC
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
## 2023-06-05 NOTE — Progress Notes (Cosign Needed)
 Pt presents today for injection of Haldol  100 Mg/ ml. Pt tolerated injection well L-deltiod, with no complaints.      Pt presents to be well groomed and has a very sweet personality. She is very kind and is doing great on her medication. States that she will be having surgery this upcoming April and is nervous. Pt denies any AVH, SI, and HI.

## 2023-06-27 ENCOUNTER — Other Ambulatory Visit (HOSPITAL_COMMUNITY): Payer: Self-pay | Admitting: Family Medicine

## 2023-06-27 ENCOUNTER — Ambulatory Visit: Payer: MEDICAID | Admitting: Family Medicine

## 2023-06-27 ENCOUNTER — Other Ambulatory Visit (HOSPITAL_COMMUNITY)
Admission: RE | Admit: 2023-06-27 | Discharge: 2023-06-27 | Disposition: A | Discharge status: Home / Self Care | Payer: MEDICAID | Source: Ambulatory Visit | Attending: Family Medicine | Admitting: Family Medicine

## 2023-06-27 VITALS — BP 118/80 | HR 100 | Temp 98.4°F | Wt 270.6 lb

## 2023-06-27 DIAGNOSIS — T7840XD Allergy, unspecified, subsequent encounter: Secondary | ICD-10-CM

## 2023-06-27 DIAGNOSIS — Z91013 Allergy to seafood: Secondary | ICD-10-CM

## 2023-06-27 DIAGNOSIS — R519 Headache, unspecified: Secondary | ICD-10-CM

## 2023-06-27 DIAGNOSIS — F639 Impulse disorder, unspecified: Secondary | ICD-10-CM

## 2023-06-27 DIAGNOSIS — Z113 Encounter for screening for infections with a predominantly sexual mode of transmission: Secondary | ICD-10-CM | POA: Insufficient documentation

## 2023-06-27 DIAGNOSIS — F6381 Intermittent explosive disorder: Secondary | ICD-10-CM

## 2023-06-27 DIAGNOSIS — J4540 Moderate persistent asthma, uncomplicated: Secondary | ICD-10-CM

## 2023-06-27 DIAGNOSIS — H538 Other visual disturbances: Secondary | ICD-10-CM

## 2023-06-27 MED ORDER — EPINEPHRINE 0.3 MG/0.3ML IJ SOAJ: 0.3000 mg | Freq: Once | INTRAMUSCULAR | 1 refills | Status: AC | PRN

## 2023-06-27 NOTE — Assessment & Plan Note (Signed)
 Refilled epipen  for her to have on hand

## 2023-06-27 NOTE — Progress Notes (Signed)
  Date of Visit: 06/27/2023   SUBJECTIVE:   HPI:  Lisa [name] presents today for STI testing.  STI check: Denies any symptoms of sexually transmitted infections.  No pelvic pain or vaginal discharge.  Just wants to be screened.  Also needs her EpiPen  refilled  Headache: Mentions having mild headaches for about a week.  Tried Tylenol  without any improvement.  Has not tried ibuprofen .  No vomiting.  Does endorse having some blurry vision for the last 3 days.  OBJECTIVE:   BP 118/80   Pulse 100   Temp 98.4 F (36.9 C)   Wt 270 lb 9.6 oz (122.7 kg)   SpO2 93%   BMI 49.49 kg/m  Gen: no acute distress, pleasant cooperative, well appearing HEENT: normocephalic, atraumatic  Neuro: cranial nerves II-XII tested and intact. Speech normal. Full strength bilat upper and lower ext. Normal FNF.  GU: normal appearing external genitalia without lesions. Vagina is moist with normal appearing discharge discharge. Cervix normal in appearance. Chaperone present: Lisa [name], CMA   ASSESSMENT/PLAN:   Assessment & Plan Routine screening for STI (sexually transmitted infection) Gc/chl/trich vaginal swab obtained today HIV, RPR lab draw Nonintractable headache, unspecified chronicity pattern, unspecified headache type Intermittent nonsevere headache. Normal neuro exam except visual acuity. With blurry vision for last few days. Visual acuity 20/70 bilaterally - unclear chronicity, may just need glasses though needs evaluation. Placed urgent referral to ophthalmology Elevated BMI does place her at risk for idiopathic intracranial hypertension - ophtho eval will help delineate & evaluate for presence of papilledema Discussed trial of NSAID rather than tylenol  to see if it helps Return precautions discussed Allergy history, seafood Refilled epipen  for her to have on hand    FOLLOW UP: Follow up as needed if symptoms worsen or fail to improve.   Referring to ophthalmology  Lisa [name]. Lisa Eth,  MD [facility]

## 2023-06-27 NOTE — Patient Instructions (Signed)
 It was great to see you again today.  STI testing obtained today Referring to eye doctor for your vision issues Let us  know if headache gets worse  Be well, Dr. Dawn Eth

## 2023-06-27 NOTE — Assessment & Plan Note (Signed)
 Intermittent nonsevere headache. Normal neuro exam except visual acuity. With blurry vision for last few days. Visual acuity 20/70 bilaterally - unclear chronicity, may just need glasses though needs evaluation. Placed urgent referral to ophthalmology Elevated BMI does place her at risk for idiopathic intracranial hypertension - ophtho eval will help delineate & evaluate for presence of papilledema Discussed trial of NSAID rather than tylenol  to see if it helps Return precautions discussed

## 2023-06-28 ENCOUNTER — Ambulatory Visit: Payer: Self-pay | Admitting: Family Medicine

## 2023-06-28 LAB — CERVICOVAGINAL ANCILLARY ONLY
Chlamydia: NEGATIVE
Comment: NEGATIVE
Comment: NEGATIVE
Comment: NORMAL
Neisseria Gonorrhea: NEGATIVE
Trichomonas: NEGATIVE

## 2023-06-28 LAB — RPR: RPR Ser Ql: NONREACTIVE

## 2023-06-28 LAB — HIV ANTIBODY (ROUTINE TESTING W REFLEX): HIV Screen 4th Generation wRfx: NONREACTIVE

## 2023-07-01 ENCOUNTER — Other Ambulatory Visit (HOSPITAL_COMMUNITY): Payer: Self-pay | Admitting: Family

## 2023-07-01 ENCOUNTER — Telehealth (HOSPITAL_COMMUNITY): Payer: Self-pay

## 2023-07-01 DIAGNOSIS — F6381 Intermittent explosive disorder: Secondary | ICD-10-CM

## 2023-07-01 DIAGNOSIS — F639 Impulse disorder, unspecified: Secondary | ICD-10-CM

## 2023-07-01 MED ORDER — DIVALPROEX SODIUM 250 MG PO DR TAB
250.0000 mg | DELAYED_RELEASE_TABLET | Freq: Two times a day (BID) | ORAL | 0 refills | Status: DC
Start: 2023-07-01 — End: 2023-08-29

## 2023-07-01 MED ORDER — CLONIDINE HCL 0.1 MG PO TABS: 0.1000 mg | ORAL_TABLET | Freq: Every day | ORAL | 0 refills | Status: DC

## 2023-07-01 NOTE — Progress Notes (Signed)
 Depakote  250 mg po BID was refilled, follow-up for valproic level. Patingt is under the care of WENDI Bach DNP?

## 2023-07-01 NOTE — Telephone Encounter (Signed)
 Hello,   This is Dr.Parsons Patient and she's out at the moment,   Mother called today and states that PT is out of Depakote  and clonidine  and would like a refill [facility].   Pt last seen:06/05/2023 by provider  JNL,CMA

## 2023-07-03 ENCOUNTER — Ambulatory Visit (INDEPENDENT_AMBULATORY_CARE_PROVIDER_SITE_OTHER): Payer: MEDICAID

## 2023-07-03 VITALS — BP 105/75 | HR 106 | Ht 62.0 in | Wt 272.6 lb

## 2023-07-03 DIAGNOSIS — F902 Attention-deficit hyperactivity disorder, combined type: Secondary | ICD-10-CM

## 2023-07-03 DIAGNOSIS — F4310 Post-traumatic stress disorder, unspecified: Secondary | ICD-10-CM | POA: Diagnosis not present

## 2023-07-03 MED ORDER — HALOPERIDOL DECANOATE 100 MG/ML IM SOLN
100.0000 mg | Freq: Once | INTRAMUSCULAR | Status: AC
  Administered 2023-07-03: 100 mg via INTRAMUSCULAR

## 2023-07-03 NOTE — Progress Notes (Cosign Needed)
 Pt presents today for injection of Haldol  100 Mg/ ml. Pt tolerated injection well R-deltiod, with no complaints.    JNL,CMA   Pt presents to be well groomed and has a very sweet personality. She is very kind and is doing great on her medication. . Pt denies any AVH, SI, and HI. Pt states that she is doing good on meds, and that for her and her mom both this has been a long journey to success and that they are grateful for the staff here, and care they receive to help PT meet her goals.

## 2023-07-04 NOTE — Telephone Encounter (Signed)
 Message acknowledged and reviewed.

## 2023-07-31 ENCOUNTER — Ambulatory Visit (INDEPENDENT_AMBULATORY_CARE_PROVIDER_SITE_OTHER): Payer: MEDICAID

## 2023-07-31 ENCOUNTER — Encounter (HOSPITAL_COMMUNITY): Payer: Self-pay

## 2023-07-31 VITALS — BP 140/98 | HR 99 | Wt 266.2 lb

## 2023-07-31 DIAGNOSIS — F200 Paranoid schizophrenia: Secondary | ICD-10-CM

## 2023-07-31 DIAGNOSIS — F411 Generalized anxiety disorder: Secondary | ICD-10-CM

## 2023-07-31 DIAGNOSIS — G4700 Insomnia, unspecified: Secondary | ICD-10-CM

## 2023-07-31 MED ORDER — HALOPERIDOL DECANOATE 100 MG/ML IM SOLN
100.0000 mg | Freq: Once | INTRAMUSCULAR | Status: AC
  Administered 2023-07-31: 100 mg via INTRAMUSCULAR

## 2023-07-31 NOTE — Progress Notes (Signed)
 Patient arrived for her injection of Haldol  100mg  injection that was supplied by her pharmacy. Patient was pleasant, cooperative with a bright affect. Walking and states she has not been stressed and has been doing great. Injection prepared as ordered and given in Right Deltoid without issue or complaint. Denies SI/HI or AV hallucinations.

## 2023-08-28 ENCOUNTER — Encounter (HOSPITAL_COMMUNITY): Payer: Self-pay

## 2023-08-28 ENCOUNTER — Ambulatory Visit (INDEPENDENT_AMBULATORY_CARE_PROVIDER_SITE_OTHER): Payer: MEDICAID

## 2023-08-28 ENCOUNTER — Other Ambulatory Visit (HOSPITAL_COMMUNITY): Payer: Self-pay | Admitting: Family

## 2023-08-28 VITALS — BP 145/89 | HR 103 | Ht 62.0 in | Wt 262.4 lb

## 2023-08-28 DIAGNOSIS — F200 Paranoid schizophrenia: Secondary | ICD-10-CM | POA: Diagnosis not present

## 2023-08-28 DIAGNOSIS — F639 Impulse disorder, unspecified: Secondary | ICD-10-CM | POA: Diagnosis not present

## 2023-08-28 DIAGNOSIS — F411 Generalized anxiety disorder: Secondary | ICD-10-CM

## 2023-08-28 DIAGNOSIS — F6381 Intermittent explosive disorder: Secondary | ICD-10-CM

## 2023-08-28 MED ORDER — HALOPERIDOL DECANOATE 100 MG/ML IM SOLN
100.0000 mg | Freq: Once | INTRAMUSCULAR | Status: AC
  Administered 2023-08-28: 100 mg via INTRAMUSCULAR
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
## 2023-08-28 NOTE — Progress Notes (Cosign Needed)
 Patient presented to office with mother for injection of Haldol  100 mg. Patient was seen by provider. Patient received injection in left deltoid. Patient tolerated injection well. Patient will return in 28 days.

## 2023-08-29 MED ORDER — CLONIDINE HCL 0.1 MG PO TABS: 0.1000 mg | ORAL_TABLET | Freq: Every day | ORAL | 0 refills | Status: DC

## 2023-08-29 MED ORDER — GABAPENTIN 300 MG PO CAPS: 300.0000 mg | ORAL_CAPSULE | Freq: Three times a day (TID) | ORAL | 1 refills | Status: DC

## 2023-08-29 MED ORDER — DIVALPROEX SODIUM 250 MG PO DR TAB: 250.0000 mg | DELAYED_RELEASE_TABLET | Freq: Two times a day (BID) | ORAL | 0 refills | Status: DC

## 2023-09-02 ENCOUNTER — Encounter: Payer: Self-pay | Admitting: Family Medicine

## 2023-09-02 ENCOUNTER — Ambulatory Visit (INDEPENDENT_AMBULATORY_CARE_PROVIDER_SITE_OTHER): Payer: MEDICAID | Admitting: Family Medicine

## 2023-09-02 VITALS — BP 139/102 | HR 99 | Ht 62.0 in | Wt 261.2 lb

## 2023-09-02 DIAGNOSIS — I10 Essential (primary) hypertension: Secondary | ICD-10-CM

## 2023-09-02 DIAGNOSIS — R32 Unspecified urinary incontinence: Secondary | ICD-10-CM | POA: Diagnosis not present

## 2023-09-02 DIAGNOSIS — Z0000 Encounter for general adult medical examination without abnormal findings: Secondary | ICD-10-CM

## 2023-09-02 DIAGNOSIS — Z7251 High risk heterosexual behavior: Secondary | ICD-10-CM

## 2023-09-02 DIAGNOSIS — Z113 Encounter for screening for infections with a predominantly sexual mode of transmission: Secondary | ICD-10-CM

## 2023-09-02 DIAGNOSIS — Z720 Tobacco use: Secondary | ICD-10-CM | POA: Diagnosis not present

## 2023-09-02 DIAGNOSIS — Z124 Encounter for screening for malignant neoplasm of cervix: Secondary | ICD-10-CM

## 2023-09-02 DIAGNOSIS — R87612 Low grade squamous intraepithelial lesion on cytologic smear of cervix (LGSIL): Secondary | ICD-10-CM

## 2023-09-02 LAB — POCT URINALYSIS DIP (MANUAL ENTRY)
Blood, UA: NEGATIVE
Glucose, UA: NEGATIVE mg/dL
Nitrite, UA: NEGATIVE
Protein Ur, POC: 30 mg/dL — AB
Spec Grav, UA: 1.02
Urobilinogen, UA: 1 U/dL
pH, UA: 7

## 2023-09-02 LAB — POCT UA - MICROSCOPIC ONLY
Epithelial cells, urine per micros: >20
RBC, Urine, Miroscopic: NONE SEEN (ref 0–2)

## 2023-09-02 NOTE — Progress Notes (Signed)
  Date of Visit: 09/02/2023   SUBJECTIVE:   HPI:  Discussed the use of AI scribe software for clinical note transcription with the patient, who gave verbal consent to proceed.  History of Present Illness SHIFA BRISBON is a 28 year old female who presents with a strong odor and urinary symptoms. She is accompanied by her mother.  Malodorous urine and urinary incontinence - Strong odor resembling urine present - Urinary leakage occurs, sometimes soaking through clothing - Urine described as dark brown to yellow in color - Urine occasionally appears bubbly - Wears Depends but continues to experience leakage - Mother encouraged increased water intake to address dark urine  Tobacco use and nicotine  replacement therapy - Smokes Black and Milds, approximately two per week - Using nicotine  patches to reduce smoking - No nicotine  patch currently in use, but has a new box available at home  Hypertension - Taking hydrochlorothiazide  12.5mg  daily but did not take this AM yet    OBJECTIVE:   BP (!) 126/109   Pulse 99   Ht 5' 2 (1.575 m)   Wt 261 lb 3.2 oz (118.5 kg)   SpO2 100%   BMI 47.77 kg/m  Gen: no acute distress, pleasant, cooperative HEENT: normocephalic, atraumatic  Lungs: normal work of breathing  Neuro: grossly nonfocal, speech normal Ext: No appreciable lower extremity edema bilaterally   ASSESSMENT/PLAN:   Assessment & Plan Hypertension, unspecified type Blood pressure elevated, likely due to missed hydrochlorothiazide . Lacks a functioning home blood pressure monitor. - Send a new blood pressure cuff to her. - Instructed her to take her blood pressure medication and recheck her blood pressure at home. Tobacco use Uses nicotine  patches but is out. Smokes Black and Milds, approximately two per week, indicating reduced use. Continue to encourage cessation Urinary incontinence, unspecified type Reports strong odor and dark urine. Suspect this is incontinence from  developmental delay rather than an organic issue, but worth evaluating. Unclear whether urine odor and color issues are actually stool and not urine - had planned to perform wet prep & STI testing, however patient had an episode of fecal incontinence in the office today in her depends. Mom cleaned her up and they preferred to delay pelvic exam to another visit.  - UA to eval for signs of UTI - does show signs of infection but was not clean catch (many squams) and with episode of fecal incontinence doubt this UA is reliable. Will need to be recollected at future visit Routine adult health maintenance - planned to update pap today (prior +HPV with ASCUS, negative colpo biopsy) but could not perform pelvic exam due to episode of stool incontinence, patient to reschedule for exam - discussed pneumococcal vaccine, patient was willing but visit cut short by incontinence episode (patient and mother preferred to leave then). Will rediscuss at future visit    [place] J. Donah, MD [facility]

## 2023-09-02 NOTE — Assessment & Plan Note (Signed)
 Uses nicotine  patches but is out. Smokes Black and Milds, approximately two per week, indicating reduced use. Continue to encourage cessation

## 2023-09-02 NOTE — Assessment & Plan Note (Addendum)
 Reports strong odor and dark urine. Suspect this is incontinence from developmental delay rather than an organic issue, but worth evaluating. Unclear whether urine odor and color issues are actually stool and not urine - had planned to perform wet prep & STI testing, however patient had an episode of fecal incontinence in the office today in her depends. Mom cleaned her up and they preferred to delay pelvic exam to another visit.  - UA to eval for signs of UTI - does show signs of infection but was not clean catch (many squams) and with episode of fecal incontinence doubt this UA is reliable. Will need to be recollected at future visit

## 2023-09-02 NOTE — Assessment & Plan Note (Signed)
-   planned to update pap today (prior +HPV with ASCUS, negative colpo biopsy) but could not perform pelvic exam due to episode of stool incontinence, patient to reschedule for exam - discussed pneumococcal vaccine, patient was willing but visit cut short by incontinence episode (patient and mother preferred to leave then). Will rediscuss at future visit

## 2023-09-02 NOTE — Assessment & Plan Note (Signed)
 planned to update pap today (prior +HPV with ASCUS, negative colpo biopsy) but could not perform pelvic exam due to episode of stool incontinence, patient to reschedule for exam

## 2023-09-02 NOTE — Assessment & Plan Note (Addendum)
 Blood pressure elevated, likely due to missed hydrochlorothiazide . Lacks a functioning home blood pressure monitor. - Send a new blood pressure cuff to her. - Instructed her to take her blood pressure medication and recheck her blood pressure at home.

## 2023-09-19 ENCOUNTER — Ambulatory Visit: Payer: MEDICAID | Admitting: Family Medicine

## 2023-09-19 VITALS — BP 130/80 | HR 90 | Temp 98.4°F | Ht 62.0 in | Wt 258.0 lb

## 2023-09-19 DIAGNOSIS — Z23 Encounter for immunization: Secondary | ICD-10-CM | POA: Diagnosis not present

## 2023-09-19 DIAGNOSIS — M7711 Lateral epicondylitis, right elbow: Secondary | ICD-10-CM | POA: Diagnosis not present

## 2023-09-19 DIAGNOSIS — Z0000 Encounter for general adult medical examination without abnormal findings: Secondary | ICD-10-CM

## 2023-09-19 NOTE — Patient Instructions (Signed)
 It was great to see you again today.  Recommend you take ibuprofen  (400mg  total every 4-6 hours as needed) Try icing the area Follow up if not improving  Be well, Dr. Donah  Tennis Elbow: What to Know  Tennis elbow is swelling (inflammation) of the tendons in your outer forearm, near your elbow. Tendons connect muscle to bone. Tennis elbow can affect anyone who plays a sport or does a job where they have to do the same motion over and over. What are the causes? Tennis elbow is often caused by: Repeatedly moving your wrist, forearm, or hands. Playing sports or doing work where you need to keep moving your forearm the same way. Sudden injury. What increases the risk? You're more likely to get tennis elbow if you play tennis or another racket sport. You also have a higher risk if you often use your hands for work. This includes: People who use computers. Holiday representative workers. Factory workers. Musicians. Cooks. Cashiers. What are the signs or symptoms? Pain and tenderness along the outside of your forearm and elbow. It might hurt all the time or only when you use your arm. A burning feeling. This starts in your elbow and spreads down your arm. A weak grip in your hand. How is this treated? Resting and icing your arm. Medicines, such as ibuprofen . Steroid shots. An elbow brace or wrist splint. Physical therapy. This may include massage or exercises or both. An elbow brace or wrist strap. Surgery. This is rare. Follow these instructions at home: If you have a brace or strap: Wear the brace or strap as told by your doctor. Take it off only if your doctor says you can. Check the skin around it every day. Tell your doctor if you see problems. Loosen the brace or strap if your fingers tingle, are numb, or turn cold and blue. Keep the brace or strap clean. If the brace or strap isn't waterproof: Do not let it get wet. Cover it when you take a bath or a shower. Use a cover that  doesn't let water in. Managing pain, stiffness, and swelling  Use ice or an ice pack as told. If you have a brace or strap that you can take off, remove it only as told. Place a towel between your skin and the ice. Leave the ice on for 20 minutes, 2-3 times a day. If your skin turns red, take off the ice right away to prevent skin damage. The risk of damage is higher if you can't feel pain, heat, or cold. Move your fingers often to reduce stiffness and swelling. Raise your arm above the level of your heart while you're sitting or lying down. Use pillows as needed. Activity Rest your elbow and wrist. Avoid activities that can cause elbow problems. Do exercises as told by your doctor. Lift objects with the palm of your hand facing up. Lifestyle If your tennis elbow is caused by sports, check your equipment. Make sure that: You're using it the right way. It fits you well. If your tennis elbow is caused by work or computer use, make sure to: Take breaks often to stretch your arm. Talk with your manager about how you can make your condition better at work. General instructions Take your medicine as told. Do not smoke, vape, or use nicotine  or tobacco. Doing this can slow down healing. How is this prevented? Warm up and stretch before being active. Cool down and stretch after being active. Give your body time to rest  between activities. Use equipment that fits you. If you play tennis, put power in your stroke with your lower body. Avoid using your arm only. Stay fit. Keep your body strong and flexible. Do exercises to strengthen the forearm muscles. Contact a doctor if: Your pain gets worse. Your pain doesn't get better with treatment. You have weakness in your forearm, hand, or fingers. You lose feeling (have numbness) in your forearm, hand, or fingers. Get help right away if: Your pain is very bad. You can't move your wrist. This information is not intended to replace advice  given to you by your health care provider. Make sure you discuss any questions you have with your health care provider. Document Revised: 08/20/2022 Document Reviewed: 08/20/2022 Elsevier Patient Education  2024 [employer].
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
## 2023-09-21 NOTE — Assessment & Plan Note (Signed)
 Flu shot today Return for pap

## 2023-09-25 ENCOUNTER — Ambulatory Visit (INDEPENDENT_AMBULATORY_CARE_PROVIDER_SITE_OTHER): Payer: MEDICAID

## 2023-09-25 ENCOUNTER — Encounter (HOSPITAL_COMMUNITY): Payer: Self-pay

## 2023-09-25 VITALS — BP 128/91 | HR 97 | Wt 259.0 lb

## 2023-09-25 DIAGNOSIS — F411 Generalized anxiety disorder: Secondary | ICD-10-CM | POA: Diagnosis not present

## 2023-09-25 DIAGNOSIS — F200 Paranoid schizophrenia: Secondary | ICD-10-CM

## 2023-09-25 MED ORDER — HALOPERIDOL DECANOATE 100 MG/ML IM SOLN
100.0000 mg | Freq: Once | INTRAMUSCULAR | Status: AC
  Administered 2023-09-25: 100 mg via INTRAMUSCULAR

## 2023-09-25 NOTE — Progress Notes (Signed)
 Patient arrived for her injection of Haldol  100mg  injection that was supplied by her pharmacy. Patient was pleasant, cooperative with a bright affect. Walking and states she has not been stressed and has been doing great. Injection prepared as ordered and given in Right Deltoid without issue or complaint. Denies SI/HI or AV hallucinations.

## 2023-09-30 ENCOUNTER — Encounter: Payer: Self-pay | Admitting: Family Medicine

## 2023-09-30 ENCOUNTER — Ambulatory Visit (INDEPENDENT_AMBULATORY_CARE_PROVIDER_SITE_OTHER): Payer: MEDICAID | Admitting: Family Medicine

## 2023-09-30 ENCOUNTER — Other Ambulatory Visit (HOSPITAL_COMMUNITY)
Admission: RE | Admit: 2023-09-30 | Discharge: 2023-09-30 | Disposition: A | Discharge status: Home / Self Care | Payer: MEDICAID | Source: Ambulatory Visit | Attending: Family Medicine | Admitting: Family Medicine

## 2023-09-30 VITALS — BP 115/88 | HR 88 | Ht 62.0 in | Wt 257.2 lb

## 2023-09-30 DIAGNOSIS — Z124 Encounter for screening for malignant neoplasm of cervix: Secondary | ICD-10-CM

## 2023-09-30 DIAGNOSIS — Z0000 Encounter for general adult medical examination without abnormal findings: Secondary | ICD-10-CM

## 2023-09-30 DIAGNOSIS — Z113 Encounter for screening for infections with a predominantly sexual mode of transmission: Secondary | ICD-10-CM

## 2023-09-30 DIAGNOSIS — Z23 Encounter for immunization: Secondary | ICD-10-CM | POA: Diagnosis not present

## 2023-09-30 NOTE — Progress Notes (Signed)
  Date of Visit: 09/30/2023   SUBJECTIVE:   HPI:  Discussed the use of AI scribe software for clinical note transcription with the patient, who gave verbal consent to proceed.  History of Present Illness Lisa [name] is a 28 year old female who presents for a routine Pap smear and STD testing.  Sexual health screening - Requests routine Pap smear and STD testing to maintain sexual health - No specific symptoms or concerns for sexually transmitted infections - Requests both swabs and blood work for STD testing - No history of anal or oral sexual activity    OBJECTIVE:   BP 115/88   Pulse 88   Ht 5' 2 (1.575 m)   Wt 257 lb 3.2 oz (116.7 kg)   LMP 09/15/2023 (Approximate)   SpO2 100%   BMI 47.04 kg/m  Gen: no acute distress, pleasant, cooperative GU: normal appearing external genitalia without lesions. Vagina is moist with thin discharge. Cervix with small erythematous lesion present at 7 oclock position. No cervical motion tenderness or tenderness on bimanual exam. No adnexal masses. Chaperone present: Lisa [name], CMA   ASSESSMENT/PLAN:   Assessment & Plan Screening for malignant neoplasm of cervix Last pap 10/15/22, ASCUS with positive HPV. Had subsequent colpo with benign biopsy  Pap cotesting obtained today, though notably does have lesion present on cervix (appears to be at site of prior biopsy) Scheduled repeat colpo next month  Routine screening for STI (sexually transmitted infection) HIV, RPR, gc/chl/trich obtained today Routine adult health maintenance Flu shot today   Lisa [name] J. Donah, MD [facility]

## 2023-09-30 NOTE — Patient Instructions (Signed)
 It was great to see you again today.  Pap smear and STI testing today Scheduled colposcopy visit  Be well, Dr. Donah

## 2023-10-01 LAB — RPR

## 2023-10-02 NOTE — Assessment & Plan Note (Signed)
 Flu shot today

## 2023-10-03 ENCOUNTER — Ambulatory Visit: Payer: Self-pay | Admitting: Family Medicine

## 2023-10-03 DIAGNOSIS — A539 Syphilis, unspecified: Secondary | ICD-10-CM | POA: Insufficient documentation

## 2023-10-03 DIAGNOSIS — A515 Early syphilis, latent: Secondary | ICD-10-CM | POA: Insufficient documentation

## 2023-10-03 LAB — HIV ANTIBODY (ROUTINE TESTING W REFLEX): HIV Screen 4th Generation wRfx: NONREACTIVE

## 2023-10-03 LAB — RPR, QUANT+TP ABS (REFLEX)
Rapid Plasma Reagin, Quant: 1:64 {titer} — ABNORMAL HIGH
T Pallidum Abs: REACTIVE — AB

## 2023-10-03 LAB — RPR: RPR Ser Ql: REACTIVE — AB

## 2023-10-03 MED ORDER — DOXYCYCLINE HYCLATE 100 MG PO TABS: 100.0000 mg | ORAL_TABLET | Freq: Two times a day (BID) | ORAL | 0 refills | Status: DC

## 2023-10-03 NOTE — Telephone Encounter (Signed)
 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  Laymon JINNY Legions, MD

## 2023-10-08 LAB — CYTOLOGY - PAP
Chlamydia: NEGATIVE
Comment: NEGATIVE
Comment: NEGATIVE
Comment: NEGATIVE
Comment: NORMAL
Diagnosis: NEGATIVE
Diagnosis: REACTIVE
High risk HPV: NEGATIVE
Neisseria Gonorrhea: NEGATIVE
Trichomonas: NEGATIVE

## 2023-10-23 ENCOUNTER — Ambulatory Visit (HOSPITAL_COMMUNITY): Payer: MEDICAID

## 2023-10-23 ENCOUNTER — Encounter (HOSPITAL_COMMUNITY): Payer: Self-pay

## 2023-10-24 ENCOUNTER — Ambulatory Visit (INDEPENDENT_AMBULATORY_CARE_PROVIDER_SITE_OTHER): Payer: MEDICAID

## 2023-10-24 ENCOUNTER — Encounter (HOSPITAL_COMMUNITY): Payer: Self-pay

## 2023-10-24 VITALS — BP 129/90 | HR 89 | Wt 255.2 lb

## 2023-10-24 DIAGNOSIS — F200 Paranoid schizophrenia: Secondary | ICD-10-CM

## 2023-10-24 MED ORDER — HALOPERIDOL DECANOATE 50 MG/ML IM SOLN
50.0000 mg | Freq: Once | INTRAMUSCULAR | Status: AC
Start: 2023-10-24 — End: 2023-11-21
  Administered 2023-11-21: 50 mg via INTRAMUSCULAR

## 2023-10-24 NOTE — Progress Notes (Signed)
 Patient arrived for her injection of Haldol  100mg  injection that was supplied by her pharmacy. Patient was pleasant, cooperative with a bright affect. Walking and states she has not been stressed and has been doing great. Injection prepared as ordered and given in LEFT Deltoid without issue or complaint. Denies SI/HI or AV hallucinations.

## 2023-11-04 ENCOUNTER — Encounter: Payer: Self-pay | Admitting: Family Medicine

## 2023-11-04 DIAGNOSIS — R32 Unspecified urinary incontinence: Secondary | ICD-10-CM

## 2023-11-06 NOTE — Telephone Encounter (Signed)
 Community message sent for processing.

## 2023-11-06 NOTE — Telephone Encounter (Signed)
 Confirmation received.

## 2023-11-07 ENCOUNTER — Ambulatory Visit: Payer: MEDICAID

## 2023-11-21 ENCOUNTER — Ambulatory Visit (INDEPENDENT_AMBULATORY_CARE_PROVIDER_SITE_OTHER): Payer: MEDICAID

## 2023-11-21 ENCOUNTER — Ambulatory Visit (INDEPENDENT_AMBULATORY_CARE_PROVIDER_SITE_OTHER): Payer: MEDICAID | Admitting: Family Medicine

## 2023-11-21 ENCOUNTER — Encounter (HOSPITAL_COMMUNITY): Payer: Self-pay

## 2023-11-21 ENCOUNTER — Other Ambulatory Visit: Payer: Self-pay | Admitting: Family Medicine

## 2023-11-21 ENCOUNTER — Ambulatory Visit (INDEPENDENT_AMBULATORY_CARE_PROVIDER_SITE_OTHER): Payer: MEDICAID | Admitting: Psychiatry

## 2023-11-21 VITALS — BP 128/88 | HR 93 | Wt 252.2 lb

## 2023-11-21 VITALS — BP 118/89 | HR 70 | Ht 62.0 in | Wt 245.0 lb

## 2023-11-21 DIAGNOSIS — F4310 Post-traumatic stress disorder, unspecified: Secondary | ICD-10-CM | POA: Diagnosis not present

## 2023-11-21 DIAGNOSIS — F17210 Nicotine dependence, cigarettes, uncomplicated: Secondary | ICD-10-CM

## 2023-11-21 DIAGNOSIS — F319 Bipolar disorder, unspecified: Secondary | ICD-10-CM

## 2023-11-21 DIAGNOSIS — F200 Paranoid schizophrenia: Secondary | ICD-10-CM

## 2023-11-21 DIAGNOSIS — F411 Generalized anxiety disorder: Secondary | ICD-10-CM

## 2023-11-21 DIAGNOSIS — N889 Noninflammatory disorder of cervix uteri, unspecified: Secondary | ICD-10-CM

## 2023-11-21 DIAGNOSIS — F639 Impulse disorder, unspecified: Secondary | ICD-10-CM | POA: Diagnosis not present

## 2023-11-21 MED ORDER — GABAPENTIN 300 MG PO CAPS: 300.0000 mg | ORAL_CAPSULE | Freq: Three times a day (TID) | ORAL | 1 refills | Status: AC

## 2023-11-21 MED ORDER — DIVALPROEX SODIUM 250 MG PO DR TAB: 250.0000 mg | DELAYED_RELEASE_TABLET | Freq: Two times a day (BID) | ORAL | 0 refills | Status: AC

## 2023-11-21 MED ORDER — NICOTINE 21 MG/24HR TD PT24: 21.0000 mg | MEDICATED_PATCH | Freq: Every day | TRANSDERMAL | 1 refills | Status: DC

## 2023-11-21 MED ORDER — HALOPERIDOL DECANOATE 100 MG/ML IM SOLN
100.0000 mg | INTRAMUSCULAR | 5 refills | Status: AC
Start: 2023-11-21 — End: 2024-05-19

## 2023-11-21 NOTE — Patient Instructions (Signed)
 Colposcopy, Care After  The following information offers guidance on how to care for yourself after your procedure. Your doctor may also give you more specific instructions. If you have problems or questions, contact your doctor. What can I expect after the procedure? If you did not have a sample of your tissue taken out (did not have a biopsy), you may only have some spotting of blood for a few days. You can go back to your normal activities. If you had a sample of your tissue taken out, it is common to have: Soreness and mild pain. These may last for a few days. Mild bleeding or fluid (discharge) coming from your vagina. The fluid will look dark and grainy. You may have this for a few days. The fluid may be caused by a liquid that was used during your procedure. You may need to wear a sanitary pad. Spotting of blood for at least 48 hours after the procedure. Follow these instructions at home: Medicines Take over-the-counter and prescription medicines only as told by your doctor. Ask your doctor what over-the-counter pain medicines and prescription medicines you can start taking again. This is very important if you take blood thinners. Activity For at least 3 days, or for as long as told by your doctor, avoid: Douching. Using tampons. Having sex. Return to your normal activities as told by your doctor. Ask your doctor what activities are safe for you. General instructions Ask your doctor if you may take baths, swim, or use a hot tub. You may take showers. If you use birth control (contraception), keep using it. Keep all follow-up visits. Contact a doctor if: You have a fever or chills. You faint or feel light-headed. Get help right away if: You bleed a lot from your vagina. A lot of bleeding means that the bleeding soaks through a pad in less than 1 hour. You have clumps of blood (blood clots) coming from your vagina. You have signs that could mean you have an infection. This may be  fluid coming from your vagina that is: Different than normal. Yellow. Bad-smelling. You have very bad pain or cramps in your lower belly that do not get better with medicine. Summary If you did not have a sample of your tissue taken out, you may only have some spotting of blood for a few days. You can go back to your normal activities. If you had a sample of your tissue taken out, it is common to have mild pain for a few days and spotting for 48 hours. Avoid douching, using tampons, and having sex for at least 3 days after the procedure or for as long as told. Get help right away if you have a lot of bleeding, very bad pain, or signs of infection. This information is not intended to replace advice given to you by your health care provider. Make sure you discuss any questions you have with your health care provider. Document Revised: 05/22/2020 Document Reviewed: 05/22/2020 Elsevier Patient Education  2024 [employer].
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
## 2023-11-21 NOTE — Progress Notes (Cosign Needed)
 Pt presents today for injection of Haldol  100 Mg/ ml. Pt tolerated injection well R-deltiod, with no complaints.    JNL,CMA   Pt presents to be well groomed and has a very sweet personality. She is very kind and is doing great on her medication.  Pt denies any AVH, SI, and HI.

## 2023-11-22 ENCOUNTER — Ambulatory Visit: Payer: Self-pay | Admitting: Family Medicine

## 2023-11-22 LAB — SURGICAL PATHOLOGY

## 2023-11-25 NOTE — Telephone Encounter (Signed)
 I called the number listed on file to discuss cervical biopsy result. Call went straight to voice mail > voicemail full and I was unable to leave a message. Result letter mailed. Also messaged via MyChart.

## 2023-11-29 ENCOUNTER — Ambulatory Visit: Payer: MEDICAID

## 2023-11-29 NOTE — Progress Notes (Deleted)
    SUBJECTIVE:   CHIEF COMPLAINT / HPI:   Syphilis-treated in 09/2023 with doxycycline  100 mg twice daily x 14 days.  Needs repeat RPR testing at 04/27/2024 and 10/27/2024.  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  Needs repeat Pap with HPV testing in 11/2023.  PERTINENT  PMH / PSH: ***  OBJECTIVE:   There were no vitals taken for this visit. ***  General: NAD, pleasant, able to participate in exam Cardiac: RRR, no murmurs. Respiratory: CTAB, normal effort, No wheezes, rales or rhonchi Abdomen: Bowel sounds present, nontender, nondistended Extremities: no edema or cyanosis. Skin: warm and dry, no rashes noted Neuro: alert, no obvious focal deficits Psych: Normal affect and mood  ASSESSMENT/PLAN:   No problem-specific Assessment & Plan notes found for this encounter.     Dr. Izetta Nap, DO [facility]    {    This will disappear when note is signed, click to select method of visit    :1}

## 2023-12-16 ENCOUNTER — Ambulatory Visit: Payer: MEDICAID | Admitting: Family Medicine

## 2023-12-16 ENCOUNTER — Encounter: Payer: Self-pay | Admitting: Family Medicine

## 2023-12-16 VITALS — BP 128/97 | HR 88 | Ht 62.0 in | Wt 251.6 lb

## 2023-12-16 DIAGNOSIS — Z23 Encounter for immunization: Secondary | ICD-10-CM

## 2023-12-16 MED ORDER — FLUTICASONE PROPIONATE HFA 44 MCG/ACT IN AERO: 2.0000 | INHALATION_SPRAY | Freq: Two times a day (BID) | RESPIRATORY_TRACT | 3 refills | Status: AC

## 2023-12-16 MED ORDER — MONTELUKAST SODIUM 10 MG PO TABS: 10.0000 mg | ORAL_TABLET | Freq: Every day | ORAL | 1 refills | Status: AC

## 2023-12-16 MED ORDER — ALBUTEROL SULFATE HFA 108 (90 BASE) MCG/ACT IN AERS: 2.0000 | INHALATION_SPRAY | RESPIRATORY_TRACT | 3 refills | Status: AC | PRN

## 2023-12-16 NOTE — Patient Instructions (Signed)
 It was great to see you again today.  Pneumonia shot today Refilled albuterol , flovent , and singulair   We will repeat labs in April  Be well, Dr. Donah

## 2023-12-16 NOTE — Progress Notes (Unsigned)
  Date of Visit: 12/16/2023   SUBJECTIVE:   HPI:  Discussed the use of AI scribe software for clinical note transcription with the patient, who gave verbal consent to proceed.  History of Present Illness       OBJECTIVE:   BP (!) 128/97   Pulse 88   Ht 5' 2 (1.575 m)   Wt 251 lb 9.6 oz (114.1 kg)   SpO2 97%   BMI 46.02 kg/m  Gen: *** HEENT: *** Heart: *** Lungs: *** Neuro: *** Ext: ***  ASSESSMENT/PLAN:   Assessment & Plan      Assessment and Plan Assessment & Plan       FOLLOW UP: Follow up in *** for ***  Gwendy Boeder J. Donah, MD [facility]

## 2023-12-18 NOTE — Assessment & Plan Note (Signed)
 Completed treatment with PO doxycycline  due to PCN allergy; with some gastrointestinal upset during PO treatment. No new sexual partners.  - repeat RPR in April 2026 and October 2026

## 2023-12-18 NOTE — Assessment & Plan Note (Signed)
 Increased albuterol  use and frequent coughing. Out of Flovent . - Refilled albuterol  inhaler. - Refilled Flovent . - Refilled Singulair .

## 2023-12-18 NOTE — Assessment & Plan Note (Signed)
Prevnar 20 given today. 

## 2023-12-19 ENCOUNTER — Other Ambulatory Visit (HOSPITAL_COMMUNITY): Payer: Self-pay | Admitting: Psychiatry

## 2023-12-19 ENCOUNTER — Encounter (HOSPITAL_COMMUNITY): Payer: Self-pay

## 2023-12-19 ENCOUNTER — Ambulatory Visit (INDEPENDENT_AMBULATORY_CARE_PROVIDER_SITE_OTHER): Payer: MEDICAID

## 2023-12-19 VITALS — BP 125/88 | HR 82 | Wt 251.8 lb

## 2023-12-19 DIAGNOSIS — F200 Paranoid schizophrenia: Secondary | ICD-10-CM

## 2023-12-19 DIAGNOSIS — F411 Generalized anxiety disorder: Secondary | ICD-10-CM

## 2023-12-19 MED ORDER — CLONIDINE HCL 0.1 MG PO TABS: 0.1000 mg | ORAL_TABLET | Freq: Every day | ORAL | 3 refills | Status: AC

## 2023-12-19 NOTE — Progress Notes (Signed)
 Pt presents today for injection of Haldol  100 Mg/ ml. Pt tolerated injection well  L-deltiod, with no complaints.

## 2024-01-04 ENCOUNTER — Other Ambulatory Visit: Payer: Self-pay | Admitting: Family Medicine

## 2024-01-20 ENCOUNTER — Ambulatory Visit: Payer: MEDICAID | Admitting: Family Medicine

## 2024-01-20 VITALS — BP 111/89 | HR 98 | Temp 99.5°F | Ht 62.0 in | Wt 243.0 lb

## 2024-01-20 DIAGNOSIS — K5904 Chronic idiopathic constipation: Secondary | ICD-10-CM

## 2024-01-20 DIAGNOSIS — R159 Full incontinence of feces: Secondary | ICD-10-CM

## 2024-01-20 DIAGNOSIS — R109 Unspecified abdominal pain: Secondary | ICD-10-CM

## 2024-01-20 NOTE — Progress Notes (Signed)
" ° °  SUBJECTIVE:   CHIEF COMPLAINT / HPI:  Discussed the use of AI scribe software for clinical note transcription with the patient, who gave verbal consent to proceed.  History of Present Illness Lisa [name] is a 29 year old female who presents with abdominal pain and diarrhea. She is accompanied by her mother.  Abdominal pain - Stabbing pain present since Thursday - Primarily occurs at night but also during the day - Severe intensity, causing inability to sleep  Diarrhea and constipation  - Onset since Thursday - Frequency of 3 to 4 bowel movements per day - Stool consistency varies from runny to hard - Requires assistance with hygiene due to inconsistent stool and intellectual disability  - No blood in stool  Associated gastrointestinal symptoms - No vomiting - No nausea - Decreased oral intake  Bowel habits baseline - Typically has one hard bowel movement daily with straining    PERTINENT  PMH / PSH: Intellectual disability   OBJECTIVE:  BP 111/89   Pulse 98   Temp 99.5 F (37.5 C) (Oral)   Ht 5' 2 (1.575 m)   Wt 243 lb (110.2 kg)   LMP 01/10/2024   SpO2 100%   BMI 44.45 kg/m   General: well appearing, in no acute distress Resp: Normal work of breathing on room air Abd: Soft, non distended, palpable soft masses in abdomen with tenderness, no guarding  Neuro: Alert & Oriented, slowed responses   ASSESSMENT/PLAN:   Assessment & Plan Abdominal pain, unspecified abdominal location Chronic idiopathic constipation Encopresis Acute constipation with overflow diarrhea due to hard stool blockage. - Administer Miralax  2-3 times daily for one week, then daily for maintenance. - Use Tylenol  or ibuprofen  for pain management. - Apply heating pad for symptomatic relief. - Follow-up in one week to reassess.   Areta Saliva, MD [facility] "

## 2024-01-20 NOTE — Patient Instructions (Signed)
 It was wonderful to see you today.  Please bring ALL of your medications with you to every visit.    VISIT SUMMARY: Today, you came in with abdominal pain and diarrhea that started on Thursday. Your pain is severe and has been affecting your sleep. You also mentioned having 3 to 4 bowel movements per day with varying stool consistency.  YOUR PLAN: -CONSTIPATION: You have acute constipation with overflow diarrhea, which means that hard stool is blocking your bowel and causing diarrhea. To help with this, you should take Miralax  2-3 times daily for one week, then once daily for maintenance. For pain, you can use Tylenol  or ibuprofen  and apply a heating pad for relief.  INSTRUCTIONS: Please follow up in one week so we can reassess your condition. Follow up on January 22nd with PCP at 8:50 AM.   Contains text generated by [employer].   Thank you for choosing [facility].   Please call [phone] with any questions about today's appointment.   Areta Saliva, MD  Family Medicine

## 2024-01-21 ENCOUNTER — Ambulatory Visit (INDEPENDENT_AMBULATORY_CARE_PROVIDER_SITE_OTHER): Payer: MEDICAID

## 2024-01-21 VITALS — BP 113/71 | HR 92 | Ht 62.0 in | Wt 242.6 lb

## 2024-01-21 DIAGNOSIS — F639 Impulse disorder, unspecified: Secondary | ICD-10-CM | POA: Diagnosis not present

## 2024-01-21 DIAGNOSIS — F319 Bipolar disorder, unspecified: Secondary | ICD-10-CM | POA: Diagnosis not present

## 2024-01-21 MED ORDER — HALOPERIDOL DECANOATE 100 MG/ML IM SOLN
100.0000 mg | INTRAMUSCULAR | Status: AC
  Administered 2024-01-21: 100 mg via INTRAMUSCULAR

## 2024-01-21 NOTE — Progress Notes (Cosign Needed)
 Patient presented to office with her mother for Haldol  100 mg injection. Patient denies SI/HI/AVH. Patient received injection in right deltoid. Patient tolerated injection well. Patient will return in 28 days. Patient left clinic alert and ambulatory.

## 2024-01-30 ENCOUNTER — Other Ambulatory Visit (HOSPITAL_COMMUNITY): Payer: Self-pay

## 2024-01-30 ENCOUNTER — Ambulatory Visit: Payer: MEDICAID | Admitting: Family Medicine

## 2024-01-30 ENCOUNTER — Encounter: Payer: Self-pay | Admitting: Family Medicine

## 2024-01-30 VITALS — BP 108/76 | HR 89 | Ht 62.0 in | Wt 248.8 lb

## 2024-01-30 DIAGNOSIS — K5900 Constipation, unspecified: Secondary | ICD-10-CM | POA: Diagnosis not present

## 2024-01-30 DIAGNOSIS — I10 Essential (primary) hypertension: Secondary | ICD-10-CM

## 2024-01-30 DIAGNOSIS — J4540 Moderate persistent asthma, uncomplicated: Secondary | ICD-10-CM | POA: Diagnosis not present

## 2024-01-30 DIAGNOSIS — Z9109 Other allergy status, other than to drugs and biological substances: Secondary | ICD-10-CM | POA: Diagnosis not present

## 2024-01-30 DIAGNOSIS — A515 Early syphilis, latent: Secondary | ICD-10-CM

## 2024-01-30 MED ORDER — FLUTICASONE PROPIONATE 50 MCG/ACT NA SUSP
1.0000 | Freq: Every day | NASAL | 2 refills | Status: AC
  Filled 2024-01-30: qty 16, 60d supply, fill #0

## 2024-01-30 NOTE — Patient Instructions (Signed)
 It was great to see you again today.  Continue miralax  daily as needed  Follow up in April to repeat syphilis labs Next pap in September  Be well, Dr. Donah

## 2024-01-30 NOTE — Assessment & Plan Note (Signed)
 Well controlled. Continue current medication regimen.

## 2024-01-30 NOTE — Assessment & Plan Note (Signed)
 Follow up April for next RPR

## 2024-01-30 NOTE — Assessment & Plan Note (Signed)
 Asthma well-controlled - Continue current asthma management with albuterol  as needed and Flovent  two puffs twice a day. - Refilled Flonase  nasal spray.

## 2024-01-30 NOTE — Progress Notes (Signed)
"  °  Date of Visit: 01/30/2024   SUBJECTIVE:   HPI:  Discussed the use of AI scribe software for clinical note transcription with the patient, who gave verbal consent to proceed.  History of Present Illness Lisa [name] is a 29 year old female who presents for follow-up on abdominal pain and diarrhea.  Gastrointestinal symptoms - Initial presentation on January 20, 2024 with diarrhea and hard stools, attributed to constipation with stool leakage. - Significant abdominal discomfort, described as severe enough to prevent sleep at night. - Treated with Miralax  twice daily for one week, then reduced to once daily. - Currently has significant improvement with normal bowel movements, no straining, and no diarrhea.  Sexual health and infectious disease follow-up - No recent sexual partners since last testing. - Due for follow-up RPR titer in April  Asthma/Allergies - does well, not requiring albuterol  unless walking up stairs - taking flovent  2p twice daily  - needs refill of flonase   OBJECTIVE:   BP 108/76   Pulse 89   Ht 5' 2 (1.575 m)   Wt 248 lb 12.8 oz (112.9 kg)   LMP 01/10/2024   SpO2 100%   BMI 45.51 kg/m  Gen: NAD, pleasant, cooperative HEENT: NCAT Heart: RRR, no murmurs Lungs: CTAB, normal work of breathing Abdomen: soft, nontender to palpation, no masses Neuro: grossly nonfocal, speech normal, follows commands, alert and oriented   ASSESSMENT/PLAN:   Assessment & Plan Moderate persistent asthma, unspecified whether complicated Asthma well-controlled - Continue current asthma management with albuterol  as needed and Flovent  two puffs twice a day. - Refilled Flonase  nasal spray. Syphilis, early latent Follow up April for next RPR Hypertension, unspecified type Well controlled. Continue current medication regimen.  Constipation, unspecified constipation type Symptoms improved with Miralax ; no current diarrhea or hard stools. - Continue Miralax  as needed for  constipation management.   FOLLOW UP: Follow up in April for RPR Pap due September  Jourdyn Ferrin J. Donah, MD [facility] "

## 2024-01-30 NOTE — Assessment & Plan Note (Signed)
 Symptoms improved with Miralax ; no current diarrhea or hard stools. - Continue Miralax  as needed for constipation management.

## 2024-02-13 ENCOUNTER — Other Ambulatory Visit (HOSPITAL_COMMUNITY): Payer: Self-pay

## 2024-02-18 ENCOUNTER — Ambulatory Visit (HOSPITAL_COMMUNITY): Payer: MEDICAID
